# Patient Record
Sex: Female | Born: 1935 | Race: White | Hispanic: No | Marital: Married | Smoking: Never smoker
Health system: Southern US, Community
[De-identification: ages and names within clinical notes are randomized; demographics above are authoritative.]

## PROBLEM LIST (undated history)

## (undated) DIAGNOSIS — E785 Hyperlipidemia, unspecified: Secondary | ICD-10-CM

## (undated) DIAGNOSIS — I10 Essential (primary) hypertension: Secondary | ICD-10-CM

## (undated) DIAGNOSIS — S72143A Displaced intertrochanteric fracture of unspecified femur, initial encounter for closed fracture: Secondary | ICD-10-CM

## (undated) DIAGNOSIS — E079 Disorder of thyroid, unspecified: Secondary | ICD-10-CM

## (undated) HISTORY — DX: Hyperlipidemia, unspecified: E78.5

## (undated) HISTORY — PX: ORIF TIBIA & FIBULA FRACTURES: SHX2131

## (undated) HISTORY — PX: OTHER SURGICAL HISTORY: SHX169

## (undated) HISTORY — PX: PARTIAL HYSTERECTOMY: SHX80

## (undated) HISTORY — PX: FRACTURE SURGERY: SHX138

## (undated) HISTORY — PX: APPENDECTOMY: SHX54

## (undated) HISTORY — PX: TONSILLECTOMY AND ADENOIDECTOMY: SUR1326

## (undated) HISTORY — PX: SHOULDER ARTHROSCOPY: SHX128

## (undated) HISTORY — DX: Essential (primary) hypertension: I10

## (undated) HISTORY — PX: HEMORROIDECTOMY: SUR656

## (undated) HISTORY — DX: Disorder of thyroid, unspecified: E07.9

---

## 1999-07-12 ENCOUNTER — Encounter: Admission: RE | Admit: 1999-07-12 | Discharge: 1999-07-12 | Payer: Self-pay | Admitting: Cardiology

## 1999-11-08 ENCOUNTER — Emergency Department (HOSPITAL_COMMUNITY): Admission: EM | Admit: 1999-11-08 | Discharge: 1999-11-08 | Payer: Self-pay | Admitting: Emergency Medicine

## 1999-11-08 ENCOUNTER — Encounter: Payer: Self-pay | Admitting: Emergency Medicine

## 1999-11-14 ENCOUNTER — Encounter: Admission: RE | Admit: 1999-11-14 | Discharge: 1999-11-29 | Payer: Self-pay | Admitting: Orthopedic Surgery

## 2000-07-16 ENCOUNTER — Encounter: Payer: Self-pay | Admitting: Cardiology

## 2000-07-16 ENCOUNTER — Encounter: Admission: RE | Admit: 2000-07-16 | Discharge: 2000-07-16 | Payer: Self-pay | Admitting: Cardiology

## 2001-07-18 ENCOUNTER — Encounter: Payer: Self-pay | Admitting: Cardiology

## 2001-07-18 ENCOUNTER — Encounter: Admission: RE | Admit: 2001-07-18 | Discharge: 2001-07-18 | Payer: Self-pay | Admitting: Cardiology

## 2001-08-31 ENCOUNTER — Emergency Department (HOSPITAL_COMMUNITY): Admission: EM | Admit: 2001-08-31 | Discharge: 2001-08-31 | Payer: Self-pay | Admitting: Emergency Medicine

## 2001-09-07 ENCOUNTER — Encounter: Payer: Self-pay | Admitting: Neurology

## 2001-09-07 ENCOUNTER — Ambulatory Visit (HOSPITAL_COMMUNITY): Admission: RE | Admit: 2001-09-07 | Discharge: 2001-09-07 | Payer: Self-pay | Admitting: Neurology

## 2001-09-11 ENCOUNTER — Ambulatory Visit (HOSPITAL_COMMUNITY): Admission: RE | Admit: 2001-09-11 | Discharge: 2001-09-11 | Payer: Self-pay | Admitting: Neurology

## 2004-01-21 ENCOUNTER — Ambulatory Visit: Admission: RE | Admit: 2004-01-21 | Discharge: 2004-01-21 | Payer: Self-pay | Admitting: Plastic Surgery

## 2004-01-21 ENCOUNTER — Ambulatory Visit (HOSPITAL_BASED_OUTPATIENT_CLINIC_OR_DEPARTMENT_OTHER): Admission: RE | Admit: 2004-01-21 | Discharge: 2004-01-21 | Payer: Self-pay | Admitting: Plastic Surgery

## 2004-01-21 ENCOUNTER — Encounter (INDEPENDENT_AMBULATORY_CARE_PROVIDER_SITE_OTHER): Payer: Self-pay | Admitting: *Deleted

## 2004-05-16 ENCOUNTER — Encounter (INDEPENDENT_AMBULATORY_CARE_PROVIDER_SITE_OTHER): Payer: Self-pay | Admitting: Specialist

## 2004-05-16 ENCOUNTER — Ambulatory Visit (HOSPITAL_COMMUNITY): Admission: RE | Admit: 2004-05-16 | Discharge: 2004-05-16 | Payer: Self-pay | Admitting: Gastroenterology

## 2004-05-24 ENCOUNTER — Ambulatory Visit (HOSPITAL_COMMUNITY): Admission: RE | Admit: 2004-05-24 | Discharge: 2004-05-24 | Payer: Self-pay | Admitting: Plastic Surgery

## 2004-05-24 ENCOUNTER — Encounter (INDEPENDENT_AMBULATORY_CARE_PROVIDER_SITE_OTHER): Payer: Self-pay | Admitting: *Deleted

## 2004-05-24 ENCOUNTER — Ambulatory Visit (HOSPITAL_BASED_OUTPATIENT_CLINIC_OR_DEPARTMENT_OTHER): Admission: RE | Admit: 2004-05-24 | Discharge: 2004-05-24 | Payer: Self-pay | Admitting: Plastic Surgery

## 2004-08-02 ENCOUNTER — Encounter: Admission: RE | Admit: 2004-08-02 | Discharge: 2004-08-02 | Payer: Self-pay | Admitting: Cardiology

## 2004-08-10 ENCOUNTER — Encounter: Admission: RE | Admit: 2004-08-10 | Discharge: 2004-08-10 | Payer: Self-pay | Admitting: Cardiology

## 2004-08-10 ENCOUNTER — Encounter (INDEPENDENT_AMBULATORY_CARE_PROVIDER_SITE_OTHER): Payer: Self-pay | Admitting: *Deleted

## 2004-08-10 ENCOUNTER — Other Ambulatory Visit: Admission: RE | Admit: 2004-08-10 | Discharge: 2004-08-10 | Payer: Self-pay | Admitting: Diagnostic Radiology

## 2004-08-22 ENCOUNTER — Ambulatory Visit: Admission: RE | Admit: 2004-08-22 | Discharge: 2004-08-22 | Payer: Self-pay | Admitting: Orthopedic Surgery

## 2005-09-05 ENCOUNTER — Encounter: Admission: RE | Admit: 2005-09-05 | Discharge: 2005-09-05 | Payer: Self-pay | Admitting: Cardiology

## 2005-09-28 ENCOUNTER — Encounter: Admission: RE | Admit: 2005-09-28 | Discharge: 2005-09-28 | Payer: Self-pay | Admitting: Cardiology

## 2007-08-01 ENCOUNTER — Encounter: Admission: RE | Admit: 2007-08-01 | Discharge: 2007-08-01 | Payer: Self-pay | Admitting: Cardiology

## 2007-08-30 ENCOUNTER — Encounter: Admission: RE | Admit: 2007-08-30 | Discharge: 2007-08-30 | Payer: Self-pay | Admitting: Cardiology

## 2008-12-17 ENCOUNTER — Encounter: Payer: Self-pay | Admitting: Cardiology

## 2009-01-06 ENCOUNTER — Ambulatory Visit: Payer: Self-pay | Admitting: Infectious Diseases

## 2009-01-06 ENCOUNTER — Inpatient Hospital Stay (HOSPITAL_COMMUNITY): Admission: EM | Admit: 2009-01-06 | Discharge: 2009-01-08 | Payer: Self-pay | Admitting: Emergency Medicine

## 2009-01-07 ENCOUNTER — Encounter: Payer: Self-pay | Admitting: Infectious Diseases

## 2009-01-07 ENCOUNTER — Ambulatory Visit: Payer: Self-pay | Admitting: Surgery

## 2009-02-21 ENCOUNTER — Encounter: Payer: Self-pay | Admitting: Infectious Diseases

## 2009-11-08 ENCOUNTER — Encounter: Admission: RE | Admit: 2009-11-08 | Discharge: 2009-11-08 | Payer: Self-pay | Admitting: Cardiology

## 2009-11-12 ENCOUNTER — Encounter: Admission: RE | Admit: 2009-11-12 | Discharge: 2009-11-12 | Payer: Self-pay | Admitting: Cardiology

## 2010-06-08 ENCOUNTER — Ambulatory Visit: Payer: Self-pay | Admitting: Cardiology

## 2010-10-06 ENCOUNTER — Inpatient Hospital Stay (HOSPITAL_COMMUNITY)
Admission: EM | Admit: 2010-10-06 | Discharge: 2010-10-08 | Payer: Self-pay | Source: Home / Self Care | Attending: Internal Medicine | Admitting: Internal Medicine

## 2010-10-06 LAB — CBC
HCT: 41.5 % (ref 36.0–46.0)
Hemoglobin: 13.4 g/dL (ref 12.0–15.0)
MCH: 30.2 pg (ref 26.0–34.0)
MCHC: 32.3 g/dL (ref 30.0–36.0)
MCV: 93.5 fL (ref 78.0–100.0)
Platelets: 216 10*3/uL (ref 150–400)
RBC: 4.44 MIL/uL (ref 3.87–5.11)
RDW: 13.6 % (ref 11.5–15.5)
WBC: 7.3 10*3/uL (ref 4.0–10.5)

## 2010-10-06 LAB — POCT CARDIAC MARKERS
CKMB, poc: <1 ng/mL — ABNORMAL LOW (ref 1.0–8.0)
Myoglobin, poc: 61.2 ng/mL (ref 12–200)
Troponin i, poc: 0.18 ng/mL — ABNORMAL HIGH (ref 0.00–0.09)

## 2010-10-06 LAB — URINALYSIS, ROUTINE W REFLEX MICROSCOPIC
Bilirubin Urine: NEGATIVE
Hemoglobin, Urine: NEGATIVE
Ketones, ur: NEGATIVE mg/dL
Nitrite: NEGATIVE
Protein, ur: NEGATIVE mg/dL
Specific Gravity, Urine: 1.017 (ref 1.005–1.030)
Urine Glucose, Fasting: NEGATIVE mg/dL
Urobilinogen, UA: 0.2 mg/dL (ref 0.0–1.0)
pH: 6 (ref 5.0–8.0)

## 2010-10-06 LAB — POCT I-STAT, CHEM 8
BUN: 21 mg/dL (ref 6–23)
Calcium, Ion: 1.2 mmol/L (ref 1.12–1.32)
Chloride: 106 mEq/L (ref 96–112)
Creatinine, Ser: 0.8 mg/dL (ref 0.4–1.2)
Glucose, Bld: 101 mg/dL — ABNORMAL HIGH (ref 70–99)
HCT: 43 % (ref 36.0–46.0)
Hemoglobin: 14.6 g/dL (ref 12.0–15.0)
Potassium: 3.8 mEq/L (ref 3.5–5.1)
Sodium: 143 mEq/L (ref 135–145)
TCO2: 30 mmol/L (ref 0–100)

## 2010-10-06 LAB — DIFFERENTIAL
Basophils Absolute: 0 10*3/uL (ref 0.0–0.1)
Basophils Relative: 0 % (ref 0–1)
Eosinophils Absolute: 0.3 10*3/uL (ref 0.0–0.7)
Eosinophils Relative: 4 % (ref 0–5)
Lymphocytes Relative: 27 % (ref 12–46)
Lymphs Abs: 2 10*3/uL (ref 0.7–4.0)
Monocytes Absolute: 0.8 10*3/uL (ref 0.1–1.0)
Monocytes Relative: 11 % (ref 3–12)
Neutro Abs: 4.1 10*3/uL (ref 1.7–7.7)
Neutrophils Relative %: 57 % (ref 43–77)

## 2010-10-06 LAB — PROTIME-INR
INR: 0.98 (ref 0.00–1.49)
Prothrombin Time: 13.2 seconds (ref 11.6–15.2)

## 2010-10-07 ENCOUNTER — Encounter (INDEPENDENT_AMBULATORY_CARE_PROVIDER_SITE_OTHER): Payer: Self-pay | Admitting: Internal Medicine

## 2010-10-07 LAB — DIFFERENTIAL
Basophils Absolute: 0 10*3/uL (ref 0.0–0.1)
Basophils Relative: 0 % (ref 0–1)
Eosinophils Absolute: 0.2 10*3/uL (ref 0.0–0.7)
Eosinophils Relative: 3 % (ref 0–5)
Lymphocytes Relative: 23 % (ref 12–46)
Lymphs Abs: 1.8 10*3/uL (ref 0.7–4.0)
Monocytes Absolute: 0.6 10*3/uL (ref 0.1–1.0)
Monocytes Relative: 8 % (ref 3–12)
Neutro Abs: 5 10*3/uL (ref 1.7–7.7)
Neutrophils Relative %: 65 % (ref 43–77)

## 2010-10-07 LAB — CBC
HCT: 38.6 % (ref 36.0–46.0)
Hemoglobin: 12.5 g/dL (ref 12.0–15.0)
MCH: 30.4 pg (ref 26.0–34.0)
MCHC: 32.4 g/dL (ref 30.0–36.0)
MCV: 93.9 fL (ref 78.0–100.0)
Platelets: 204 10*3/uL (ref 150–400)
RBC: 4.11 MIL/uL (ref 3.87–5.11)
RDW: 13.7 % (ref 11.5–15.5)
WBC: 7.7 10*3/uL (ref 4.0–10.5)

## 2010-10-07 LAB — COMPREHENSIVE METABOLIC PANEL
ALT: 11 U/L (ref 0–35)
AST: 18 U/L (ref 0–37)
Albumin: 2.7 g/dL — ABNORMAL LOW (ref 3.5–5.2)
Alkaline Phosphatase: 78 U/L (ref 39–117)
BUN: 15 mg/dL (ref 6–23)
CO2: 22 mEq/L (ref 19–32)
Calcium: 8.6 mg/dL (ref 8.4–10.5)
Chloride: 110 mEq/L (ref 96–112)
Creatinine, Ser: 0.61 mg/dL (ref 0.4–1.2)
GFR calc Af Amer: >60 mL/min (ref >60)
GFR calc non Af Amer: >60 mL/min (ref >60)
Glucose, Bld: 101 mg/dL — ABNORMAL HIGH (ref 70–99)
Potassium: 4.1 mEq/L (ref 3.5–5.1)
Sodium: 142 mEq/L (ref 135–145)
Total Bilirubin: 0.5 mg/dL (ref 0.3–1.2)
Total Protein: 5.6 g/dL — ABNORMAL LOW (ref 6.0–8.3)

## 2010-10-07 LAB — CK TOTAL AND CKMB (NOT AT ARMC)
CK, MB: 1 ng/mL (ref 0.3–4.0)
Relative Index: INVALID (ref 0.0–2.5)
Total CK: 31 U/L (ref 7–177)

## 2010-10-07 LAB — CARDIAC PANEL(CRET KIN+CKTOT+MB+TROPI)
CK, MB: 1 ng/mL (ref 0.3–4.0)
CK, MB: 1.2 ng/mL (ref 0.3–4.0)
Relative Index: INVALID (ref 0.0–2.5)
Relative Index: INVALID (ref 0.0–2.5)
Total CK: 31 U/L (ref 7–177)
Total CK: 30 U/L (ref 7–177)
Troponin I: 0.01 ng/mL (ref 0.00–0.06)
Troponin I: 0.01 ng/mL (ref 0.00–0.06)

## 2010-10-07 LAB — MAGNESIUM: Magnesium: 2.1 mg/dL (ref 1.5–2.5)

## 2010-10-07 LAB — TSH: TSH: 6.174 u[IU]/mL — ABNORMAL HIGH (ref 0.350–4.500)

## 2010-10-07 LAB — LIPASE, BLOOD: Lipase: 135 U/L — ABNORMAL HIGH (ref 11–59)

## 2010-10-07 LAB — LIPID PANEL
Cholesterol: 143 mg/dL (ref 0–200)
HDL: 35 mg/dL — ABNORMAL LOW (ref >39)
LDL Cholesterol: 92 mg/dL (ref 0–99)
Total CHOL/HDL Ratio: 4.1 RATIO
Triglycerides: 78 mg/dL (ref <150)
VLDL: 16 mg/dL (ref 0–40)

## 2010-10-07 LAB — TROPONIN I: Troponin I: 0.02 ng/mL (ref 0.00–0.06)

## 2010-10-07 LAB — PHOSPHORUS: Phosphorus: 2.9 mg/dL (ref 2.3–4.6)

## 2010-10-07 LAB — T4, FREE: Free T4: 0.72 ng/dL — ABNORMAL LOW (ref 0.80–1.80)

## 2010-10-11 ENCOUNTER — Ambulatory Visit: Payer: Self-pay | Admitting: Cardiology

## 2010-10-17 LAB — AMYLASE: Amylase: 82 U/L (ref 0–105)

## 2010-10-17 LAB — CBC
HCT: 39.2 % (ref 36.0–46.0)
Hemoglobin: 12.5 g/dL (ref 12.0–15.0)
MCH: 29.8 pg (ref 26.0–34.0)
MCHC: 31.9 g/dL (ref 30.0–36.0)
MCV: 93.3 fL (ref 78.0–100.0)
Platelets: 198 10*3/uL (ref 150–400)
RBC: 4.2 MIL/uL (ref 3.87–5.11)
RDW: 13.8 % (ref 11.5–15.5)
WBC: 8.4 10*3/uL (ref 4.0–10.5)

## 2010-10-17 LAB — CARDIAC PANEL(CRET KIN+CKTOT+MB+TROPI)
CK, MB: 1.1 ng/mL (ref 0.3–4.0)
CK, MB: 1.2 ng/mL (ref 0.3–4.0)
Relative Index: INVALID (ref 0.0–2.5)
Relative Index: 1 (ref 0.0–2.5)
Total CK: 26 U/L (ref 7–177)
Total CK: 122 U/L (ref 7–177)
Troponin I: 0.01 ng/mL (ref 0.00–0.06)
Troponin I: 0.02 ng/mL (ref 0.00–0.06)

## 2010-10-17 LAB — COMPREHENSIVE METABOLIC PANEL
ALT: 13 U/L (ref 0–35)
AST: 15 U/L (ref 0–37)
Albumin: 3 g/dL — ABNORMAL LOW (ref 3.5–5.2)
Alkaline Phosphatase: 84 U/L (ref 39–117)
BUN: 7 mg/dL (ref 6–23)
CO2: 23 mEq/L (ref 19–32)
Calcium: 8.5 mg/dL (ref 8.4–10.5)
Chloride: 104 mEq/L (ref 96–112)
Creatinine, Ser: 0.53 mg/dL (ref 0.4–1.2)
GFR calc Af Amer: >60 mL/min (ref >60)
GFR calc non Af Amer: >60 mL/min (ref >60)
Glucose, Bld: 110 mg/dL — ABNORMAL HIGH (ref 70–99)
Potassium: 3.2 mEq/L — ABNORMAL LOW (ref 3.5–5.1)
Sodium: 135 mEq/L (ref 135–145)
Total Bilirubin: 0.5 mg/dL (ref 0.3–1.2)
Total Protein: 6.4 g/dL (ref 6.0–8.3)

## 2010-10-17 LAB — URINE CULTURE
Colony Count: NO GROWTH
Culture  Setup Time: 201201052014
Culture: NO GROWTH

## 2010-10-17 LAB — LIPASE, BLOOD: Lipase: 28 U/L (ref 11–59)

## 2010-10-23 ENCOUNTER — Encounter: Payer: Self-pay | Admitting: Cardiology

## 2010-10-27 NOTE — H&P (Addendum)
NAME:  Lauren Moreno, Lauren Moreno NO.:  [id number]  MEDICAL RECORD NO.:  [id number]          PATIENT TYPE:  EMS  LOCATION:  MAJO                         FACILITY:  [facility]  PHYSICIAN:  Rock Nephew, MD       DATE OF BIRTH:  07-20-1928  DATE OF ADMISSION:  10/06/2010 DATE OF DISCHARGE:                             HISTORY & PHYSICAL   PRIMARY CARE PHYSICIAN AND CARDIOLOGIST:  Dr. Cassell Clement.  CHIEF COMPLAINT:  Fall.  HISTORY OF PRESENT ILLNESS:  An 77 year old female comes in with a chief complaint of fall.  This is an 77 year old female history of syncope as well as angina per patient and a history of vertigo comes in with chief complaint of a fall.  The patient reports that she was walking from one room to the next and she lost her balance and she fell.  The patient reported that she felt lightheaded before she fell.  She denied any unusual chest pain or shortness of breath before falling.  The patientrecently had has had some abdominal pain and nausea.  She denies any fevers or chills.  The patient did report to me that she had some chest pain with no shortness breath, no nausea, and no left arm radiation. The patient reports that she normally has chest pain and she has a history of angina.  Also of note, the patient had a CT scan of the head and C-spine . CT of the head was no intracranial hemorrhage and no CT evidence of large acute infarct.  CT of the C-spine showed no cervical spine fracture, scattered mild degenerative changes, most notably C5-C6. The patient also had two-view abdominal x-ray, which showed nonspecific nonobstructive gas bowel pattern.  PAST MEDICAL HISTORY:  History of syncope, history of angina per patient.  SURGICAL HISTORY:  History of partial hysterectomy, history of appendectomy with a partial hysterectomy.  SOCIAL HISTORY:  Nonsmoker, non drinker.  No drug abuse.  The patient lives by herself.  The patient's healthcare decision maker  is his her son and daughter both.  The spokesperson is going to be Gene Bushnell who is the son, phone number [phone].  The patient reports that she has not fallen in the last 6 months.  ALLERGIES:  PENICILLIN.  The patient reported shortness of breath with PENICILLIN as allergy.  HOME MEDICATION:  She reports she takes: 1. Inderal. 2. As well as aspirin 325 mg.  REVIEW OF SYSTEMS:  No headaches.  The patient reports blurry vision. She reports chest pain.  She denies shortness of breath.  She denies any nausea right now but she had nausea the past.  She denies vomiting.  She denies abdominal pain right now but she has had abdominal pain in the past last 2-3 days.  She denied any diarrhea, constipation, burning on urination.  She reports occasional pain in her legs but not right now. Denies having any seizure-like activity or stroke-like activity.  PHYSICAL EXAMINATION:  VITAL SIGNS:  Temperature 97.0, blood pressure 141/54, pulse rate 70, respiratory rate 18, 100% saturation 2 liters nasal cannula. HEAD, EYES, EARS, NOSE AND THROAT:  Normocephalic, atraumatic.  Pupils equal,  round, reactive to light. CARDIOVASCULAR:  S1, S2.  Regular rate rhythm.  No murmurs or rubs. LUNGS:  Clear to auscultation bilaterally.  No wheezes or rhonchi. ABDOMEN:  Soft, nontender, nondistended.  Bowel sounds positive.  No guarding or rebound tenderness. EXTREMITIES:  No lower extremity edema is evident. NEUROLOGIC:  The patient's cranial nerves II-XII are grossly intact.  DIAGNOSTIC STUDIES:  The patient's 12-lead EKG shows normal sinus rhythm and no acute ST-T wave changes.  RADIOLOGICAL STUDIES:  Two-view abdominal x-rays nonspecific nonobstructive bowel gas pattern.  The patient's chest x-ray two-view shows stable mild bibasilar scarring and old lower thoracic vertebral body compression fractures.  The patient's x-ray of the pelvis shows a negative.  X-ray of the left femur was negative.   X-ray of the right femur was negative.  X-ray of the left ankle was negative.  X-ray of the right ankle was negative.  The patient's two-view x-ray which is nonspecific nonobstructive gas bowel pattern.  CT of the C-spine showed no cervical spine fracture, scattered mild degenerative changes most notable at C5-C6.  CT of the head which showed no acute intracranial hemorrhage or CT evidence of large acute infarct.  LABORATORY STUDIES:  Are as follows.  The patient WBC count is 7.3, hemoglobin is 14.6, hematocrit 43.0, MCV 93.5, platelets are 216,000, neutrophils 57, INR 0.98, sodium 143, potassium 3.9, chloride 106, bicarbonate is 30, BUN 21, creatinine 0.8, glucose 101, troponin-I point- of-care marker is 0.18.  IMPRESSION AND PLAN:  This is an 77 year old female admitted for a fall.  PROBLEM LIST: 1. Fall.  Fall etiology is not really clear.  The patient has a     history of vertiginous symptoms in the past.  She takes propranolol     for that.  Other etiology for the fall and lightheadedness could be     dehydration.  It is unlikely the patient had a any kind of stroke     or seizure.  The patient will be admitted to telemetry bed.  Will     monitor the patient on telemetry to make sure the patient has no     arrhythmias.  Also get 2-D echocardiogram. 2. Angina, chest pain.  The patient reports that she has a history of     angina.  However, she has never had a cardiac catheterization per     patient.  I did discuss the case with Dr. Marca Ancona, as he was     covering for Dr. [name].  He said that he looked at the EKG and     did not think there was any concern from the EKG standpoint.  He     recommended to cycle the patient's cardiac enzymes and if the     cardiac enzymes start trending up, to give them a call.  He also     recommended 2-D echocardiogram might be worthwhile.  Elevated     troponin and we will monitor. 3. History of vertiginous symptoms.  We will monitor the  patient, will     do orthostatics, and will await pharmacy to do the med rec. 4. History of abdominal pain.  Nonspecific bowel gas pattern will     check a CMET in the morning with a lipase and will also check a two-     view abdominal x-ray in the morning.  The patient's bicarbonate is     30, so elevation     in the lactate is highly unlikely. 5. The patient is  a Do Not Resuscitate, which we will honor.  Please note that this is not an official document until it is electronically signed.     Rock Nephew, MD     NH/MEDQ  D:  10/06/2010  T:  10/07/2010  Job:  [id number]  cc:   Cassell Clement, M.D.  Electronically Signed by Rock Nephew MD on 10/27/2010 10:17:50 AM

## 2010-12-12 ENCOUNTER — Other Ambulatory Visit: Payer: Self-pay | Admitting: Family Medicine

## 2010-12-20 ENCOUNTER — Ambulatory Visit (HOSPITAL_BASED_OUTPATIENT_CLINIC_OR_DEPARTMENT_OTHER)
Admission: RE | Admit: 2010-12-20 | Discharge: 2010-12-20 | Disposition: A | Discharge status: Home / Self Care | Payer: Medicare Other | Source: Ambulatory Visit | Attending: Urology | Admitting: Urology

## 2010-12-20 DIAGNOSIS — Z01812 Encounter for preprocedural laboratory examination: Secondary | ICD-10-CM | POA: Insufficient documentation

## 2010-12-20 DIAGNOSIS — N3642 Intrinsic sphincter deficiency (ISD): Secondary | ICD-10-CM | POA: Insufficient documentation

## 2010-12-20 DIAGNOSIS — N393 Stress incontinence (female) (male): Secondary | ICD-10-CM | POA: Insufficient documentation

## 2010-12-20 DIAGNOSIS — I10 Essential (primary) hypertension: Secondary | ICD-10-CM | POA: Insufficient documentation

## 2010-12-20 DIAGNOSIS — I2510 Atherosclerotic heart disease of native coronary artery without angina pectoris: Secondary | ICD-10-CM | POA: Insufficient documentation

## 2010-12-20 DIAGNOSIS — R42 Dizziness and giddiness: Secondary | ICD-10-CM | POA: Insufficient documentation

## 2010-12-20 DIAGNOSIS — Z79899 Other long term (current) drug therapy: Secondary | ICD-10-CM | POA: Insufficient documentation

## 2010-12-20 DIAGNOSIS — I519 Heart disease, unspecified: Secondary | ICD-10-CM | POA: Insufficient documentation

## 2010-12-20 LAB — POCT I-STAT 4, (NA,K, GLUC, HGB,HCT)
Glucose, Bld: 100 mg/dL — ABNORMAL HIGH (ref 70–99)
HCT: 45 % (ref 36.0–46.0)

## 2010-12-21 NOTE — Op Note (Signed)
  NAME:  Lauren Moreno, Lauren Moreno NO.:  [id number]  MEDICAL RECORD NO.:  [id number]           PATIENT TYPE:  LOCATION:                                 FACILITY:  PHYSICIAN:  Excell Seltzer. Annabell Howells, M.D.    DATE OF BIRTH:  Nov 02, 1927  DATE OF PROCEDURE:  12/20/2010 DATE OF DISCHARGE:                              OPERATIVE REPORT   PROCEDURE:  Macroplastique injection.  PREOPERATIVE DIAGNOSIS:  Intrinsic sphincter deficiency.  POSTOPERATIVE DIAGNOSIS:  Intrinsic sphincter deficiency.  SURGEON:  Excell Seltzer. Annabell Howells, MD  ANESTHESIA:  MAC.  DRAINS:  None.  COMPLICATIONS:  None.  INDICATIONS:  Ms. Gassner is an 77 year old white female with incontinence related to intrinsic sphincter deficiency.  She has elected Macroplastique injection for further treatment.  FINDINGS AND PROCEDURE:  She was given Cipro.  She was taken to the operating room where she was given sedation.  She was placed in lithotomy position.  Her genitalia was prepped with chlorhexidine solution and she was draped in usual sterile fashion.  The injection scope was prepared with the 12-degree lens and the Macroplastique needle.  An initial syringe was secured and the needle was primed.  The scope was inserted.  Inspection revealed an open bladder neck, but otherwise normal urethra.  The bladder had mild trabeculation.  No tumors or stones were noted.  Ureteral orifices were unremarkable.  The bladder was drained.  The needle was then advanced into the right midurethra at a 45-degree angle and then turned parallel after the first mark.  Approximately 1.25 cc of Macroplastique was injected with excellent bulging of the mucosa to the middle at the level of the proximal urethra.  Once the half syringe had been injected, the needle was held in position for 30 seconds and then removed.  No leakage of material was noted.  A second injection was then performed in the left mid urethra at approximately 3 o'clock, once  again with excellent bulging of the mucosa with coaptation in the midline.  The remaining material from the initial 2.5 cc syringe was injected.  The needle was held for 30 seconds and then removed.  At this point, inspection of the urethra demonstrated excellent coaptation of the mucosa in the midline and it was not felt that a second syringe was indicated at this point in time.  The patient's bladder was partially drained with a 14-French red rubber catheter, but then she was asked to cough and strain and pressure was placed on her bladder without evidence of leakage from the urethra.  She was taken down from lithotomy position and moved to recovery room in stable condition.  There were no complications.  She will be sent home with prescription for Cipro and has a followup with me on January 02, 2011.     Excell Seltzer. Annabell Howells, M.D.     JJW/MEDQ  D:  12/20/2010  T:  12/20/2010  Job:  [id number]  cc:   Cassell Clement, M.D. Fax: [phone]  Electronically Signed by Bjorn Pippin M.D. on 12/21/2010 04:51:38 PM

## 2010-12-28 ENCOUNTER — Ambulatory Visit
Admission: RE | Admit: 2010-12-28 | Discharge: 2010-12-28 | Disposition: A | Discharge status: Home / Self Care | Payer: Medicare Other | Source: Ambulatory Visit | Attending: Family Medicine | Admitting: Family Medicine

## 2011-01-11 LAB — CBC
HCT: 38.9 % (ref 36.0–46.0)
MCHC: 35.3 g/dL (ref 30.0–36.0)
MCV: 91.5 fL (ref 78.0–100.0)
Platelets: 187 10*3/uL (ref 150–400)
RBC: 4.72 MIL/uL (ref 3.87–5.11)
RDW: 13.4 % (ref 11.5–15.5)
WBC: 9.8 10*3/uL (ref 4.0–10.5)

## 2011-01-11 LAB — BASIC METABOLIC PANEL
BUN: 13 mg/dL (ref 6–23)
Calcium: 9.1 mg/dL (ref 8.4–10.5)
Creatinine, Ser: 0.74 mg/dL (ref 0.4–1.2)
GFR calc Af Amer: >60 mL/min (ref >60)
GFR calc non Af Amer: >60 mL/min (ref >60)
GFR calc non Af Amer: >60 mL/min (ref >60)
Potassium: 3.8 mEq/L (ref 3.5–5.1)

## 2011-01-11 LAB — DIFFERENTIAL
Basophils Relative: 0 % (ref 0–1)
Lymphs Abs: 1.1 10*3/uL (ref 0.7–4.0)
Monocytes Relative: 6 % (ref 3–12)
Neutro Abs: 7.9 10*3/uL — ABNORMAL HIGH (ref 1.7–7.7)
Neutrophils Relative %: 81 % — ABNORMAL HIGH (ref 43–77)

## 2011-01-11 LAB — HEPATIC FUNCTION PANEL
ALT: 13 U/L (ref 0–35)
AST: 19 U/L (ref 0–37)
Indirect Bilirubin: 0.7 mg/dL (ref 0.3–0.9)
Total Protein: 6.6 g/dL (ref 6.0–8.3)

## 2011-01-11 LAB — TSH: TSH: 1.893 u[IU]/mL (ref 0.350–4.500)

## 2011-01-11 LAB — TROPONIN I: Troponin I: <0.01 ng/mL (ref 0.00–0.06)

## 2011-01-11 LAB — CARDIAC PANEL(CRET KIN+CKTOT+MB+TROPI)
CK, MB: 1.4 ng/mL (ref 0.3–4.0)
CK, MB: 1.5 ng/mL (ref 0.3–4.0)
Relative Index: INVALID (ref 0.0–2.5)
Total CK: 40 U/L (ref 7–177)
Troponin I: <0.01 ng/mL (ref 0.00–0.06)
Troponin I: <0.01 ng/mL (ref 0.00–0.06)

## 2011-01-11 LAB — POCT CARDIAC MARKERS
CKMB, poc: <1 ng/mL — ABNORMAL LOW (ref 1.0–8.0)
Myoglobin, poc: 108 ng/mL (ref 12–200)
Myoglobin, poc: 95.2 ng/mL (ref 12–200)

## 2011-01-11 LAB — BRAIN NATRIURETIC PEPTIDE: Pro B Natriuretic peptide (BNP): <30 pg/mL (ref 0.0–100.0)

## 2011-01-11 LAB — CK TOTAL AND CKMB (NOT AT ARMC): Relative Index: INVALID (ref 0.0–2.5)

## 2011-01-11 LAB — MAGNESIUM: Magnesium: 2.1 mg/dL (ref 1.5–2.5)

## 2011-02-14 NOTE — Discharge Summary (Signed)
NAME:  Lauren Moreno, EWING NO.:  [id number]   MEDICAL RECORD NO.:  [id number]          PATIENT TYPE:  INP   LOCATION:  3735                         FACILITY:  [facility]   PHYSICIAN:  Fransisco Hertz, M.D.  DATE OF BIRTH:  1928/02/19   DATE OF ADMISSION:  01/06/2009  DATE OF DISCHARGE:  01/08/2009                               DISCHARGE SUMMARY   DISCHARGE DIAGNOSES:  1. Syncopal episode of unknown etiology, ruled out for acute coronary      syndrome, negative head CT, most likely etiology vasovagal reaction      with a possible component of orthostasis.  2. Chronic chest pain, followed by Dr. Patty Moreno.  3. History of falls and instability on feet.  4. History of squamous cell skin cancer status post excision in 2005.  5. Torn cartilage in bilateral knees status post cortisone injections.   DISCHARGE MEDICATIONS:  1. Inderal 40 mg p.o. b.i.d.  2. Multivitamin take once daily.  3. Aspirin 81 mg p.o. daily.  4. Advair use as directed.   Please note that the medications that the patient was discharged to on  were the same as her admission medications.  No changes were made.   CONDITION ON DISCHARGE:  The patient had been monitored in the hospital  for 48 hours and did not have any events on telemetry.  She had been  ruled out for acute coronary artery syndrome and had a negative head CT,  and had no focal neurologic symptoms making a CVA very unlikely.  The  most likely cause of the patient's syncopal episode was vasovagal  response.  The patient is to follow up with her PCP, Dr. Patty Moreno of  [facility] and we scheduled an appointment for her to be seen  on January 13, 2009, at 1:30 p.m.  She was also evaluated by Physical  Therapy and given her general instability on her feet, she was provided  with a walker upon discharge in the hopes of reducing her falls risk.   CONSULTATIONS:  None.   IMAGING:  1. Head CT done on January 06, 2009, shows no acute  intracranial      abnormality.  2. Chest x-ray done on January 07, 2009, shows no acute cardiopulmonary      findings.  3. Two-D echo done on January 07, 2009, shows overall left ventricular      systolic function was normal.  The left ventricular ejection      fraction was estimated to be between 55-60%.  There was no left      ventricular regional wall motion abnormalities.  Left ventricular      diastolic function parameters were within normal limits.  Aortic      valve thickness was mildly increased.  There was trivial aortic      valvular regurgitation.  There was mild mitral valvular      regurgitation.   HISTORY AND PHYSICAL:  The patient is a 77 year old woman with past  medical history significant for occasional chest pains status post  recent Myoview few weeks back that was normal and some orthopedic  conditions,  who was brought by EMS to the ED as the patient lost  consciousness in the morning.  The patient completed her breakfast and  was sitting in a chair and she felt weak and thought that she might  faint.  She slumped over in the chair.  She had no fall on the day of  admission.  No history of injury or any pain after she slumped over.  She denies any chest pains, shortness of breath, visual symptoms,  headache, or heart palpitations.  She has no abnormal tonic-clonic type  symptoms.  The patient lost consciousness for about 10 minutes and then  was confused for about 5 minutes after the episode.  She denies any  fevers or chills.  She has had some cough at baseline, but not getting  worse.  Per her husband, the patient was noticed to be pale during the  episode.  She had no nausea, vomiting, or diaphoresis.  No recent long-  distance travel or recent surgery.  The patient was dizzy 1 day prior to  admission and she took some Benadryl 1 day prior to admission for  allergy, otherwise no new meds.   PHYSICAL EXAMINATION:  VITAL SIGNS:  Temperature 97.6, blood pressure   128/36, pulse of 139, respiratory rate of 14, O2 sat of 100% on room  air.  GENERAL:  No apparent distress.  HEENT:  Eyes, PERRLA, EOMI.  ENT, pink and moist oropharynx.  NECK:  No JVD or carotid bruits.  RESPIRATORY:  Bilateral clear to auscultation.  No crackles or wheezes.  CARDIOVASCULAR:  Normal S1 and S2, regular rate and rhythm.  No murmurs,  rubs, or gallops.  GI:  Bowel sounds are normoactive, soft, nontender, nondistended.  EXTREMITIES:  No pedal edema, bilateral calf tenderness, but the  patient's family says this is her baseline.  NEUROLOGIC:  Alert and oriented x3, 5/5 bilateral upper and lower  extremity strength.  Cerebellar function intact.  Gait deferred  secondary to the patient feeling a little weak.   INITIAL LABORATORY DATA:  Sodium 140, potassium 3.8, chloride of 106,  bicarbonate 25, BUN of 20, creatinine of 0.74, glucose of 140.  White  blood cell count 9.8, hemoglobin 15, platelets of 213, MCV of 91.7.  BNP  less than 30.  The patient did have a mildly elevated D-dimer at 0.56.  However, her Wells score was 0, and she was sating 100% on room air.  For this reason, a chest CTA was not performed.   HOSPITAL COURSE:  1. Syncopal episode:  The differential for this patient's syncopal      episode was broad including cardiac, neurologic, vasovagal, and      orthostasis.  The patient was ruled out for acute coronary syndrome      with negative cardiac enzymes x3, and an initial EKG that only      showed poor R-wave progression, which was consistent with prior      EKGs.  The patient was also monitored on telemetry for 48 hours to      evaluate for any arrhythmias.  There were no arrhythmias or ectopy      during that 48-hour monitoring.  The patient was also evaluated for      a neurologic cause with a full physical exam as well as a head CT.      Both of these were unremarkable.  The patient had a set of      orthostatic vital signs, which did show mild  orthostasis with a      drop in blood pressure by about 15 and increase in her heart rate      by about 15.  She had not had a whole lot of fluid before the      syncopal episodes, so this could be a contributing factor.  The      most likely explanation for the patient's syncopal episode was      vasovagal response.  It appears that at her baseline, she is quite      anxious and along with having just had a big meal and perhaps being      a bit dehydrated, just was vasovagal and had a syncopal episode.  2. Chronic chest pain:  The patient had a recent Myoview by Dr.      Patty Moreno that showed no ischemia and we also performed an echo on      this hospitalization that did not show any significant      abnormalities other than mild mitral valve regurgitation.  Again,      the patient had a repeat EKGs, all of which did not show any signs      of ST changes.  She does have the poor R-wave progression, but this      is chronic for her.  3. Increased blood sugar on admission:  Hemoglobin A1c was checked,      which was 5.6, and the patient does not have any history of      diabetes.   DISCHARGE VITALS:  Temperature 97.5, blood pressure 118/66.  The patient  is sating at 95% on room air, pulse 75, respiratory rate of 20.   DISCHARGE LABORATORIES:  Sodium of 142, potassium 3.8, chloride of 114,  bicarb 24, glucose 94, BUN of 13, creatinine of 0.58, calcium of 8.6.  White blood cell count of 7.5, hemoglobin 13.7, platelet count 187.   PENDING LABORATORIES:  There are no pending labs at this time.      Linward Foster, MD  Electronically Signed      Fransisco Hertz, M.D.  Electronically Signed    LW/MEDQ  D:  01/08/2009  T:  01/09/2009  Job:  [id number]   cc:   Cassell Clement, M.D.

## 2011-02-17 NOTE — Procedures (Signed)
[place]  Patient:    HILDY, NICHOLL Visit Number: [id number] MRN: [id number]          Service Type: OUT Location: [facility] Attending Physician:  Erich Montane Dictated by:   Genene Churn. Love, M.D. Proc. Date: 09/11/01 Admit Date:  09/11/2001                             Procedure Report  PROCEDURE PERFORMED:  Lumbar puncture.  DATE OF BIRTH:  22-Apr-1928  INDICATIONS FOR PROCEDURE:  The patient has had double vision and headaches and is being evaluated for the possibility of subarachnoid hemorrhage.  DESCRIPTION OF PROCEDURE:  The patient was prepped and draped in left lateral decubitus position using Betadine and 1% Xylocaine.  The L3-4 interspace was entered without difficulty.  Opening pressure was 220 mH2O and the patient was slightly tense.  Clear colorless CSF was obtained and sent for studies including VDRL, protein, glucose, cell count, diff and to be held. Dictated by:   Genene Churn. Love, M.D. Attending Physician:  Erich Montane DD:  09/11/01 TD:  09/11/01 Job: [id number]

## 2011-02-17 NOTE — Op Note (Signed)
NAME:  Lauren Moreno, HUSTEAD NO.:  [id number]   MEDICAL RECORD NO.:  [id number]                   PATIENT TYPE:  AMB   LOCATION:  DSC                                  FACILITY:  [facility]   PHYSICIAN:  Consuello Bossier., M.D.         DATE OF BIRTH:  1928-03-14   DATE OF PROCEDURE:  05/24/2004  DATE OF DISCHARGE:                                 OPERATIVE REPORT   PREOPERATIVE DIAGNOSIS:  Squamous cell carcinoma in situ, right cheek, 1 cm,  with resulting 2.5 cm complex wound closure.   POSTOPERATIVE DIAGNOSIS:  Squamous cell carcinoma in situ, right cheek, 1  cm, with resulting 2.5 cm complex wound closure.   SURGEON:  Pleas Patricia, M.D.   ANESTHESIA:  Xylocaine 2% with epinephrine 1:100,000.   FINDINGS:  The patient had a pigmented lesion on the right cheek the central  aspect of which had been biopsied with the above findings.  An elliptical  excisional biopsy performed and the resulting closure obtained as noted.   DESCRIPTION OF PROCEDURE:  The patient was brought to the operating room and  marked off for the planned elliptical incision, essentially the entire  pigmented lesion where the central aspect had been recently biopsied.  She  was prepped with Betadine and draped sterilely, anesthetized with Xylocaine  2% with epinephrine 1:100,000.  The excisional biopsy was performed and full-  thickness skin biopsy removed and sent to pathology.  The wound was  approximated with interrupted subcutaneous 4-0 Monocryl followed by running  interrupted 6-0 Prolene.  Light compression dressing was applied.  The  patient tolerated the procedure well.   Return in one week for suture removal.                                               Consuello Bossier., M.D.    HH/MEDQ  D:  05/25/2004  T:  05/25/2004  Job:  [id number]

## 2011-02-17 NOTE — Op Note (Signed)
NAME:  Lauren Moreno, Lauren Moreno NO.:  [id number]   MEDICAL RECORD NO.:  [id number]                   PATIENT TYPE:  AMB   LOCATION:  ENDO                                 FACILITY:  [facility]   PHYSICIAN:  John C. Madilyn Fireman, M.D.                 DATE OF BIRTH:  1928/02/22   DATE OF PROCEDURE:  05/16/2004  DATE OF DISCHARGE:                                 OPERATIVE REPORT   PROCEDURE:  Colonoscopy.   INDICATION FOR PROCEDURE:  Average-risk colon cancer screening in a patient  who had a recent bout of symptoms compatible with diverticulitis, which  resolved on medical therapy.   PROCEDURE:  The patient was placed in the left lateral decubitus position  and placed on the pulse monitor with continuous low-flow oxygen delivered by  nasal cannula.  She was sedated with 50 mcg IV fentanyl and 6 mg IV Versed.  The Olympus video colonoscope was inserted into the rectum and advanced to  the cecum, confirmed by transillumination of McBurney's point and  visualization of the ileocecal valve and appendiceal orifice.  The prep was  good.  The cecum and ascending colon appeared normal.  No masses, polyps,  diverticula, or other mucosal abnormalities.  Within the transverse colon  there was a 6 mm sessile polyp that was fulgurated by hot biopsy.  Within  the descending and sigmoid colon there were seen multiple diverticula and no  other abnormalities.  The rectum appeared normal, and retroflexed view of  the anus revealed no obvious internal hemorrhoids.  The scope was then  withdrawn and the patient returned to the recovery room in stable condition.  She tolerated the procedure well, and there were no immediate complications.   IMPRESSION:  1. Small transverse colon polyp.  2. Sigmoid and descending diverticulosis.   PLAN:  Will await histology to determine method and interval for future  colon screening.                                               John C. Madilyn Fireman, M.D.    JCH/MEDQ  D:  05/16/2004  T:  05/16/2004  Job:  [id number]   cc:   Cassell Clement, M.D.  [address]  [place]  [place] [address]  Fax: [phone]

## 2011-03-02 ENCOUNTER — Emergency Department (HOSPITAL_COMMUNITY): Payer: Medicare Other

## 2011-03-02 ENCOUNTER — Emergency Department (HOSPITAL_COMMUNITY)
Admission: EM | Admit: 2011-03-02 | Discharge: 2011-03-02 | Disposition: A | Discharge status: Home / Self Care | Payer: Medicare Other | Attending: Emergency Medicine | Admitting: Emergency Medicine

## 2011-03-02 DIAGNOSIS — S8000XA Contusion of unspecified knee, initial encounter: Secondary | ICD-10-CM | POA: Insufficient documentation

## 2011-03-02 DIAGNOSIS — S8010XA Contusion of unspecified lower leg, initial encounter: Secondary | ICD-10-CM | POA: Insufficient documentation

## 2011-03-02 DIAGNOSIS — Z7982 Long term (current) use of aspirin: Secondary | ICD-10-CM | POA: Insufficient documentation

## 2011-03-02 DIAGNOSIS — W19XXXA Unspecified fall, initial encounter: Secondary | ICD-10-CM | POA: Insufficient documentation

## 2011-03-02 DIAGNOSIS — M25569 Pain in unspecified knee: Secondary | ICD-10-CM | POA: Insufficient documentation

## 2011-03-02 DIAGNOSIS — M79609 Pain in unspecified limb: Secondary | ICD-10-CM

## 2011-03-02 DIAGNOSIS — Z79899 Other long term (current) drug therapy: Secondary | ICD-10-CM | POA: Insufficient documentation

## 2011-03-02 DIAGNOSIS — M25469 Effusion, unspecified knee: Secondary | ICD-10-CM | POA: Insufficient documentation

## 2011-03-02 DIAGNOSIS — M7989 Other specified soft tissue disorders: Secondary | ICD-10-CM | POA: Insufficient documentation

## 2011-03-06 ENCOUNTER — Encounter: Payer: Self-pay | Admitting: Urology

## 2011-03-16 ENCOUNTER — Inpatient Hospital Stay (HOSPITAL_COMMUNITY)
Admission: EM | Admit: 2011-03-16 | Discharge: 2011-03-21 | DRG: 948 | Disposition: A | Discharge status: Skilled Nursing Facility | Payer: Medicare Other | Attending: Internal Medicine | Admitting: Internal Medicine

## 2011-03-16 ENCOUNTER — Emergency Department (HOSPITAL_COMMUNITY): Payer: Medicare Other

## 2011-03-16 DIAGNOSIS — F3289 Other specified depressive episodes: Secondary | ICD-10-CM | POA: Diagnosis present

## 2011-03-16 DIAGNOSIS — Z880 Allergy status to penicillin: Secondary | ICD-10-CM

## 2011-03-16 DIAGNOSIS — R269 Unspecified abnormalities of gait and mobility: Secondary | ICD-10-CM | POA: Diagnosis present

## 2011-03-16 DIAGNOSIS — Y92009 Unspecified place in unspecified non-institutional (private) residence as the place of occurrence of the external cause: Secondary | ICD-10-CM

## 2011-03-16 DIAGNOSIS — Z7982 Long term (current) use of aspirin: Secondary | ICD-10-CM

## 2011-03-16 DIAGNOSIS — T50995A Adverse effect of other drugs, medicaments and biological substances, initial encounter: Secondary | ICD-10-CM | POA: Diagnosis present

## 2011-03-16 DIAGNOSIS — I498 Other specified cardiac arrhythmias: Secondary | ICD-10-CM | POA: Diagnosis present

## 2011-03-16 DIAGNOSIS — R42 Dizziness and giddiness: Secondary | ICD-10-CM | POA: Diagnosis present

## 2011-03-16 DIAGNOSIS — E039 Hypothyroidism, unspecified: Secondary | ICD-10-CM | POA: Diagnosis present

## 2011-03-16 DIAGNOSIS — R0789 Other chest pain: Secondary | ICD-10-CM | POA: Diagnosis present

## 2011-03-16 DIAGNOSIS — R5383 Other fatigue: Principal | ICD-10-CM | POA: Diagnosis present

## 2011-03-16 DIAGNOSIS — F329 Major depressive disorder, single episode, unspecified: Secondary | ICD-10-CM | POA: Diagnosis present

## 2011-03-16 DIAGNOSIS — R5381 Other malaise: Principal | ICD-10-CM | POA: Diagnosis present

## 2011-03-16 DIAGNOSIS — Z79899 Other long term (current) drug therapy: Secondary | ICD-10-CM

## 2011-03-16 LAB — DIFFERENTIAL
Basophils Relative: 0 % (ref 0–1)
Eosinophils Absolute: 0.3 10*3/uL (ref 0.0–0.7)
Neutrophils Relative %: 60 % (ref 43–77)

## 2011-03-16 LAB — URINALYSIS, ROUTINE W REFLEX MICROSCOPIC
Ketones, ur: NEGATIVE mg/dL
Nitrite: NEGATIVE
Specific Gravity, Urine: 1.019 (ref 1.005–1.030)
Urobilinogen, UA: 0.2 mg/dL (ref 0.0–1.0)
pH: 6.5 (ref 5.0–8.0)

## 2011-03-16 LAB — CBC
Platelets: 203 10*3/uL (ref 150–400)
RBC: 4.8 MIL/uL (ref 3.87–5.11)
RDW: 13.7 % (ref 11.5–15.5)
WBC: 7.8 10*3/uL (ref 4.0–10.5)

## 2011-03-16 LAB — URINE MICROSCOPIC-ADD ON

## 2011-03-16 LAB — BASIC METABOLIC PANEL
CO2: 28 mEq/L (ref 19–32)
Calcium: 9.5 mg/dL (ref 8.4–10.5)
Chloride: 107 mEq/L (ref 96–112)
Glucose, Bld: 88 mg/dL (ref 70–99)
Potassium: 4.1 mEq/L (ref 3.5–5.1)
Sodium: 143 mEq/L (ref 135–145)

## 2011-03-17 LAB — CARDIAC PANEL(CRET KIN+CKTOT+MB+TROPI)
CK, MB: 1.7 ng/mL (ref 0.3–4.0)
CK, MB: 1.8 ng/mL (ref 0.3–4.0)
Relative Index: INVALID (ref 0.0–2.5)
Relative Index: INVALID (ref 0.0–2.5)
Total CK: 37 U/L (ref 7–177)
Total CK: 44 U/L (ref 7–177)
Troponin I: <0.3 ng/mL (ref <0.30)
Troponin I: <0.3 ng/mL (ref <0.30)

## 2011-03-17 LAB — TSH: TSH: 3.198 u[IU]/mL (ref 0.350–4.500)

## 2011-03-20 DIAGNOSIS — R269 Unspecified abnormalities of gait and mobility: Secondary | ICD-10-CM

## 2011-03-20 LAB — URINE CULTURE

## 2011-03-31 NOTE — H&P (Signed)
NAME:  Lauren Moreno, SARAVIA NO.:  [id number]  MEDICAL RECORD NO.:  [id number]  LOCATION:  [facility]                         FACILITY:  [facility]  PHYSICIAN:  Houston Siren, MD           DATE OF BIRTH:  January 24, 1928  DATE OF ADMISSION:  03/16/2011 DATE OF DISCHARGE:                             HISTORY & PHYSICAL   PRIMARY CARE PHYSICIAN:  None.  CARDIOLOGIST:  Cassell Clement, MD  REASON FOR ADMISSION:  Presyncope.  ADVANCE DIRECTIVE:  Full code.  HISTORY OF PRESENT ILLNESS:  This is an 77 year old on female with history of chest pain, palpitations but with prior negative Myoview, on chronic beta-blocker for reportedly angina, presents to the emergency room, because she felt lightheaded, weak and had a presyncopal episode upon standing today.  She has history of unsteadiness in the past and had several falls.  She actually was admitted for falling in the past without clear etiology.  She denies any chest pain, shortness of breath, headache, abdominal cramps or pain, black stool or bloody stool, visual problem or any neurological complaints otherwise.  Evaluation showed that she has bradycardia with EKGs showed heart rates in the 50.  Her blood pressure was 120/70.  She has negative head CT, urinalysis and negative lab works.  Her chest x-ray showed only bronchitic changes. Hospitalist was asked to admit the patient for presyncopal symptoms.  PAST MEDICAL HISTORY: 1. Mitral regurgitation. 2. Syncope. 3. Angina. 4. Hypertension.  SOCIAL HISTORY:  She denied tobacco, alcohol, or drug use.  CURRENT MEDICATIONS:  Inderal, aspirin.  ALLERGIES:  To PENICILLIN.  PHYSICAL EXAMINATION:  VITAL SIGNS:  Blood pressure 120/53, heart rate high 50-60, respiratory rate of 12, temperature 97.8. GENERAL:  She is alert and oriented and conversing.  She has no sinus tenderness. NECK:  Supple.  Throat is clear.  Speech is fluent.  Tongue is midline. Uvula elevated with phonation.  She  has no thyromegaly or any thyroid bruit. CARDIAC:  Revealed S1, S2 bradycardia with no murmur, rub, or gallop. LUNGS:  Clear. ABDOMEN:  Soft, nondistended, nontender. EXTREMITIES:  No edema. NEUROLOGIC/PSYCHIATRIC:  Unremarkable as well.  OBJECTIVE FINDINGS:  Head CT show atrophy and chronic ischemic changes without any acute intracranial abnormality.  Chest x-ray shows bronchitic changes, but otherwise negative.  Serum sodium 143, potassium 4.1, creatinine 0.57, glucose of 88.  Urinalysis is negative.  White count of 7.8,000, hemoglobin of 14.9.  EKG shows sinus bradycardia without any acute ST-T changes.  IMPRESSION:  This is an 77 year old female with history of chest pain, palpitation, although no definite diagnosis of coronary artery disease, presents with presyncopal symptoms and lightheadedness.  She was found to be relatively bradycardic and on beta-blocker.  Her beta-blocker has recently been increased, and I suspect that this might be contributing to her symptoms.  She does have history of hypothyroidism and has missed her Synthroid, but only for 1 week.  Because of the long half life of thyroid medication, I don't think that is the problem.  We will admit her to telemetry.  I will discontinue her beta-blocker and get an echo of her heart.  We will check her TSH and resume her medication.  She is a full code and will be admitted to [facility].  She is stable.     Houston Siren, MD     PL/MEDQ  D:  03/17/2011  T:  03/17/2011  Job:  [id number]  Electronically Signed by Houston Siren  on 03/31/2011 09:17:37 PM

## 2011-04-01 NOTE — Discharge Summary (Signed)
NAME:  Lauren Moreno, Lauren Moreno NO.:  [id number]  MEDICAL RECORD NO.:  [id number]  LOCATION:  4707                         FACILITY:  [facility]  PHYSICIAN:  Lonia Blood, M.D.       DATE OF BIRTH:  [birth year]  DATE OF ADMISSION:  03/16/2011 DATE OF DISCHARGE:  03/21/2011                              DISCHARGE SUMMARY   PRIMARY CARE PHYSICIAN:  Cassell Clement, MD  DISCHARGE DIAGNOSES: 1. Chronic weakness, dizziness, gait instability of unclear etiology -     felt to be related to propranolol - if the patient continues to     have these symptoms after discontinuation of propranolol, she needs     an outpatient neurological evaluation. 2. Chronic intermittent chest pain of non-cardiac etiology - last     normal stress test was on December 18, 2010 in the office of Dr.     Cassell Clement. 3. Depression - refusing medications. 4. Status post partial hysterectomy. 5. Status post appendectomy. 6. Hypothyroidism.  DISCHARGE MEDICATIONS: 1. Tylenol 650 mg by mouth every 4 hours as needed for pain. 2. Aspirin 325 mg daily. 3. Multivitamin tablet daily. 4. Nitroglycerin 0.4 mg every 5 minutes for chest pain. 5. Synthroid 25 mcg daily.  CONDITION ON DISCHARGE:  Lauren Moreno will be transferred to short-term skilled nursing rehabilitation after discharge from the nursing home. She will follow up with her new primary care physician Dr. Burnell Blanks.  HISTORY AND PHYSICAL:  Refer to dictated H&P done by Dr. Conley Rolls.  PROCEDURES:  The patient underwent a head CT without contrast which shows atrophy, chronic ischemic white matter changes, no acute intracranial abnormality.  PROCEDURE:  Telemetry monitoring for 72 hours without any arrhythmias.  CONSULTATION:  No consultations obtained.  HOSPITAL COURSE:  Lauren Moreno is a 78 year old woman with history of chronic dizziness, weakness was admitted on March 16, 2011 after family brought her to the emergency room with complaints of  ongoing feeling like she is going to pass out.  The patient was placed on telemetry and she did not have any arrhythmias.  She had 3 sets of cardiac enzymes which were all within normal limits.  We noticed that she was markedly bradycardic on admission and she seemed to be with a depressed, flat affect.  We discontinued the propranolol, watched the patient closely without any signs of withdrawal from abrupt discontinuation of propranolol.  On the contrary, we saw the patient becoming more alert, more interactive, and her heart rate stayed constant into the 60s and 70s rather than 40s and 50s.  Conservation was given for possible urinary tract infection but urine culture grew only 6000 of multiple colonies not suggestive of significant urinary tract infection.  The patient was also neurologically evaluated multiple times and there was no concern to say that she may have a stroke.  Our current plan is to pursue aggressive physical therapy, occupational therapy, rehabilitation at skilled nursing home for couple weeks.  If she continues to display significant weakness in the later part of the day, she cannot get well. Outpatient neurological consultation should be pursued.  I also think that Ms. Hammond is depressed but she adamantly refuses to accept the diagnosis.  I had discussion with the family about the [facility] supportive cognitive behavior therapy that they can provide themselves.     Lonia Blood, M.D.     SL/MEDQ  D:  03/21/2011  T:  03/21/2011  Job:  [id number]  cc:   Cassell Clement, M.D.  Electronically Signed by Lonia Blood M.D. on 04/01/2011 04:45:09 PM

## 2011-04-10 ENCOUNTER — Telehealth: Payer: Self-pay | Admitting: Cardiology

## 2011-04-10 NOTE — Telephone Encounter (Signed)
I would not recommend continuing Zyprexa

## 2011-04-10 NOTE — Telephone Encounter (Signed)
Recvd call from patient.  She was prescribed by Dr. Kevan Ny; Saddie Benders (generic of dyprexia).  Patient read label and questions using this medication.  Please call.

## 2011-04-10 NOTE — Telephone Encounter (Signed)
Advised patient to not take and see how she does at home sleeping ok

## 2011-04-10 NOTE — Telephone Encounter (Signed)
Fell in hospital for a week and went to rehab.  Dr gates zyprexia.  Should she take this, couldn't tolerate remeron.  Was having trouble sleeping.  Home now, d/c Sjaturday. Took twice while she was there.  The two nights she was home, no problems sleeping.  Please advise.

## 2011-05-04 ENCOUNTER — Ambulatory Visit: Payer: Medicare Other | Admitting: Cardiology

## 2011-09-29 ENCOUNTER — Other Ambulatory Visit: Payer: Self-pay | Admitting: Orthopedic Surgery

## 2011-09-29 ENCOUNTER — Ambulatory Visit
Admission: RE | Admit: 2011-09-29 | Discharge: 2011-09-29 | Disposition: A | Discharge status: Home / Self Care | Payer: Medicare Other | Source: Ambulatory Visit | Attending: Orthopedic Surgery | Admitting: Orthopedic Surgery

## 2011-09-29 DIAGNOSIS — R0602 Shortness of breath: Secondary | ICD-10-CM

## 2011-09-29 DIAGNOSIS — M25469 Effusion, unspecified knee: Secondary | ICD-10-CM

## 2011-09-29 MED ORDER — IOHEXOL 300 MG/ML  SOLN
125.0000 mL | Freq: Once | INTRAMUSCULAR | Status: AC | PRN
  Administered 2011-09-29: 125 mL via INTRAVENOUS

## 2011-10-05 DIAGNOSIS — R0609 Other forms of dyspnea: Secondary | ICD-10-CM | POA: Diagnosis not present

## 2011-10-05 DIAGNOSIS — R609 Edema, unspecified: Secondary | ICD-10-CM | POA: Diagnosis not present

## 2011-10-05 DIAGNOSIS — M79609 Pain in unspecified limb: Secondary | ICD-10-CM | POA: Diagnosis not present

## 2011-10-05 DIAGNOSIS — R0989 Other specified symptoms and signs involving the circulatory and respiratory systems: Secondary | ICD-10-CM | POA: Diagnosis not present

## 2011-10-19 ENCOUNTER — Institutional Professional Consult (permissible substitution): Payer: Medicare Other | Admitting: Pulmonary Disease

## 2011-10-24 ENCOUNTER — Institutional Professional Consult (permissible substitution): Payer: Medicare Other | Admitting: Pulmonary Disease

## 2012-01-08 ENCOUNTER — Encounter: Payer: Self-pay | Admitting: *Deleted

## 2012-05-07 DIAGNOSIS — H2510 Age-related nuclear cataract, unspecified eye: Secondary | ICD-10-CM | POA: Diagnosis not present

## 2012-05-08 DIAGNOSIS — L0100 Impetigo, unspecified: Secondary | ICD-10-CM | POA: Diagnosis not present

## 2012-05-15 DIAGNOSIS — Z6831 Body mass index (BMI) 31.0-31.9, adult: Secondary | ICD-10-CM | POA: Diagnosis not present

## 2012-05-15 DIAGNOSIS — E038 Other specified hypothyroidism: Secondary | ICD-10-CM | POA: Diagnosis not present

## 2012-05-15 DIAGNOSIS — Z8673 Personal history of transient ischemic attack (TIA), and cerebral infarction without residual deficits: Secondary | ICD-10-CM | POA: Diagnosis not present

## 2012-05-15 DIAGNOSIS — H269 Unspecified cataract: Secondary | ICD-10-CM | POA: Diagnosis not present

## 2012-05-23 DIAGNOSIS — H2510 Age-related nuclear cataract, unspecified eye: Secondary | ICD-10-CM | POA: Diagnosis not present

## 2012-05-23 DIAGNOSIS — H2589 Other age-related cataract: Secondary | ICD-10-CM | POA: Diagnosis not present

## 2012-06-05 ENCOUNTER — Encounter (HOSPITAL_COMMUNITY): Payer: Self-pay | Admitting: Emergency Medicine

## 2012-06-05 ENCOUNTER — Emergency Department (HOSPITAL_COMMUNITY)
Admission: EM | Admit: 2012-06-05 | Discharge: 2012-06-05 | Disposition: A | Discharge status: Home / Self Care | Payer: Medicare Other | Attending: Emergency Medicine | Admitting: Emergency Medicine

## 2012-06-05 ENCOUNTER — Emergency Department (HOSPITAL_COMMUNITY): Payer: Medicare Other

## 2012-06-05 DIAGNOSIS — Z79899 Other long term (current) drug therapy: Secondary | ICD-10-CM | POA: Diagnosis not present

## 2012-06-05 DIAGNOSIS — S0003XA Contusion of scalp, initial encounter: Secondary | ICD-10-CM | POA: Insufficient documentation

## 2012-06-05 DIAGNOSIS — S60229A Contusion of unspecified hand, initial encounter: Secondary | ICD-10-CM | POA: Insufficient documentation

## 2012-06-05 DIAGNOSIS — R42 Dizziness and giddiness: Secondary | ICD-10-CM | POA: Diagnosis not present

## 2012-06-05 DIAGNOSIS — R51 Headache: Secondary | ICD-10-CM | POA: Diagnosis not present

## 2012-06-05 DIAGNOSIS — Z9181 History of falling: Secondary | ICD-10-CM | POA: Diagnosis not present

## 2012-06-05 DIAGNOSIS — W19XXXA Unspecified fall, initial encounter: Secondary | ICD-10-CM

## 2012-06-05 DIAGNOSIS — E079 Disorder of thyroid, unspecified: Secondary | ICD-10-CM | POA: Diagnosis not present

## 2012-06-05 DIAGNOSIS — E785 Hyperlipidemia, unspecified: Secondary | ICD-10-CM | POA: Diagnosis not present

## 2012-06-05 DIAGNOSIS — R296 Repeated falls: Secondary | ICD-10-CM | POA: Insufficient documentation

## 2012-06-05 DIAGNOSIS — I10 Essential (primary) hypertension: Secondary | ICD-10-CM | POA: Insufficient documentation

## 2012-06-05 DIAGNOSIS — M542 Cervicalgia: Secondary | ICD-10-CM | POA: Diagnosis not present

## 2012-06-05 DIAGNOSIS — M79609 Pain in unspecified limb: Secondary | ICD-10-CM | POA: Diagnosis not present

## 2012-06-05 DIAGNOSIS — R404 Transient alteration of awareness: Secondary | ICD-10-CM | POA: Diagnosis not present

## 2012-06-05 DIAGNOSIS — T1490XA Injury, unspecified, initial encounter: Secondary | ICD-10-CM | POA: Diagnosis not present

## 2012-06-05 LAB — POCT I-STAT, CHEM 8
Calcium, Ion: 1.24 mmol/L (ref 1.13–1.30)
Glucose, Bld: 166 mg/dL — ABNORMAL HIGH (ref 70–99)
HCT: 45 % (ref 36.0–46.0)
Hemoglobin: 15.3 g/dL — ABNORMAL HIGH (ref 12.0–15.0)
Potassium: 3.9 mEq/L (ref 3.5–5.1)
TCO2: 25 mmol/L (ref 0–100)

## 2012-06-05 LAB — URINALYSIS, ROUTINE W REFLEX MICROSCOPIC
Bilirubin Urine: NEGATIVE
Ketones, ur: NEGATIVE mg/dL
Nitrite: NEGATIVE
Protein, ur: NEGATIVE mg/dL
Urobilinogen, UA: 0.2 mg/dL (ref 0.0–1.0)

## 2012-06-05 MED ORDER — ACETAMINOPHEN 325 MG PO TABS
650.0000 mg | ORAL_TABLET | Freq: Once | ORAL | Status: AC
  Administered 2012-06-05: 650 mg via ORAL
  Filled 2012-06-05: qty 2

## 2012-06-05 MED ORDER — MECLIZINE HCL 25 MG PO TABS
25.0000 mg | ORAL_TABLET | Freq: Once | ORAL | Status: AC
  Administered 2012-06-05: 25 mg via ORAL
  Filled 2012-06-05: qty 1

## 2012-06-05 MED ORDER — MECLIZINE HCL 25 MG PO TABS: 25.0000 mg | ORAL_TABLET | Freq: Three times a day (TID) | ORAL | Status: AC | PRN

## 2012-06-05 NOTE — ED Provider Notes (Signed)
Patient with a hx sig for HTN, vertigo and cataracts with 1 repaired and the unrepaired, was placed in CDU not on protocol by Dr. Denton Lank. Patient care resumed from [facility], [place] .  Patient is here for a fall and dizziness and has received a head CT which is negative.  Patient re-evaluated and is resting comfortable, VSS, with no new complaints or concerns at this time. Plan per previous provider is to re-evaluate after CT. On exam: hemodynamically stable, NAD, heart w/ RRR, lungs CTAB, Chest & abd non-tender, no peripheral edema or calf tenderness.  Head CT negative for no intracranial abnormality, but pt continues to c/o of dizziness.  She describes it as if the room is shaking, but not spinning.  She also continues to c/o neck pain.  Nursing staff are noting that she is unbalanced and unable to walk without assistance at this time.   I will check a chem 8, let her eat and try a dose of antivert to see if the dizziness resolves.    4:08 PM  Chem 8 and UA without abnormality.  CT without evidence of acute infarction, hemorrhage, mass lesion, mass effect, midline shift or abnormal extra-axial fluid collection.  CT cervical spine without fracture or subluxation.  Pt states she is still some dizzy, but is able to ambulate in the hall without difficulty.  She would like to go home.   I have discussed the importance of close follow-up with her PCP. I have also discussed reasons to return immediately to the ER.  Patient expresses understanding and agrees with plan.  1. Medications: antivert 2. Treatment: take antivert as needed, take Zyrtec each day 3. Follow Up: with PCP    Dierdre Forth, PA-C 06/05/12 2213

## 2012-06-05 NOTE — ED Provider Notes (Signed)
Patient stable. However she is unable to ambulate on her own. Further workup needed. Patient care signed out to [facility], PA-C  [place], [place] 06/05/12 1556

## 2012-06-05 NOTE — ED Notes (Signed)
Patient states she doesn't feel right. States she is starting to hurt all over. Complaining of pain in left arm, neck and back. Rates pain as 5/10 in neck.

## 2012-06-05 NOTE — ED Notes (Signed)
EDP at bedside updating patient. 

## 2012-06-05 NOTE — ED Notes (Signed)
Patient is resting comfortably. 

## 2012-06-05 NOTE — ED Provider Notes (Signed)
History     CSN: [id number]  Arrival date & time 06/05/12  1112   First MD Initiated Contact with Patient 06/05/12 1129      Chief Complaint  Patient presents with  . Fall  . Dizziness    (Consider location/radiation/quality/duration/timing/severity/associated sxs/prior treatment) Patient is a 78 y.o. female presenting with fall. The history is provided by the patient.  Fall Pertinent negatives include no fever, no numbness, no abdominal pain and no vomiting.  pt states fell at eye doctors office this morning. States was there to have cataract surgery. States awoke this am, felt well/at baseline. At doctors went up to check in window, turned quickly to return to seat and transiently lost balance causing her to fall. Notes hx same. Denies feeling lightheaded or faint. No syncope. No loc. Hit head and neck. C/o right head/neck pain post fall. No preceding headache. No numbness/weakness. No radicular pain. Also c/o contusion/pain to right hand. Denies other pain or injury. States otherwise feels at baseline.     Past Medical History  Diagnosis Date  . Hyperlipidemia   . Hypertension   . Thyroid disease     Past Surgical History  Procedure Date  . Appendectomy   . Partial hysterectomy   . Hemorroidectomy   . Tonsillectomy and adenoidectomy     Family History  Problem Relation Age of Onset  . Cancer      History  Substance Use Topics  . Smoking status: Never Smoker   . Smokeless tobacco: Not on file  . Alcohol Use: No    OB History    Grav Para Term Preterm Abortions TAB SAB Ect Mult Living                  Review of Systems  Constitutional: Negative for fever and chills.  HENT: Negative for nosebleeds.   Eyes: Negative for visual disturbance.  Respiratory: Negative for shortness of breath.   Cardiovascular: Negative for chest pain.  Gastrointestinal: Negative for vomiting and abdominal pain.  Genitourinary: Negative for flank pain.  Musculoskeletal: Negative  for back pain.  Skin: Negative for rash.  Neurological: Negative for syncope, weakness and numbness.  Hematological: Does not bruise/bleed easily.  Psychiatric/Behavioral: Negative for confusion.    Allergies  Penicillins  Home Medications   Current Outpatient Rx  Name Route Sig Dispense Refill  . ASPIRIN 325 MG PO TABS Oral Take 325 mg by mouth daily.    Marland Kitchen LEVOTHYROXINE SODIUM 25 MCG PO TABS Oral Take 25 mcg by mouth daily.    Marland Kitchen MECLIZINE HCL 25 MG PO TABS Oral Take 25 mg by mouth as needed.    Marland Kitchen ONE-DAILY MULTI VITAMINS PO TABS Oral Take 1 tablet by mouth daily.    Marland Kitchen NITROGLYCERIN 0.4 MG SL SUBL Sublingual Place 0.4 mg under the tongue every 5 (five) minutes as needed.    Marland Kitchen PROPRANOLOL HCL 40 MG PO TABS Oral Take 40 mg by mouth daily.      BP 140/58  Pulse 70  Temp 98 F (36.7 C) (Oral)  Resp 20  SpO2 97%  Physical Exam  Nursing note and vitals reviewed. Constitutional: She is oriented to person, place, and time. She appears well-developed and well-nourished. No distress.  HENT:       Contusion/tenderness right scalp.   Eyes: Conjunctivae are normal. Pupils are equal, round, and reactive to light. No scleral icterus.  Neck: Neck supple. No tracheal deviation present.       Tenderness mid/right neck. Spine  aligned, no step off. No bruit  Cardiovascular: Normal rate, regular rhythm, normal heart sounds and intact distal pulses.   Pulmonary/Chest: Effort normal and breath sounds normal. No respiratory distress.  Abdominal: Soft. Normal appearance and bowel sounds are normal. She exhibits no distension. There is no tenderness.       No puls mass  Genitourinary:       No cva tenderness  Musculoskeletal: She exhibits no edema and no tenderness.       Cervical tenderness otherwise CTLS spine, non tender, aligned, no step off.   Neurological: She is alert and oriented to person, place, and time.       Motor intact bil. Steady gait.   Skin: Skin is warm and dry. No rash  noted.  Psychiatric: She has a normal mood and affect.    ED Course  Procedures (including critical care time)     MDM  Xr, ct.   Pt moved to cdu awaiting xrays and ct.  Discussed plan with cdu pa, oliveri.          Suzi Roots, MD 06/05/12 [phone]

## 2012-06-05 NOTE — ED Notes (Signed)
Per EMS: pt from [facility] where sts she turned around quickly and hit her head and hand on glass partition while falling and now c/o dizziness; no LOC; bruising to right thumb noted; no anticoagulants

## 2012-06-07 DIAGNOSIS — J309 Allergic rhinitis, unspecified: Secondary | ICD-10-CM | POA: Diagnosis not present

## 2012-06-07 DIAGNOSIS — R42 Dizziness and giddiness: Secondary | ICD-10-CM | POA: Diagnosis not present

## 2012-06-09 NOTE — ED Provider Notes (Signed)
Medical screening examination/treatment/procedure(s) were performed by non-physician practitioner and as supervising physician I was immediately available for consultation/collaboration.  Raeford Razor, MD 06/09/12 [phone]

## 2012-06-10 NOTE — ED Provider Notes (Signed)
Medical screening examination/treatment/procedure(s) were conducted as a shared visit with non-physician practitioner(s) and myself.  I personally evaluated the patient during the encounter   Suzi Roots, MD 06/10/12 [phone]

## 2012-06-11 DIAGNOSIS — H2510 Age-related nuclear cataract, unspecified eye: Secondary | ICD-10-CM | POA: Diagnosis not present

## 2012-06-13 DIAGNOSIS — H2589 Other age-related cataract: Secondary | ICD-10-CM | POA: Diagnosis not present

## 2012-06-13 DIAGNOSIS — H2510 Age-related nuclear cataract, unspecified eye: Secondary | ICD-10-CM | POA: Diagnosis not present

## 2012-11-19 ENCOUNTER — Other Ambulatory Visit: Payer: Self-pay | Admitting: Family Medicine

## 2012-11-19 DIAGNOSIS — Z6831 Body mass index (BMI) 31.0-31.9, adult: Secondary | ICD-10-CM | POA: Diagnosis not present

## 2012-11-19 DIAGNOSIS — R002 Palpitations: Secondary | ICD-10-CM | POA: Diagnosis not present

## 2012-11-19 DIAGNOSIS — E038 Other specified hypothyroidism: Secondary | ICD-10-CM | POA: Diagnosis not present

## 2012-11-19 DIAGNOSIS — Z1231 Encounter for screening mammogram for malignant neoplasm of breast: Secondary | ICD-10-CM

## 2012-11-21 DIAGNOSIS — R002 Palpitations: Secondary | ICD-10-CM | POA: Diagnosis not present

## 2012-12-11 DIAGNOSIS — R002 Palpitations: Secondary | ICD-10-CM | POA: Diagnosis not present

## 2012-12-18 ENCOUNTER — Emergency Department (HOSPITAL_COMMUNITY)
Admission: EM | Admit: 2012-12-18 | Discharge: 2012-12-18 | Disposition: A | Discharge status: Home / Self Care | Payer: Medicare Other | Attending: Emergency Medicine | Admitting: Emergency Medicine

## 2012-12-18 ENCOUNTER — Emergency Department (HOSPITAL_COMMUNITY): Payer: Medicare Other

## 2012-12-18 ENCOUNTER — Encounter (HOSPITAL_COMMUNITY): Payer: Self-pay | Admitting: *Deleted

## 2012-12-18 DIAGNOSIS — Z79899 Other long term (current) drug therapy: Secondary | ICD-10-CM | POA: Insufficient documentation

## 2012-12-18 DIAGNOSIS — Z8639 Personal history of other endocrine, nutritional and metabolic disease: Secondary | ICD-10-CM | POA: Insufficient documentation

## 2012-12-18 DIAGNOSIS — E079 Disorder of thyroid, unspecified: Secondary | ICD-10-CM | POA: Insufficient documentation

## 2012-12-18 DIAGNOSIS — Z7982 Long term (current) use of aspirin: Secondary | ICD-10-CM | POA: Insufficient documentation

## 2012-12-18 DIAGNOSIS — Z862 Personal history of diseases of the blood and blood-forming organs and certain disorders involving the immune mechanism: Secondary | ICD-10-CM | POA: Insufficient documentation

## 2012-12-18 DIAGNOSIS — R079 Chest pain, unspecified: Secondary | ICD-10-CM | POA: Insufficient documentation

## 2012-12-18 DIAGNOSIS — R002 Palpitations: Secondary | ICD-10-CM | POA: Insufficient documentation

## 2012-12-18 DIAGNOSIS — I10 Essential (primary) hypertension: Secondary | ICD-10-CM | POA: Insufficient documentation

## 2012-12-18 DIAGNOSIS — R5381 Other malaise: Secondary | ICD-10-CM | POA: Insufficient documentation

## 2012-12-18 DIAGNOSIS — R0602 Shortness of breath: Secondary | ICD-10-CM | POA: Diagnosis not present

## 2012-12-18 LAB — COMPREHENSIVE METABOLIC PANEL
Alkaline Phosphatase: 87 U/L (ref 39–117)
BUN: 17 mg/dL (ref 6–23)
CO2: 24 mEq/L (ref 19–32)
Calcium: 10.2 mg/dL (ref 8.4–10.5)
GFR calc Af Amer: >90 mL/min (ref >90)
GFR calc non Af Amer: 86 mL/min — ABNORMAL LOW (ref >90)
Glucose, Bld: 91 mg/dL (ref 70–99)
Total Protein: 7.6 g/dL (ref 6.0–8.3)

## 2012-12-18 LAB — CBC
HCT: 44.5 % (ref 36.0–46.0)
Hemoglobin: 15.3 g/dL — ABNORMAL HIGH (ref 12.0–15.0)
MCH: 31.3 pg (ref 26.0–34.0)
MCHC: 34.4 g/dL (ref 30.0–36.0)
RBC: 4.89 MIL/uL (ref 3.87–5.11)

## 2012-12-18 LAB — GLUCOSE, CAPILLARY: Glucose-Capillary: 80 mg/dL (ref 70–99)

## 2012-12-18 NOTE — ED Provider Notes (Signed)
History     CSN: [id number]  Arrival date & time 12/18/12  1148   First MD Initiated Contact with Patient 12/18/12 1402      Chief Complaint  Patient presents with  . Shortness of Breath    sent by MD for abnormal halter monitor reading    (Consider location/radiation/quality/duration/timing/severity/associated sxs/prior treatment) The history is provided by the patient.   79 year old female brought in by her son. Patient's family physician and requested that she be brought in for cardiac eval. Patient is currently undergoing Holter monitoring period but not clear whether there was any abnormalities on that. Patient didn't develop left arm pain during the night also had palpitations not rapid heartbeat which is palpitations. And today was feeling very weak and tired. Patient's had similar symptoms in the past primary care doctor probably not aware of that. No distinct chest pain. Chest the left arm pain. Associated with some mild shortness of breath.  Past Medical History  Diagnosis Date  . Hyperlipidemia   . Hypertension   . Thyroid disease     Past Surgical History  Procedure Laterality Date  . Appendectomy    . Partial hysterectomy    . Hemorroidectomy    . Tonsillectomy and adenoidectomy      Family History  Problem Relation Age of Onset  . Cancer      History  Substance Use Topics  . Smoking status: Never Smoker   . Smokeless tobacco: Not on file  . Alcohol Use: No    OB History   Grav Para Term Preterm Abortions TAB SAB Ect Mult Living                  Review of Systems  Constitutional: Positive for fatigue. Negative for fever.  HENT: Negative for congestion.   Respiratory: Negative for shortness of breath.   Cardiovascular: Positive for chest pain and palpitations.  Gastrointestinal: Negative for nausea, vomiting and abdominal pain.  Musculoskeletal: Negative for back pain.  Skin: Negative for rash.  Neurological: Positive for weakness. Negative for  headaches.  Hematological: Does not bruise/bleed easily.  Psychiatric/Behavioral: Negative for confusion.    Allergies  Penicillins and Shrimp  Home Medications   Current Outpatient Rx  Name  Route  Sig  Dispense  Refill  . acetaminophen (TYLENOL) 325 MG tablet   Oral   Take 325-650 mg by mouth every 6 (six) hours as needed. For pain         . aspirin EC 325 MG tablet   Oral   Take 325 mg by mouth daily.         Marland Kitchen Besifloxacin HCl 0.6 % SUSP   Left Eye   Place 1 drop into the left eye 2 (two) times daily.         Marland Kitchen levothyroxine (SYNTHROID, LEVOTHROID) 25 MCG tablet   Oral   Take 25 mcg by mouth daily.         Marland Kitchen loteprednol (LOTEMAX) 0.5 % ophthalmic suspension   Left Eye   Place 1 drop into the left eye 4 (four) times daily.         . meclizine (ANTIVERT) 25 MG tablet   Oral   Take 25 mg by mouth as needed. For dizziness         . Menthol (RICOLA HONEY HERB MT)   Mouth/Throat   Use as directed 1 lozenge in the mouth or throat as needed. Sore or dry throat         .  Multiple Vitamin (MULTIVITAMIN WITH MINERALS) TABS   Oral   Take 1 tablet by mouth daily.         . nepafenac (NEVANAC) 0.1 % ophthalmic suspension   Left Eye   Place 3 drops into the left eye 3 (three) times daily.         . nitroGLYCERIN (NITROSTAT) 0.4 MG SL tablet   Sublingual   Place 0.4 mg under the tongue every 5 (five) minutes as needed. Chest pain         . sodium chloride (MURO 128) 5 % ophthalmic solution   Both Eyes   Place 1 drop into both eyes as needed. For dry eye         . VITAMIN D, CHOLECALCIFEROL, PO   Oral   Take 1 tablet by mouth daily.           BP 122/96  Pulse 79  Temp(Src) 97.7 F (36.5 C) (Oral)  SpO2 98%  Physical Exam  Nursing note and vitals reviewed. Constitutional: She is oriented to person, place, and time. She appears well-developed and well-nourished. No distress.  HENT:  Head: Normocephalic and atraumatic.  Mouth/Throat:  Oropharynx is clear and moist.  Eyes: Conjunctivae and EOM are normal. Pupils are equal, round, and reactive to light.  Neck: Normal range of motion. Neck supple.  Cardiovascular: Normal rate, regular rhythm, normal heart sounds and intact distal pulses.   No murmur heard. Pulmonary/Chest: Effort normal and breath sounds normal. No respiratory distress.  Abdominal: Soft. Bowel sounds are normal. There is no tenderness.  Musculoskeletal: Normal range of motion. She exhibits no edema.  Neurological: She is alert and oriented to person, place, and time. No cranial nerve deficit. She exhibits normal muscle tone. Coordination normal.  Skin: Skin is warm. No rash noted.    ED Course  Procedures (including critical care time)  Labs Reviewed  CBC - Abnormal; Notable for the following:    Hemoglobin 15.3 (*)    All other components within normal limits  GLUCOSE, CAPILLARY  COMPREHENSIVE METABOLIC PANEL  POCT I-STAT TROPONIN I   Dg Chest 2 View  12/18/2012  *RADIOLOGY REPORT*  Clinical Data: Shortness of breath  CHEST - 2 VIEW  Comparison: 03/16/2011  Findings: Cardiomediastinal silhouette is stable.  No acute infiltrate or pleural effusion.  No pulmonary edema.  Extensive degenerative changes left shoulder again noted.  IMPRESSION: No acute fracture or subluxation.  Degenerative changes left shoulder.   Original Report Authenticated By: Natasha Mead, M.D.    Results for orders placed during the hospital encounter of 12/18/12  CBC      Result Value Range   WBC 7.2  4.0 - 10.5 K/uL   RBC 4.89  3.87 - 5.11 MIL/uL   Hemoglobin 15.3 (*) 12.0 - 15.0 g/dL   HCT 16.1  09.6 - 04.5 %   MCV 91.0  78.0 - 100.0 fL   MCH 31.3  26.0 - 34.0 pg   MCHC 34.4  30.0 - 36.0 g/dL   RDW 40.9  81.1 - 91.4 %   Platelets 221  150 - 400 K/uL  GLUCOSE, CAPILLARY      Result Value Range   Glucose-Capillary 80  70 - 99 mg/dL  POCT I-STAT TROPONIN I      Result Value Range   Troponin i, poc 0.00  0.00 - 0.08 ng/mL    Comment 3             Date: 12/18/2012  Rate: 75  Rhythm: normal sinus rhythm  QRS Axis: left  Intervals: normal  ST/T Wave abnormalities: normal  Conduction Disutrbances:none  Narrative Interpretation:   Old EKG Reviewed: unchanged  Results for orders placed during the hospital encounter of 12/18/12  CBC      Result Value Range   WBC 7.2  4.0 - 10.5 K/uL   RBC 4.89  3.87 - 5.11 MIL/uL   Hemoglobin 15.3 (*) 12.0 - 15.0 g/dL   HCT 14.7  82.9 - 56.2 %   MCV 91.0  78.0 - 100.0 fL   MCH 31.3  26.0 - 34.0 pg   MCHC 34.4  30.0 - 36.0 g/dL   RDW 13.0  86.5 - 78.4 %   Platelets 221  150 - 400 K/uL  COMPREHENSIVE METABOLIC PANEL      Result Value Range   Sodium 134 (*) 135 - 145 mEq/L   Potassium 4.6  3.5 - 5.1 mEq/L   Chloride 99  96 - 112 mEq/L   CO2 24  19 - 32 mEq/L   Glucose, Bld 91  70 - 99 mg/dL   BUN 17  6 - 23 mg/dL   Creatinine, Ser 6.96  0.50 - 1.10 mg/dL   Calcium 29.5  8.4 - 28.4 mg/dL   Total Protein 7.6  6.0 - 8.3 g/dL   Albumin 3.8  3.5 - 5.2 g/dL   AST 25  0 - 37 U/L   ALT 9  0 - 35 U/L   Alkaline Phosphatase 87  39 - 117 U/L   Total Bilirubin 0.3  0.3 - 1.2 mg/dL   GFR calc non Af Amer 86 (*) >90 mL/min   GFR calc Af Amer >90  >90 mL/min  GLUCOSE, CAPILLARY      Result Value Range   Glucose-Capillary 80  70 - 99 mg/dL  POCT I-STAT TROPONIN I      Result Value Range   Troponin i, poc 0.00  0.00 - 0.08 ng/mL   Comment 3              No diagnosis found.    MDM   Workup in the emergency department without any significant findings. No evidence of acute cardiac event. EKG is normal troponin was negative. His troponin within greater than 6 hours from the sensation of your arm pain. Also discussed with her primary care doctor or your cardiac monitoring to date has not shown any significant abnormalities. Okay to be discharged home followup with her primary care Dr. Janae Bridgeman want to consider reconsultation with her cardiologist Dr. [name]. Chest x-rays negative  for pneumonia pneumothorax or pulmonary edema. No significant lab abnormalities.        Shelda Jakes, MD 12/18/12 [phone]

## 2012-12-18 NOTE — ED Notes (Signed)
PT states she has not been feeling well for some time, but states she has been feeling "terrible" since she awoke.  Pt has been wearing a halter monitor for 6 days d/t sob and L arm pain.  Today, she sent her results in and was told by Dr Westly Pam at [facility] [phone]) to come to ED for an EKG.  Pt denies chest pain, but c/o pain to L side of her head that started while in triage.

## 2012-12-19 DIAGNOSIS — G4700 Insomnia, unspecified: Secondary | ICD-10-CM | POA: Diagnosis not present

## 2012-12-19 DIAGNOSIS — Z6831 Body mass index (BMI) 31.0-31.9, adult: Secondary | ICD-10-CM | POA: Diagnosis not present

## 2012-12-19 DIAGNOSIS — J309 Allergic rhinitis, unspecified: Secondary | ICD-10-CM | POA: Diagnosis not present

## 2012-12-19 DIAGNOSIS — R079 Chest pain, unspecified: Secondary | ICD-10-CM | POA: Diagnosis not present

## 2012-12-27 ENCOUNTER — Ambulatory Visit: Payer: Medicare Other

## 2013-01-14 DIAGNOSIS — R002 Palpitations: Secondary | ICD-10-CM | POA: Diagnosis not present

## 2013-01-14 DIAGNOSIS — K219 Gastro-esophageal reflux disease without esophagitis: Secondary | ICD-10-CM | POA: Diagnosis not present

## 2013-01-14 DIAGNOSIS — Z1331 Encounter for screening for depression: Secondary | ICD-10-CM | POA: Diagnosis not present

## 2013-01-14 DIAGNOSIS — Z9181 History of falling: Secondary | ICD-10-CM | POA: Diagnosis not present

## 2013-02-17 ENCOUNTER — Other Ambulatory Visit: Payer: Self-pay | Admitting: Family Medicine

## 2013-02-17 DIAGNOSIS — Z780 Asymptomatic menopausal state: Secondary | ICD-10-CM

## 2013-02-17 DIAGNOSIS — R002 Palpitations: Secondary | ICD-10-CM | POA: Diagnosis not present

## 2013-03-06 ENCOUNTER — Ambulatory Visit: Payer: Medicare Other

## 2013-03-06 ENCOUNTER — Other Ambulatory Visit: Payer: Medicare Other

## 2013-03-18 ENCOUNTER — Ambulatory Visit
Admission: RE | Admit: 2013-03-18 | Discharge: 2013-03-18 | Disposition: A | Discharge status: Home / Self Care | Payer: Medicare Other | Source: Ambulatory Visit | Attending: Family Medicine | Admitting: Family Medicine

## 2013-03-18 DIAGNOSIS — M810 Age-related osteoporosis without current pathological fracture: Secondary | ICD-10-CM | POA: Diagnosis not present

## 2013-03-18 DIAGNOSIS — Z780 Asymptomatic menopausal state: Secondary | ICD-10-CM

## 2013-03-18 DIAGNOSIS — M899 Disorder of bone, unspecified: Secondary | ICD-10-CM | POA: Diagnosis not present

## 2013-03-18 DIAGNOSIS — Z1231 Encounter for screening mammogram for malignant neoplasm of breast: Secondary | ICD-10-CM

## 2013-05-20 DIAGNOSIS — Z79899 Other long term (current) drug therapy: Secondary | ICD-10-CM | POA: Diagnosis not present

## 2013-05-20 DIAGNOSIS — E038 Other specified hypothyroidism: Secondary | ICD-10-CM | POA: Diagnosis not present

## 2013-05-20 DIAGNOSIS — M810 Age-related osteoporosis without current pathological fracture: Secondary | ICD-10-CM | POA: Diagnosis not present

## 2013-05-20 DIAGNOSIS — K219 Gastro-esophageal reflux disease without esophagitis: Secondary | ICD-10-CM | POA: Diagnosis not present

## 2013-05-27 DIAGNOSIS — D0439 Carcinoma in situ of skin of other parts of face: Secondary | ICD-10-CM | POA: Diagnosis not present

## 2013-05-27 DIAGNOSIS — L909 Atrophic disorder of skin, unspecified: Secondary | ICD-10-CM | POA: Diagnosis not present

## 2013-05-27 DIAGNOSIS — L821 Other seborrheic keratosis: Secondary | ICD-10-CM | POA: Diagnosis not present

## 2013-05-27 DIAGNOSIS — C44319 Basal cell carcinoma of skin of other parts of face: Secondary | ICD-10-CM | POA: Diagnosis not present

## 2013-05-27 DIAGNOSIS — C4441 Basal cell carcinoma of skin of scalp and neck: Secondary | ICD-10-CM | POA: Diagnosis not present

## 2013-05-27 DIAGNOSIS — Z85828 Personal history of other malignant neoplasm of skin: Secondary | ICD-10-CM | POA: Diagnosis not present

## 2013-05-27 DIAGNOSIS — D0430 Carcinoma in situ of skin of unspecified part of face: Secondary | ICD-10-CM | POA: Diagnosis not present

## 2013-05-27 DIAGNOSIS — D485 Neoplasm of uncertain behavior of skin: Secondary | ICD-10-CM | POA: Diagnosis not present

## 2013-06-24 DIAGNOSIS — E038 Other specified hypothyroidism: Secondary | ICD-10-CM | POA: Diagnosis not present

## 2013-07-02 DIAGNOSIS — C44319 Basal cell carcinoma of skin of other parts of face: Secondary | ICD-10-CM | POA: Diagnosis not present

## 2013-07-02 DIAGNOSIS — Z85828 Personal history of other malignant neoplasm of skin: Secondary | ICD-10-CM | POA: Diagnosis not present

## 2013-10-09 ENCOUNTER — Encounter: Payer: Self-pay | Admitting: Cardiology

## 2013-10-24 DIAGNOSIS — J019 Acute sinusitis, unspecified: Secondary | ICD-10-CM | POA: Diagnosis not present

## 2014-01-15 DIAGNOSIS — K219 Gastro-esophageal reflux disease without esophagitis: Secondary | ICD-10-CM | POA: Diagnosis not present

## 2014-01-15 DIAGNOSIS — E038 Other specified hypothyroidism: Secondary | ICD-10-CM | POA: Diagnosis not present

## 2014-01-15 DIAGNOSIS — J309 Allergic rhinitis, unspecified: Secondary | ICD-10-CM | POA: Diagnosis not present

## 2014-01-15 DIAGNOSIS — Z9181 History of falling: Secondary | ICD-10-CM | POA: Diagnosis not present

## 2014-01-15 DIAGNOSIS — Z6832 Body mass index (BMI) 32.0-32.9, adult: Secondary | ICD-10-CM | POA: Diagnosis not present

## 2014-03-11 DIAGNOSIS — L909 Atrophic disorder of skin, unspecified: Secondary | ICD-10-CM | POA: Diagnosis not present

## 2014-03-11 DIAGNOSIS — L821 Other seborrheic keratosis: Secondary | ICD-10-CM | POA: Diagnosis not present

## 2014-03-11 DIAGNOSIS — L919 Hypertrophic disorder of the skin, unspecified: Secondary | ICD-10-CM | POA: Diagnosis not present

## 2014-03-11 DIAGNOSIS — Z85828 Personal history of other malignant neoplasm of skin: Secondary | ICD-10-CM | POA: Diagnosis not present

## 2014-03-31 DIAGNOSIS — H903 Sensorineural hearing loss, bilateral: Secondary | ICD-10-CM | POA: Diagnosis not present

## 2014-03-31 DIAGNOSIS — H905 Unspecified sensorineural hearing loss: Secondary | ICD-10-CM | POA: Diagnosis not present

## 2014-09-01 DIAGNOSIS — H43813 Vitreous degeneration, bilateral: Secondary | ICD-10-CM | POA: Diagnosis not present

## 2014-09-01 DIAGNOSIS — Z961 Presence of intraocular lens: Secondary | ICD-10-CM | POA: Diagnosis not present

## 2014-11-19 DIAGNOSIS — N390 Urinary tract infection, site not specified: Secondary | ICD-10-CM | POA: Diagnosis not present

## 2015-02-15 DIAGNOSIS — E039 Hypothyroidism, unspecified: Secondary | ICD-10-CM | POA: Diagnosis not present

## 2015-02-15 DIAGNOSIS — W57XXXA Bitten or stung by nonvenomous insect and other nonvenomous arthropods, initial encounter: Secondary | ICD-10-CM | POA: Diagnosis not present

## 2015-02-15 DIAGNOSIS — M791 Myalgia: Secondary | ICD-10-CM | POA: Diagnosis not present

## 2015-02-15 DIAGNOSIS — Z23 Encounter for immunization: Secondary | ICD-10-CM | POA: Diagnosis not present

## 2015-02-15 DIAGNOSIS — Z6832 Body mass index (BMI) 32.0-32.9, adult: Secondary | ICD-10-CM | POA: Diagnosis not present

## 2015-02-15 DIAGNOSIS — Z9181 History of falling: Secondary | ICD-10-CM | POA: Diagnosis not present

## 2015-02-15 DIAGNOSIS — R5383 Other fatigue: Secondary | ICD-10-CM | POA: Diagnosis not present

## 2015-03-10 DIAGNOSIS — R0600 Dyspnea, unspecified: Secondary | ICD-10-CM | POA: Diagnosis not present

## 2015-03-10 DIAGNOSIS — R600 Localized edema: Secondary | ICD-10-CM | POA: Diagnosis not present

## 2015-03-10 DIAGNOSIS — Z6832 Body mass index (BMI) 32.0-32.9, adult: Secondary | ICD-10-CM | POA: Diagnosis not present

## 2015-03-12 DIAGNOSIS — R600 Localized edema: Secondary | ICD-10-CM | POA: Diagnosis not present

## 2015-03-12 DIAGNOSIS — R918 Other nonspecific abnormal finding of lung field: Secondary | ICD-10-CM | POA: Diagnosis not present

## 2015-03-12 DIAGNOSIS — J9811 Atelectasis: Secondary | ICD-10-CM | POA: Diagnosis not present

## 2015-03-12 DIAGNOSIS — R0602 Shortness of breath: Secondary | ICD-10-CM | POA: Diagnosis not present

## 2015-03-12 DIAGNOSIS — Z85828 Personal history of other malignant neoplasm of skin: Secondary | ICD-10-CM | POA: Diagnosis not present

## 2015-03-12 DIAGNOSIS — R0600 Dyspnea, unspecified: Secondary | ICD-10-CM | POA: Diagnosis not present

## 2015-03-12 DIAGNOSIS — M7989 Other specified soft tissue disorders: Secondary | ICD-10-CM | POA: Diagnosis not present

## 2015-03-12 DIAGNOSIS — R609 Edema, unspecified: Secondary | ICD-10-CM | POA: Diagnosis not present

## 2015-03-18 DIAGNOSIS — L989 Disorder of the skin and subcutaneous tissue, unspecified: Secondary | ICD-10-CM | POA: Diagnosis not present

## 2015-03-18 DIAGNOSIS — Z6832 Body mass index (BMI) 32.0-32.9, adult: Secondary | ICD-10-CM | POA: Diagnosis not present

## 2015-03-18 DIAGNOSIS — S30860A Insect bite (nonvenomous) of lower back and pelvis, initial encounter: Secondary | ICD-10-CM | POA: Diagnosis not present

## 2015-03-18 DIAGNOSIS — R600 Localized edema: Secondary | ICD-10-CM | POA: Diagnosis not present

## 2015-03-18 DIAGNOSIS — S00461D Insect bite (nonvenomous) of right ear, subsequent encounter: Secondary | ICD-10-CM | POA: Diagnosis not present

## 2015-03-21 ENCOUNTER — Encounter (HOSPITAL_COMMUNITY): Payer: Self-pay | Admitting: Emergency Medicine

## 2015-03-21 ENCOUNTER — Emergency Department (HOSPITAL_COMMUNITY): Payer: Medicare Other

## 2015-03-21 ENCOUNTER — Observation Stay (HOSPITAL_COMMUNITY)
Admission: EM | Admit: 2015-03-21 | Discharge: 2015-03-23 | Disposition: A | Discharge status: Home / Self Care | Payer: Medicare Other | Attending: Family Medicine | Admitting: Family Medicine

## 2015-03-21 DIAGNOSIS — E860 Dehydration: Secondary | ICD-10-CM | POA: Diagnosis not present

## 2015-03-21 DIAGNOSIS — R55 Syncope and collapse: Secondary | ICD-10-CM | POA: Diagnosis not present

## 2015-03-21 DIAGNOSIS — Z91013 Allergy to seafood: Secondary | ICD-10-CM | POA: Diagnosis not present

## 2015-03-21 DIAGNOSIS — E785 Hyperlipidemia, unspecified: Secondary | ICD-10-CM | POA: Insufficient documentation

## 2015-03-21 DIAGNOSIS — Y9389 Activity, other specified: Secondary | ICD-10-CM | POA: Insufficient documentation

## 2015-03-21 DIAGNOSIS — W19XXXA Unspecified fall, initial encounter: Secondary | ICD-10-CM | POA: Insufficient documentation

## 2015-03-21 DIAGNOSIS — I440 Atrioventricular block, first degree: Secondary | ICD-10-CM | POA: Diagnosis not present

## 2015-03-21 DIAGNOSIS — M7989 Other specified soft tissue disorders: Secondary | ICD-10-CM | POA: Diagnosis not present

## 2015-03-21 DIAGNOSIS — S0990XA Unspecified injury of head, initial encounter: Secondary | ICD-10-CM | POA: Diagnosis not present

## 2015-03-21 DIAGNOSIS — I872 Venous insufficiency (chronic) (peripheral): Secondary | ICD-10-CM | POA: Insufficient documentation

## 2015-03-21 DIAGNOSIS — Y998 Other external cause status: Secondary | ICD-10-CM | POA: Insufficient documentation

## 2015-03-21 DIAGNOSIS — Z880 Allergy status to penicillin: Secondary | ICD-10-CM | POA: Insufficient documentation

## 2015-03-21 DIAGNOSIS — W101XXA Fall (on)(from) sidewalk curb, initial encounter: Secondary | ICD-10-CM | POA: Insufficient documentation

## 2015-03-21 DIAGNOSIS — I209 Angina pectoris, unspecified: Secondary | ICD-10-CM | POA: Diagnosis not present

## 2015-03-21 DIAGNOSIS — R42 Dizziness and giddiness: Secondary | ICD-10-CM | POA: Insufficient documentation

## 2015-03-21 DIAGNOSIS — S299XXA Unspecified injury of thorax, initial encounter: Secondary | ICD-10-CM | POA: Diagnosis not present

## 2015-03-21 DIAGNOSIS — S3992XA Unspecified injury of lower back, initial encounter: Secondary | ICD-10-CM | POA: Diagnosis not present

## 2015-03-21 DIAGNOSIS — Y9289 Other specified places as the place of occurrence of the external cause: Secondary | ICD-10-CM | POA: Diagnosis not present

## 2015-03-21 DIAGNOSIS — R531 Weakness: Secondary | ICD-10-CM | POA: Insufficient documentation

## 2015-03-21 DIAGNOSIS — E039 Hypothyroidism, unspecified: Secondary | ICD-10-CM | POA: Diagnosis not present

## 2015-03-21 DIAGNOSIS — R079 Chest pain, unspecified: Secondary | ICD-10-CM | POA: Diagnosis not present

## 2015-03-21 DIAGNOSIS — I10 Essential (primary) hypertension: Secondary | ICD-10-CM | POA: Diagnosis not present

## 2015-03-21 DIAGNOSIS — R404 Transient alteration of awareness: Secondary | ICD-10-CM | POA: Diagnosis not present

## 2015-03-21 DIAGNOSIS — M545 Low back pain: Secondary | ICD-10-CM | POA: Diagnosis not present

## 2015-03-21 LAB — CBC
HEMATOCRIT: 43.6 % (ref 36.0–46.0)
Hemoglobin: 14.6 g/dL (ref 12.0–15.0)
MCH: 30.9 pg (ref 26.0–34.0)
MCHC: 33.5 g/dL (ref 30.0–36.0)
MCV: 92.4 fL (ref 78.0–100.0)
Platelets: 230 10*3/uL (ref 150–400)
RBC: 4.72 MIL/uL (ref 3.87–5.11)
RDW: 13.4 % (ref 11.5–15.5)
WBC: 8.1 10*3/uL (ref 4.0–10.5)

## 2015-03-21 LAB — BRAIN NATRIURETIC PEPTIDE: B Natriuretic Peptide: 34.4 pg/mL (ref 0.0–100.0)

## 2015-03-21 LAB — BASIC METABOLIC PANEL
ANION GAP: 9 (ref 5–15)
BUN: 13 mg/dL (ref 6–20)
CALCIUM: 9.7 mg/dL (ref 8.9–10.3)
CO2: 27 mmol/L (ref 22–32)
Chloride: 105 mmol/L (ref 101–111)
Creatinine, Ser: 0.61 mg/dL (ref 0.44–1.00)
GFR calc Af Amer: >60 mL/min (ref >60)
Glucose, Bld: 107 mg/dL — ABNORMAL HIGH (ref 65–99)
Potassium: 4 mmol/L (ref 3.5–5.1)
Sodium: 141 mmol/L (ref 135–145)

## 2015-03-21 LAB — I-STAT TROPONIN, ED: Troponin i, poc: 0.02 ng/mL (ref 0.00–0.08)

## 2015-03-21 LAB — TSH: TSH: 0.201 u[IU]/mL — AB (ref 0.350–4.500)

## 2015-03-21 LAB — GLUCOSE, CAPILLARY: Glucose-Capillary: 122 mg/dL — ABNORMAL HIGH (ref 65–99)

## 2015-03-21 MED ORDER — ACETAMINOPHEN 500 MG PO TABS
1000.0000 mg | ORAL_TABLET | Freq: Four times a day (QID) | ORAL | Status: DC | PRN
  Administered 2015-03-22: 1000 mg via ORAL
  Filled 2015-03-21: qty 2

## 2015-03-21 MED ORDER — ASPIRIN EC 81 MG PO TBEC
81.0000 mg | DELAYED_RELEASE_TABLET | Freq: Every day | ORAL | Status: DC
  Administered 2015-03-22 – 2015-03-23 (×2): 81 mg via ORAL
  Filled 2015-03-21 (×2): qty 1

## 2015-03-21 MED ORDER — NITROGLYCERIN 0.4 MG SL SUBL: 0.4000 mg | SUBLINGUAL_TABLET | SUBLINGUAL | Status: DC | PRN

## 2015-03-21 MED ORDER — LEVOTHYROXINE SODIUM 50 MCG PO TABS
50.0000 ug | ORAL_TABLET | Freq: Every day | ORAL | Status: DC
  Administered 2015-03-22 – 2015-03-23 (×2): 50 ug via ORAL
  Filled 2015-03-21 (×3): qty 1

## 2015-03-21 MED ORDER — ENOXAPARIN SODIUM 40 MG/0.4ML ~~LOC~~ SOLN
40.0000 mg | SUBCUTANEOUS | Status: DC
  Administered 2015-03-21 – 2015-03-22 (×2): 40 mg via SUBCUTANEOUS
  Filled 2015-03-21 (×3): qty 0.4

## 2015-03-21 MED ORDER — SODIUM CHLORIDE 0.9 % IV SOLN: 250.0000 mL | INTRAVENOUS | Status: DC | PRN

## 2015-03-21 MED ORDER — LORATADINE 10 MG PO TABS
10.0000 mg | ORAL_TABLET | Freq: Every day | ORAL | Status: DC
  Filled 2015-03-21 (×2): qty 1

## 2015-03-21 MED ORDER — SODIUM CHLORIDE 0.9 % IJ SOLN: 3.0000 mL | INTRAMUSCULAR | Status: DC | PRN

## 2015-03-21 MED ORDER — MORPHINE SULFATE 2 MG/ML IJ SOLN: 1.0000 mg | INTRAMUSCULAR | Status: DC | PRN

## 2015-03-21 MED ORDER — SODIUM CHLORIDE 0.9 % IJ SOLN: 3.0000 mL | Freq: Two times a day (BID) | INTRAMUSCULAR | Status: DC

## 2015-03-21 MED ORDER — SODIUM CHLORIDE 0.9 % IV SOLN
INTRAVENOUS | Status: AC
  Administered 2015-03-21 – 2015-03-22 (×2): via INTRAVENOUS

## 2015-03-21 MED ORDER — SODIUM CHLORIDE 0.9 % IJ SOLN
3.0000 mL | Freq: Two times a day (BID) | INTRAMUSCULAR | Status: DC
  Administered 2015-03-22 – 2015-03-23 (×2): 3 mL via INTRAVENOUS

## 2015-03-21 NOTE — ED Provider Notes (Signed)
CSN: [id number]     Arrival date & time 03/21/15  1449 History   First MD Initiated Contact with Patient 03/21/15 1520     Chief Complaint  Patient presents with  . Dizziness  . Fall  . Chest Pain    HPI Pt has been having some trouble with dizziness and weakness for several weeks.  She saw her PCP and has had various tests.  She was checked for CHF, and blood clots.  She was started on some diuretics.  Her doctor sent her to the ED at [facility] center for additional testing and she describes having tests to evalaute for blood clots in her legs which were negative.  Last night her legs were burning and she did not sleep well.  She was moving something off the back of her commode when she fell backwards and landed on her bottom.  She was unable to get up.  She felt lightheaded so she came to the ED. Past Medical History  Diagnosis Date  . Hyperlipidemia   . Hypertension   . Thyroid disease    Past Surgical History  Procedure Laterality Date  . Appendectomy    . Partial hysterectomy    . Hemorroidectomy    . Tonsillectomy and adenoidectomy    . Fracture surgery    . Orif tibia & fibula fractures      right leg  . Left arm fracture    . Shoulder arthroscopy      left   Family History  Problem Relation Age of Onset  . Cancer     History  Substance Use Topics  . Smoking status: Never Smoker   . Smokeless tobacco: Not on file  . Alcohol Use: No   OB History    No data available     Review of Systems  Constitutional: Negative for fever.  Respiratory: Positive for chest tightness and shortness of breath (when she talks).   Cardiovascular: Positive for chest pain.  Gastrointestinal: Negative for vomiting and diarrhea.  Genitourinary: Negative for dysuria.  Neurological: Positive for weakness. Negative for seizures, speech difficulty, light-headedness and numbness.  All other systems reviewed and are negative.     Allergies  Penicillins and Shrimp  Home  Medications   Prior to Admission medications   Medication Sig Start Date End Date Taking? Authorizing Provider  acetaminophen (TYLENOL) 500 MG tablet Take 1,000 mg by mouth every 6 (six) hours as needed for moderate pain.   Yes Historical Provider, MD  aspirin 325 MG EC tablet Take 325 mg by mouth every 6 (six) hours as needed for pain.   Yes Historical Provider, MD  aspirin EC 81 MG tablet Take 81 mg by mouth daily.   Yes Historical Provider, MD  cetirizine (ZYRTEC) 10 MG tablet Take 10 mg by mouth as needed for allergies.   Yes Historical Provider, MD  furosemide (LASIX) 20 MG tablet Take 20 mg by mouth daily.   Yes Historical Provider, MD  ketorolac (TORADOL) 10 MG tablet Take 10 mg by mouth every 6 (six) hours as needed for moderate pain.   Yes Historical Provider, MD  levothyroxine (SYNTHROID, LEVOTHROID) 50 MCG tablet Take 50 mcg by mouth daily before breakfast.   Yes Historical Provider, MD  Multiple Vitamin (MULTIVITAMIN WITH MINERALS) TABS tablet Take 1 tablet by mouth daily.   Yes Historical Provider, MD  nitroGLYCERIN (NITROSTAT) 0.4 MG SL tablet Place 0.4 mg under the tongue every 5 (five) minutes as needed for chest pain.  Historical Provider, MD   BP 129/53 mmHg  Pulse 79  Temp(Src) 97.8 F (36.6 C) (Oral)  Resp 18  Ht 4\' 11"  (1.499 m)  Wt 175 lb (79.379 kg)  BMI 35.33 kg/m2  SpO2 99% Physical Exam  Constitutional: She is oriented to person, place, and time. She appears well-developed and well-nourished. No distress.  HENT:  Head: Normocephalic and atraumatic.  Right Ear: External ear normal.  Left Ear: External ear normal.  Mouth/Throat: Oropharynx is clear and moist.  Eyes: Conjunctivae are normal. Right eye exhibits no discharge. Left eye exhibits no discharge. No scleral icterus.  Neck: Neck supple. No tracheal deviation present.  Cardiovascular: Normal rate, regular rhythm and intact distal pulses.   Pulmonary/Chest: Effort normal and breath sounds normal. No  stridor. No respiratory distress. She has no wheezes. She has no rales.  Abdominal: Soft. Bowel sounds are normal. She exhibits no distension. There is no tenderness. There is no rebound and no guarding.  Musculoskeletal: She exhibits no edema or tenderness.  Neurological: She is alert and oriented to person, place, and time. She has normal strength. No cranial nerve deficit (no facial droop, extraocular movements intact, no slurred speech) or sensory deficit. She exhibits normal muscle tone. She displays no seizure activity. Coordination normal.  5/5 stregnth bilateral upper and lower extremities, sensation intact in all extremities, no visual field cuts, no left or right sided neglect, no nystagmus noted   Skin: Skin is warm and dry. No rash noted. She is not diaphoretic.  Psychiatric: She has a normal mood and affect.  Nursing note and vitals reviewed.   ED Course  Procedures (including critical care time) Labs Review Labs Reviewed  BASIC METABOLIC PANEL - Abnormal; Notable for the following:    Glucose, Bld 107 (*)    All other components within normal limits  CBC  BRAIN NATRIURETIC PEPTIDE  I-STAT TROPOININ, ED    Imaging Review Dg Chest 2 View  03/21/2015   CLINICAL DATA:  Pain following fall.  Dizziness.  EXAM: CHEST  2 VIEW  COMPARISON:  December 18, 2012  FINDINGS: There is minimal scarring in the left base. There is no edema or consolidation. The heart size and pulmonary vascularity are normal. No adenopathy. No pneumothorax. There is chronic arthropathy in the left shoulder region. Anterior wedging of a lower thoracic vertebral body is stable.  IMPRESSION: No edema or consolidation.  Stable slight scarring left base.   Electronically Signed   By: Lowella Grip III M.D.   On: 03/21/2015 16:54   Dg Lumbar Spine Complete  03/21/2015   CLINICAL DATA:  Pain following fall  EXAM: LUMBAR SPINE - COMPLETE 4+ VIEW  COMPARISON:  September 28, 2005  FINDINGS: Frontal, lateral, spot  lumbosacral lateral, and bilateral oblique views were obtained. There are 5 non-rib-bearing lumbar type vertebral bodies. There is chronic anterior wedging of the T12 vertebral body. No new fracture. No spondylolisthesis. There is moderate disc space narrowing at L3-4 and L4-5 and slight disc space narrowing at L5-S1, progressed from 2006 study. There is facet osteoarthritic change at L4-5 and L5-S1 bilaterally.  IMPRESSION: Stable anterior wedging of the T12 vertebral body. No new fracture. No spondylolisthesis. Osteoarthritic changes several levels, progressed from 2006 study.   Electronically Signed   By: Lowella Grip III M.D.   On: 03/21/2015 16:56   Ct Head Wo Contrast  03/21/2015   CLINICAL DATA:  Fall with dizziness  EXAM: CT HEAD WITHOUT CONTRAST  TECHNIQUE: Contiguous axial images were obtained  from the base of the skull through the vertex without intravenous contrast.  COMPARISON:  06/05/2012  FINDINGS: Skull and Sinuses:Negative for fracture or destructive process. The mastoids, middle ears, and imaged paranasal sinuses are clear.  Orbits: Bilateral cataract resection.  No traumatic findings.  Brain: No evidence of acute infarction, hemorrhage, obstructive hydrocephalus, or mass lesion/mass effect. Low-density around the lateral ventricles is compatible with mild chronic small vessel disease, better seen on brain MRI from 2012. Possible and presumably remote infarct in the left pons, although streak artifact could also give this appearance. Ventriculomegaly is prominent for degree of atrophic sulcal widening, but has been stable since 2013.  IMPRESSION: 1. No evidence of intracranial injury. 2. Brain atrophy and ventriculomegaly, stable from 2013.   Electronically Signed   By: Monte Fantasia M.D.   On: 03/21/2015 17:54     EKG Interpretation   Date/Time:  Sunday March 21 2015 15:01:39 EDT Ventricular Rate:  76 PR Interval:  197 QRS Duration: 99 QT Interval:  415 QTC Calculation: 467 R  Axis:   -30 Text Interpretation:  Sinus rhythm Left axis deviation Probable  anteroseptal infarct, old No significant change since last tracing  Confirmed by Sharmon Cheramie  MD-J, Nikolaus Pienta ([id number]) on 03/21/2015 3:08:44 PM      MDM   Final diagnoses:  Near syncope    Pt has history of having some trouble like this in the past.  She had a workup several years ago but none since.  Question whether she is having cardiac arrythmias or hypotension.  Will consult regarding admission for monitoring.    Dorie Rank, MD 03/21/15 [phone]

## 2015-03-21 NOTE — Discharge Summary (Signed)
[facility] Discharge Summary  Patient name: Lauren Moreno Medical record number: [id number] Date of birth: [birth year] Age: 81 y.o. Gender: female Date of Admission: 03/21/2015  Date of Discharge: 03/23/2015 Admitting Physician: Zenia Resides, MD  Primary Care Provider: Leonides Sake, MD Consultants: none  Indication for Hospitalization: pre-syncope  Discharge Diagnoses/Problem List:  Chronic venous insufficiency Dehydration Fall Near syncope Generalized Weakness  Disposition: Discharge home with son  Discharge Condition: Stable  Discharge Exam:  Blood pressure 112/64, pulse 76, temperature 98.3 F (36.8 C), temperature source Oral, resp. rate 18, height _0  (1.499 m), weight 171 lb 3.2 oz (77.656 kg), SpO2 98 %. General: asleep in bed, NAD Cardiovascular: RRR, no murmurs, +2 DP Respiratory: CTAB, no wheeze, no increased WOB Abdomen: soft, NT/ND, +BS Extremities: WWP, trace pedal edema, +generalized TTP to LE (apparently chronic per patient) Neuro: follows commands, no focal deficits Psych: pleasant, mood stable Skin: varicose veins in LE, no rashes  Brief Hospital Course:  Lauren Moreno is a 81 y.o. female that presented with a fall due to near syncope. PMH is significant for HTN.  In ED, CT head was negative for infarct or bleed, glucose 107.  Patient was hemodynamically stable.  She appeared mildly dehydrated and was admitted to [facility] for evaluation.  She was bolused with IVF and continued on maintenance.  Home lasix was discontinued.  Orthostatic vital signs were obtained and were positive.  Echocardiogram was obtained and showed Grade 1 diastolic dysfnx with EF 70-35%.  PT/OT evaluated patient as well and recommended continued use of cane outpatient but no outpatient PT/OT needed.  ESR also obtained with concern for possible giant cell arteritis vs polymyalgia rheumatica.  ESR was 26.  Patient's symptoms were improved with fluid.   She was asymptomatic at discharge.  Because patient had a h/o angina.  Troponins were obtained and were negative.  EKG was nonischemic.  TSH low 0.201.  Synthroid dose was NOT adjusted.   Patient was discharged in stable condition.  Discharge instructions and return precautions were reviewed with patient and her son, who voiced good understanding.  They were encouraged to follow up with patient's PCP within the next week.  Issues for Follow Up:  1. LE swelling/pain.  Patient to purchase compression stockings.  Lasix discontinued 2. Repeat TSH in 3 months.  Synthroid continued at home dose.  TSH 0.201 in hospital.  Significant Procedures: none  Significant Labs and Imaging:   Recent Labs Lab 03/21/15 1601 03/22/15 0442  WBC 8.1 7.4  HGB 14.6 13.9  HCT 43.6 42.1  PLT 230 218    Recent Labs Lab 03/21/15 1601 03/22/15 0442  NA 141 141  K 4.0 3.8  CL 105 108  CO2 27 27  GLUCOSE 107* 100*  BUN 13 11  CREATININE 0.61 0.47  CALCIUM 9.7 8.9  ALKPHOS  --  65  AST  --  18  ALT  --  11*  ALBUMIN  --  2.8*   Erythrocyte Sedimentation Rate     Component Value Date/Time   ESRSEDRATE 25* 03/22/2015 1132   Study Conclusions  - Left ventricle: The cavity size was normal. Wall thickness was normal. Systolic function was normal. The estimated ejection fraction was in the range of 60% to 65%. Wall motion was normal; there were no regional wall motion abnormalities. Doppler parameters are consistent with abnormal left ventricular relaxation (grade 1 diastolic dysfunction).  Left ventricle: The cavity size was normal. Wall thickness was normal. Systolic  function was normal. The estimated ejection fraction was in the range of 60% to 65%. Wall motion was normal; there were no regional wall motion abnormalities. Doppler parameters are consistent with abnormal left ventricular relaxation (grade 1 diastolic  dysfunction).  ------------------------------------------------------------------- Aortic valve:  Structurally normal valve.  Cusp separation was normal. Doppler: Transvalvular velocity was within the normal range. There was no stenosis. There was no regurgitation.  ------------------------------------------------------------------- Aorta: Aortic root: The aortic root was normal in size. Ascending aorta: The ascending aorta was normal in size.  ------------------------------------------------------------------- Mitral valve:  Structurally normal valve.  Leaflet separation was normal. Doppler: Transvalvular velocity was within the normal range. There was no evidence for stenosis. There was no regurgitation.  Peak gradient (D): 3 mm Hg.  ------------------------------------------------------------------- Left atrium: The atrium was normal in size.  ------------------------------------------------------------------- Right ventricle: The cavity size was normal. Systolic function was normal.  ------------------------------------------------------------------- Pulmonic valve:  The valve appears to be grossly normal. Doppler: There was no significant regurgitation.  ------------------------------------------------------------------- Tricuspid valve:  Structurally normal valve.  Leaflet separation was normal. Doppler: Transvalvular velocity was within the normal range. There was no regurgitation.  ------------------------------------------------------------------- Right atrium: The atrium was normal in size.  ------------------------------------------------------------------- Pericardium: There was no pericardial effusion.  EKG: Sinus with first degree AV block  Results/Tests Pending at Time of Discharge: none  Discharge Medications:    Medication List    STOP taking these medications        furosemide 20 MG tablet  Commonly known as:  LASIX      ketorolac 10 MG tablet  Commonly known as:  TORADOL      TAKE these medications        acetaminophen 500 MG tablet  Commonly known as:  TYLENOL  Take 1,000 mg by mouth every 6 (six) hours as needed for moderate pain.     aspirin EC 81 MG tablet  Take 81 mg by mouth daily.     cetirizine 10 MG tablet  Commonly known as:  ZYRTEC  Take 10 mg by mouth as needed for allergies.     levothyroxine 50 MCG tablet  Commonly known as:  SYNTHROID, LEVOTHROID  Take 50 mcg by mouth daily before breakfast.     multivitamin with minerals Tabs tablet  Take 1 tablet by mouth daily.     nitroGLYCERIN 0.4 MG SL tablet  Commonly known as:  NITROSTAT  Place 0.4 mg under the tongue every 5 (five) minutes as needed for chest pain.        Discharge Instructions: Please refer to Patient Instructions section of EMR for full details.  Patient was counseled important signs and symptoms that should prompt return to medical care, changes in medications, dietary instructions, activity restrictions, and follow up appointments.   Follow-Up Appointments: Follow-up Information    Follow up with [facility], MD. Schedule an appointment as soon as possible for a visit in 1 week.   Specialty:  Family Medicine   Why:  hospital follow up   Contact information:   Dr. Daiva Eves [place] [id number] [place], DO 03/23/2015, 9:26 AM PGY-1, [place]

## 2015-03-21 NOTE — ED Notes (Signed)
Pt from home via [facility] with multiple complaints.  Pt reports she has had increasing weakness and dizziness for "a while."  Was seen at [facility] on the 10th and dx with pedal edema and generalized weakness, sent home on lasix.  Pt reports today she was leaning over cleaning her toilet seat and when she stood up she got dizzy and fell back on her buttocks.  During transport pt began having chest tightness, hx of the same with angina.  Given 324 mg aspirin.  Denies SOB, vomiting.  Pt in NAD, A&O.

## 2015-03-21 NOTE — H&P (Signed)
[facility] Admission History and Physical Service Pager: [phone]  Patient name: Lauren Moreno Medical record number: [id number] Date of birth: 22-May-1928 Age: 81 y.o. Gender: female  Primary Care Provider: Leonides Sake, MD Consultants: None Code Status: DNI, otherwise full resuscitation  Chief Complaint: Near syncope  Assessment and Plan: Lauren Moreno is a 81 y.o. female presenting with a fall due to near syncope. PMH is significant for HTN  Near syncope: Likely orthostatic etiology in setting of recently starting lasix for LE edema. Also dehydrated on exam and has been prone to falling. Not vertiginous. CT head without infarct or bleed. Glucose 107, no oral hypoglycemics or other psychotropics. No lumbar fractures/spondylolisthesis. - Check orthostatic VS's - Reportedly 5 lbs below normal weight. Will give 500cc bolus and maintenance IV fluids x 10 hrs and heart healthy diet - Discontinue lasix. Should not continue this on discharge unless systolic dysfunction found on echo. - Echocardiogram (Previous 10/07/2010 showed: EF 55-60% G1DD, no wall motion abnormalities, PE pressure 63mmHg) - PT/OT evaluation for generalized weakness. Uses cane and walker at home. - Tylenol prn pain.  - I/O, daily weights  History of angina: Not currently experiencing chest pain. Troponins neg x2. ECG nonischemic. Will reorder home medications and prns.  - Aspirin 81mg , oxygen prn (not hypoxemic), morphine and nitro prn chest pain. Oxygen prn.  Venous insufficiency: Most likely cause of LE edema as BNP is, Cr is normal, and no signs or symptoms of hepatic problems.  - TED hose   Hypothyroidism: History of TSH 6.174, treated with synthroid. Was stopped at some point and restarted about 3 weeks ago. In setting of weakness and near syncope, will check TSH, though HR and other symptoms don't suggest this as a likely culprit.  - Check TSH - Continue low dose synthroid  65mcg   FEN/GI: NS at 100 cc/hr x 10 hrs, heart healthy diet Prophylaxis: Lovenox  Disposition: Admit to telemetry on FMTS, Dr. Andria Frames  History of Present Illness: Lauren Moreno is a 81 y.o. female presenting after feeling lightheaded after standing up, then falling backward onto her bottom concerning for near syncope.  She had an episode of severe lightheadedness earlier today when rising from a stooped position . She felt very dizzy and fell backwards on her buttocks. No loss of consciousness. She pressed her life alert button and called her son. She has a history of angina but has not had this recently, though she describes very transient chest tightness en route that resolved PTA. Denies shortness of breath during all of this. Never any palpitations. Reports episodes like this have been happening more often and she is always unable to get up once fallen, hence the life alert.   She has had lower leg swelling that improves with elevation. He PCP recommended continued elevation and prescribed compression stockings which Ms. Brick has not been able to pick up yet. No history of liver or renal disease. She was placed on lasix about 2 weeks ago for this. This has been concomitant with increasing weakness and lightheadedness. A blood test showed she doesn't have heart failure and bilateral leg U/S showed no evidence of DVT last week.   She lives alone and has 1 son (Gene who is present in the ED room) and 2 daughters living locally. Rides on lawnmower, cleans up around her house. Gets around with a cane and a walker at home. 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   Review Of  Systems: Per HPI with the following additions:  Otherwise 12 point review of systems was performed and was unremarkable.  There are no active problems to display for this patient.  Past Medical History: Past Medical History  Diagnosis Date  . Hyperlipidemia   . Hypertension   . Thyroid disease     Past Surgical History: Past Surgical History  Procedure Laterality Date  . Appendectomy    . Partial hysterectomy    . Hemorroidectomy    . Tonsillectomy and adenoidectomy    . Fracture surgery    . Orif tibia & fibula fractures      right leg  . Left arm fracture    . Shoulder arthroscopy      left   Social History: History  Substance Use Topics  . Smoking status: Never Smoker   . Smokeless tobacco: Not on file  . Alcohol Use: No    Please also refer to relevant sections of EMR.  Family History: Family History  Problem Relation Age of Onset  . Cancer     Allergies and Medications: Allergies  Allergen Reactions  . Penicillins Anaphylaxis  . Shrimp [Shellfish Allergy] Anaphylaxis   No current facility-administered medications on file prior to encounter.   Current Outpatient Prescriptions on File Prior to Encounter  Medication Sig Dispense Refill  . nitroGLYCERIN (NITROSTAT) 0.4 MG SL tablet Place 0.4 mg under the tongue every 5 (five) minutes as needed for chest pain.       Objective: BP 127/54 mmHg  Pulse 76  Temp(Src) 97.8 F (36.6 C) (Oral)  Resp 14  Ht 4\' 11"  (1.499 m)  Wt 170 lb 1.6 oz (77.157 kg)  BMI 34.34 kg/m2  SpO2 98% Exam: General: Meek but well-appearing elderly female laying in bed in no distress Eyes: Arcus senilis, subtle cataracts, PERRL ENTM: oropharynx dry, clear. Ear canals patent with normal TMs. Neck: Supple, no thyromegaly. Cardiovascular: Regular rate, no murmur, no JVD, 1+ pitting edema bilateral LEs Respiratory: Nonlabored, 100% on RA, Clear throughout Abdomen: +BS, soft, NT, ND MSK: No deformities Skin: No rashes or ecchymoses. Punctate laceration of right palm healing well. LE varicosities without stasis changes.  Neuro: Alert and oriented, slow hesitant speech with occasional word blocking. CN II-XII intact. Normal speech. Stuttering gait with very short steps. Romberg neg.   Labs and Imaging: CBC BMET   Recent  Labs Lab 03/21/15 1601  WBC 8.1  HGB 14.6  HCT 43.6  PLT 230    Recent Labs Lab 03/21/15 1601  NA 141  K 4.0  CL 105  CO2 27  BUN 13  CREATININE 0.61  GLUCOSE 107*  CALCIUM 9.7     ECG: NSR, LAD, diffuse artifact but no ischemia or signs of LVH or strain. Dg Chest 2 View  03/21/2015   CLINICAL DATA:  Pain following fall.  Dizziness.  EXAM: CHEST  2 VIEW  COMPARISON:  December 18, 2012  FINDINGS: There is minimal scarring in the left base. There is no edema or consolidation. The heart size and pulmonary vascularity are normal. No adenopathy. No pneumothorax. There is chronic arthropathy in the left shoulder region. Anterior wedging of a lower thoracic vertebral body is stable.  IMPRESSION: No edema or consolidation.  Stable slight scarring left base.   Electronically Signed   By: Lowella Grip III M.D.   On: 03/21/2015 16:54   Dg Lumbar Spine Complete  03/21/2015   CLINICAL DATA:  Pain following fall  EXAM: LUMBAR SPINE - COMPLETE 4+ VIEW  COMPARISON:  September 28, 2005  FINDINGS: Frontal, lateral, spot lumbosacral lateral, and bilateral oblique views were obtained. There are 5 non-rib-bearing lumbar type vertebral bodies. There is chronic anterior wedging of the T12 vertebral body. No new fracture. No spondylolisthesis. There is moderate disc space narrowing at L3-4 and L4-5 and slight disc space narrowing at L5-S1, progressed from 2006 study. There is facet osteoarthritic change at L4-5 and L5-S1 bilaterally.  IMPRESSION: Stable anterior wedging of the T12 vertebral body. No new fracture. No spondylolisthesis. Osteoarthritic changes several levels, progressed from 2006 study.   Electronically Signed   By: Lowella Grip III M.D.   On: 03/21/2015 16:56   Ct Head Wo Contrast  03/21/2015   CLINICAL DATA:  Fall with dizziness  EXAM: CT HEAD WITHOUT CONTRAST  TECHNIQUE: Contiguous axial images were obtained from the base of the skull through the vertex without intravenous contrast.   COMPARISON:  06/05/2012  FINDINGS: Skull and Sinuses:Negative for fracture or destructive process. The mastoids, middle ears, and imaged paranasal sinuses are clear.  Orbits: Bilateral cataract resection.  No traumatic findings.  Brain: No evidence of acute infarction, hemorrhage, obstructive hydrocephalus, or mass lesion/mass effect. Low-density around the lateral ventricles is compatible with mild chronic small vessel disease, better seen on brain MRI from 2012. Possible and presumably remote infarct in the left pons, although streak artifact could also give this appearance. Ventriculomegaly is prominent for degree of atrophic sulcal widening, but has been stable since 2013.  IMPRESSION: 1. No evidence of intracranial injury. 2. Brain atrophy and ventriculomegaly, stable from 2013.   Electronically Signed   By: Monte Fantasia M.D.   On: 03/21/2015 17:54    Patrecia Pour, MD 03/21/2015, 6:49 PM PGY-2, [place] Intern pager: [phone], text pages welcome

## 2015-03-22 ENCOUNTER — Observation Stay (HOSPITAL_BASED_OUTPATIENT_CLINIC_OR_DEPARTMENT_OTHER): Payer: Medicare Other

## 2015-03-22 DIAGNOSIS — W19XXXA Unspecified fall, initial encounter: Secondary | ICD-10-CM | POA: Insufficient documentation

## 2015-03-22 DIAGNOSIS — R531 Weakness: Secondary | ICD-10-CM | POA: Insufficient documentation

## 2015-03-22 DIAGNOSIS — I872 Venous insufficiency (chronic) (peripheral): Secondary | ICD-10-CM

## 2015-03-22 DIAGNOSIS — E860 Dehydration: Secondary | ICD-10-CM | POA: Insufficient documentation

## 2015-03-22 DIAGNOSIS — R55 Syncope and collapse: Secondary | ICD-10-CM

## 2015-03-22 LAB — COMPREHENSIVE METABOLIC PANEL
ALT: 11 U/L — ABNORMAL LOW (ref 14–54)
AST: 18 U/L (ref 15–41)
Albumin: 2.8 g/dL — ABNORMAL LOW (ref 3.5–5.0)
Alkaline Phosphatase: 65 U/L (ref 38–126)
Anion gap: 6 (ref 5–15)
BILIRUBIN TOTAL: 0.5 mg/dL (ref 0.3–1.2)
BUN: 11 mg/dL (ref 6–20)
CHLORIDE: 108 mmol/L (ref 101–111)
CO2: 27 mmol/L (ref 22–32)
Calcium: 8.9 mg/dL (ref 8.9–10.3)
Creatinine, Ser: 0.47 mg/dL (ref 0.44–1.00)
GFR calc non Af Amer: >60 mL/min (ref >60)
Glucose, Bld: 100 mg/dL — ABNORMAL HIGH (ref 65–99)
Potassium: 3.8 mmol/L (ref 3.5–5.1)
Sodium: 141 mmol/L (ref 135–145)
Total Protein: 5.8 g/dL — ABNORMAL LOW (ref 6.5–8.1)

## 2015-03-22 LAB — CBC
HCT: 42.1 % (ref 36.0–46.0)
HEMOGLOBIN: 13.9 g/dL (ref 12.0–15.0)
MCH: 30.5 pg (ref 26.0–34.0)
MCHC: 33 g/dL (ref 30.0–36.0)
MCV: 92.3 fL (ref 78.0–100.0)
Platelets: 218 10*3/uL (ref 150–400)
RBC: 4.56 MIL/uL (ref 3.87–5.11)
RDW: 13.5 % (ref 11.5–15.5)
WBC: 7.4 10*3/uL (ref 4.0–10.5)

## 2015-03-22 LAB — GLUCOSE, CAPILLARY: Glucose-Capillary: 97 mg/dL (ref 65–99)

## 2015-03-22 LAB — SEDIMENTATION RATE: Sed Rate: 25 mm/hr — ABNORMAL HIGH (ref 0–22)

## 2015-03-22 NOTE — Progress Notes (Signed)
SATURATION QUALIFICATIONS: (This note is used to comply with regulatory documentation for home oxygen)  Patient Saturations on Room Air at Rest = 100%  Patient Saturations on Room Air while Ambulating = 98%   Please briefly explain why patient needs home oxygen: Pt has no need for oxygen at rest or while ambulating.

## 2015-03-22 NOTE — Progress Notes (Signed)
Family Medicine Teaching Service Daily Progress Note Intern Pager: [phone]  Patient name: Lauren Moreno Medical record number: [id number] Date of birth: Aug 06, 1928 Age: 81 y.o. Gender: female  Primary Care Provider: Leonides Sake, MD Consultants: none Code Status: DNI  Pt Overview and Major Events to Date:  6/19: Admitted  Assessment and Plan: Lauren Moreno is a 81 y.o. female presenting with a fall due to near syncope. PMH is significant for HTN  Near syncope: Likely orthostatic etiology in setting of recently starting lasix for LE edema. Also dehydrated on exam and has been prone to falling. Not vertiginous. CT head without infarct or bleed. Glucose 107, no oral hypoglycemics or other psychotropics. No lumbar fractures/spondylolisthesis.  ECHO: 10/07/2010 showed: EF 55-60% G1DD, no wall motion abnormalities, PE pressure 75mHg - orthostatic VS's positive with 3 minutes of standing - Discontinue lasix for now. Should not continue this on discharge unless systolic dysfunction found on echo. - Echocardiogram pending - PT/OT evaluation for generalized weakness. Uses cane and walker at home. - Tylenol prn pain.  - I/O, daily weights: +2.3L, +1lb since admission - ESR ordered.  Considering PMR or Giant cell arteritis as a possible DDx for dizziness.  History of angina: Not currently experiencing chest pain. Troponins neg x2. ECG nonischemic. Will reorder home medications and prns.  - Aspirin 842m oxygen prn (not hypoxemic), morphine and nitro prn chest pain. Oxygen prn.  Venous insufficiency: Most likely cause of LE edema as BNP is, Cr is normal, and no signs or symptoms of hepatic problems.  - TED hose   Hypothyroidism: History of TSH 6.174, treated with synthroid. Was stopped at some point and restarted about 3 weeks ago. In setting of weakness and near syncope, will check TSH, though HR and other symptoms don't suggest this as a likely culprit.  - TSH 0.201, Recommend  repeating this in 3 months.  May need to decrease Synthroid dose. - Continue low dose synthroid 5013m  FEN/GI: NS at 100 cc/hr (to stop _0 ), heart healthy diet Prophylaxis: Lovenox  Disposition: Discharge pending evaluation.  Subjective:  Patient reports that she is feeling better today.  She continues to endorse intermittent dizziness, esp with positional change.  Denies CP, SOB, palpitation.  Son at bedside, who reports that he notices that she is more unsteady on feet since Lasix, getting dizzier with sudden turns.    Objective: Temp:  [97.8 F (36.6 C)-98.2 F (36.8 C)] 98.2 F (36.8 C) (06/20 0154) Pulse Rate:  [74-79] 75 (06/20 0154) Resp:  [14-21] 18 (06/20 0154) BP: (122-137)/(49-66) 137/49 mmHg (06/20 0154) SpO2:  [97 %-99 %] 98 % (06/20 0154) Weight:  [170 lb 1.6 oz (77.157 kg)-175 lb (79.379 kg)] 172 lb 9.9 oz (78.3 kg) (06/19 2021) Physical Exam: General: awake, alert, sitting up in bed having breakfast, NAD, son at bedside HEENT: [place]/AT, EOMI, MMM Cardiovascular: RRR, no murmurs, +2 radial pulses Respiratory: CTAB, no increased WOB, no wheeze Abdomen: soft, NT/ND, +BS Extremities: trace pedal edema, moves extremities independently, cool Neuro: follows commands, no focal deficits  Orthostatic VS for the past 24 hrs:  BP- Lying Pulse- Lying BP- Sitting Pulse- Sitting BP- Standing at 0 minutes Pulse- Standing at 0 minutes  03/22/15 0739 - - - - 156/75 mmHg 74  03/22/15 0737 - - 162/66 mmHg 74 - -  03/22/15 0736 145/56 mmHg 71 - - - -   Standing at 3 minutes 165/62, HR 75  Laboratory:  Recent Labs Lab 03/21/15 1601 03/22/15 0442  WBC  8.1 7.4  HGB 14.6 13.9  HCT 43.6 42.1  PLT 230 218    Recent Labs Lab 03/21/15 1601 03/22/15 0442  NA 141 141  K 4.0 3.8  CL 105 108  CO2 27 27  BUN 13 11  CREATININE 0.61 0.47  CALCIUM 9.7 8.9  PROT  --  5.8*  BILITOT  --  0.5  ALKPHOS  --  65  ALT  --  11*  AST  --  18  GLUCOSE 107* 100*     Imaging/Diagnostic Tests: Dg Chest 2 View  03/21/2015   CLINICAL DATA:  Pain following fall.  Dizziness.  EXAM: CHEST  2 VIEW  COMPARISON:  December 18, 2012  FINDINGS: There is minimal scarring in the left base. There is no edema or consolidation. The heart size and pulmonary vascularity are normal. No adenopathy. No pneumothorax. There is chronic arthropathy in the left shoulder region. Anterior wedging of a lower thoracic vertebral body is stable.  IMPRESSION: No edema or consolidation.  Stable slight scarring left base.   Electronically Signed   By: Lowella Grip III M.D.   On: 03/21/2015 16:54   Dg Lumbar Spine Complete  03/21/2015   CLINICAL DATA:  Pain following fall  EXAM: LUMBAR SPINE - COMPLETE 4+ VIEW  COMPARISON:  September 28, 2005  FINDINGS: Frontal, lateral, spot lumbosacral lateral, and bilateral oblique views were obtained. There are 5 non-rib-bearing lumbar type vertebral bodies. There is chronic anterior wedging of the T12 vertebral body. No new fracture. No spondylolisthesis. There is moderate disc space narrowing at L3-4 and L4-5 and slight disc space narrowing at L5-S1, progressed from 2006 study. There is facet osteoarthritic change at L4-5 and L5-S1 bilaterally.  IMPRESSION: Stable anterior wedging of the T12 vertebral body. No new fracture. No spondylolisthesis. Osteoarthritic changes several levels, progressed from 2006 study.   Electronically Signed   By: Lowella Grip III M.D.   On: 03/21/2015 16:56   Ct Head Wo Contrast  03/21/2015   CLINICAL DATA:  Fall with dizziness  EXAM: CT HEAD WITHOUT CONTRAST  TECHNIQUE: Contiguous axial images were obtained from the base of the skull through the vertex without intravenous contrast.  COMPARISON:  06/05/2012  FINDINGS: Skull and Sinuses:Negative for fracture or destructive process. The mastoids, middle ears, and imaged paranasal sinuses are clear.  Orbits: Bilateral cataract resection.  No traumatic findings.  Brain: No evidence  of acute infarction, hemorrhage, obstructive hydrocephalus, or mass lesion/mass effect. Low-density around the lateral ventricles is compatible with mild chronic small vessel disease, better seen on brain MRI from 2012. Possible and presumably remote infarct in the left pons, although streak artifact could also give this appearance. Ventriculomegaly is prominent for degree of atrophic sulcal widening, but has been stable since 2013.  IMPRESSION: 1. No evidence of intracranial injury. 2. Brain atrophy and ventriculomegaly, stable from 2013.   Electronically Signed   By: Monte Fantasia M.D.   On: 03/21/2015 17:54   Janora Norlander, DO 03/22/2015, 7:32 AM PGY-1, [place] Intern pager: [phone], text pages welcome

## 2015-03-22 NOTE — Evaluation (Signed)
Physical Therapy Evaluation Patient Details Name: Lauren Moreno MRN: [id number] DOB: Jun 29, 1928 Today's Date: 03/22/2015   History of Present Illness  Lauren Moreno is a 81 y.o. female presenting after feeling lightheaded after standing up, then falling backward onto her bottom concerning for near syncope.  Clinical Impression  Pt is at or close to baseline functioning and should be safe at home with PRN assist of family. There are no further acute PT needs.  Will sign off at this time.     Follow Up Recommendations No PT follow up    Equipment Recommendations  None recommended by PT    Recommendations for Other Services       Precautions / Restrictions Precautions Precautions: Fall      Mobility  Bed Mobility Overal bed mobility: Modified Independent                Transfers Overall transfer level: Modified independent                  Ambulation/Gait Ambulation/Gait assistance: Modified independent (Device/Increase time) Ambulation Distance (Feet): 180 Feet Assistive device: Rolling walker (2 wheeled);None (30 feet no device and mildly unsteady, but safe) Gait Pattern/deviations: Step-through pattern Gait velocity: slower   General Gait Details: generally steady in RW, more guarded without Assistive device  Stairs Stairs: Yes Stairs assistance: Supervision Stair Management: One rail Right;Alternating pattern;Forwards Number of Stairs: 3 General stair comments: needs rails, but otherwise safe.  Wheelchair Mobility    Modified Rankin (Stroke Patients Only)       Balance Overall balance assessment: Needs assistance Sitting-balance support: No upper extremity supported Sitting balance-Leahy Scale: Good     Standing balance support: No upper extremity supported Standing balance-Leahy Scale: Fair                               Pertinent Vitals/Pain Pain Assessment: No/denies pain    Home Living Family/patient expects  to be discharged to:: Private residence Living Arrangements: Alone Available Help at Discharge: Family;Available PRN/intermittently Type of Home: House Home Access: Stairs to enter Entrance Stairs-Rails: Left Entrance Stairs-Number of Steps: 3 Home Layout: One level Home Equipment: Walker - 4 wheels;Walker - 2 wheels      Prior Function Level of Independence: Independent with assistive device(s)               Hand Dominance        Extremity/Trunk Assessment   Upper Extremity Assessment: Overall WFL for tasks assessed (L shoulder limited flexion/abd)           Lower Extremity Assessment: Overall WFL for tasks assessed         Communication   Communication: HOH  Cognition Arousal/Alertness: Awake/alert Behavior During Therapy: WFL for tasks assessed/performed Overall Cognitive Status: Within Functional Limits for tasks assessed                      General Comments      Exercises        Assessment/Plan    PT Assessment Patent does not need any further PT services  PT Diagnosis     PT Problem List    PT Treatment Interventions     PT Goals (Current goals can be found in the Care Plan section) Acute Rehab PT Goals PT Goal Formulation: All assessment and education complete, DC therapy    Frequency     Barriers to discharge  Co-evaluation               End of Session   Activity Tolerance: Patient tolerated treatment well Patient left: in bed;with call bell/phone within reach;with family/visitor present Nurse Communication: Mobility status    Functional Assessment Tool Used: clinical judgement Functional Limitation: Mobility: Walking and moving around Mobility: Walking and Moving Around Current Status [phone]): 0 percent impaired, limited or restricted (supervision on steeper hospital stairs and no device.) Mobility: Walking and Moving Around Goal Status [phone]): 0 percent impaired, limited or restricted Mobility: Walking  and Moving Around Discharge Status [phone]): 0 percent impaired, limited or restricted    Time: 9528-4132 PT Time Calculation (min) (ACUTE ONLY): 21 min   Charges:   PT Evaluation $Initial PT Evaluation Tier I: 1 Procedure     PT G Codes:   PT G-Codes **NOT FOR INPATIENT CLASS** Functional Assessment Tool Used: clinical judgement Functional Limitation: Mobility: Walking and moving around Mobility: Walking and Moving Around Current Status (G4010): 0 percent impaired, limited or restricted (supervision on steeper hospital stairs and no device.) Mobility: Walking and Moving Around Goal Status (U7253): 0 percent impaired, limited or restricted Mobility: Walking and Moving Around Discharge Status (G6440): 0 percent impaired, limited or restricted    Lauren Moreno, Lauren Moreno 03/22/2015, 5:03 PM 03/22/2015  Lauren Moreno, Lauren Moreno [phone]  (pager)

## 2015-03-22 NOTE — Progress Notes (Signed)
  Echocardiogram 2D Echocardiogram has been performed.  Donata Clay 03/22/2015, 2:46 PM

## 2015-03-23 DIAGNOSIS — R55 Syncope and collapse: Secondary | ICD-10-CM | POA: Diagnosis not present

## 2015-03-23 DIAGNOSIS — W19XXXA Unspecified fall, initial encounter: Secondary | ICD-10-CM | POA: Diagnosis not present

## 2015-03-23 DIAGNOSIS — I10 Essential (primary) hypertension: Secondary | ICD-10-CM | POA: Diagnosis not present

## 2015-03-23 DIAGNOSIS — E860 Dehydration: Secondary | ICD-10-CM | POA: Diagnosis not present

## 2015-03-23 DIAGNOSIS — I872 Venous insufficiency (chronic) (peripheral): Secondary | ICD-10-CM | POA: Diagnosis not present

## 2015-03-23 LAB — GLUCOSE, CAPILLARY: GLUCOSE-CAPILLARY: 107 mg/dL — AB (ref 65–99)

## 2015-03-23 NOTE — Progress Notes (Signed)
Orders received for pt discharge.  Discharge summary printed and reviewed with pt.  Explained medication regimen, and pt had no further questions at this time.  IV removed and site remains clean, dry, intact.  Telemetry removed.  Pt in stable condition and awaiting transport. 

## 2015-03-23 NOTE — Discharge Instructions (Signed)
You were admitted for a near syncopal episode.  You had an echocardiogram that showed NO systolic dysfunction with an estimated ejection fraction 60-65%.  There were no Mitral valve abnormalities on this study.  You should STOP taking Lasix.  Purchase compression hose as planned.  Continue to use your cane/walker at home.  Also, should you have the need to use your Nitroglycerin tablet at home, PLEASE MAKE SURE TO STAY SEATED after taking medication.  This medication can cause decrease in your blood pressure and make you more prone to falls.  You should schedule an appointment with your primary doctor for a visit in 1 week.  Near-Syncope Near-syncope (commonly known as near fainting) is sudden weakness, dizziness, or feeling like you might pass out. This can happen when getting up or while standing for a long time. It is caused by a sudden decrease in blood flow to the brain, which can occur for various reasons. Most of the reasons are not serious.  HOME CARE Watch your condition for any changes.  Have someone stay with you until you feel stable.  If you feel like you are going to pass out:  Lie down right away.  Prop your feet up if you can.  Breathe deeply and steadily.  Move only when the feeling has gone away. Most of the time, this feeling lasts only a few minutes. You may feel tired for several hours.  Drink enough fluids to keep your pee (urine) clear or pale yellow.  If you are taking blood pressure or heart medicine, stand up slowly.  Follow up with your doctor as told. GET HELP RIGHT AWAY IF:   You have a severe headache.  You have unusual pain in the chest, belly (abdomen), or back.  You have bleeding from the mouth or butt (rectum), or you have black or tarry poop (stool).  You feel your heart beat differently than normal, or you have a very fast pulse.  You pass out, or you twitch and shake when you pass out.  You pass out when sitting or lying down.  You feel  confused.  You have trouble walking.  You are weak.  You have vision problems. MAKE SURE YOU:   Understand these instructions.  Will watch your condition.  Will get help right away if you are not doing well or get worse. Document Released: 03/06/2008 Document Revised: 09/23/2013 Document Reviewed: 02/21/2013 [facility] Patient Information 2015 [place], [place]. This information is not intended to replace advice given to you by your health care provider. Make sure you discuss any questions you have with your health care provider. Fall Prevention and Home Safety Falls cause injuries and can affect all age groups. It is possible to prevent falls.  HOW TO PREVENT FALLS  Wear shoes with rubber soles that do not have an opening for your toes.  Keep the inside and outside of your house well lit.  Use night lights throughout your home.  Remove clutter from floors.  Clean up floor spills.  Remove throw rugs or fasten them to the floor with carpet tape.  Do not place electrical cords across pathways.  Put grab bars by your tub, shower, and toilet. Do not use towel bars as grab bars.  Put handrails on both sides of the stairway. Fix loose handrails.  Do not climb on stools or stepladders, if possible.  Do not wax your floors.  Repair uneven or unsafe sidewalks, walkways, or stairs.  Keep items you use a lot within  reach.  Be aware of pets.  Keep emergency numbers next to the telephone.  Put smoke detectors in your home and near bedrooms. Ask your doctor what other things you can do to prevent falls. Document Released: 07/15/2009 Document Revised: 03/19/2012 Document Reviewed: 12/19/2011 [facility] Patient Information 2015 [place], [place]. This information is not intended to replace advice given to you by your health care provider. Make sure you discuss any questions you have with your health care provider.

## 2015-03-23 NOTE — Progress Notes (Signed)
Family Medicine Teaching Service Daily Progress Note Intern Pager: [phone]  Patient name: Lauren Moreno Medical record number: [id number] Date of birth: 02-08-1928 Age: 81 y.o. Gender: female  Primary Care Provider: Leonides Sake, MD Consultants: none Code Status: DNI  Pt Overview and Major Events to Date:  06/19: Admitted  Assessment and Plan: Lauren Moreno is a 81 y.o. female presenting with a fall due to near syncope. PMH is significant for HTN  Near syncope: Likely orthostatic etiology in setting of recently starting lasix for LE edema. Also dehydrated on exam and has been prone to falling. Not vertiginous. CT head without infarct or bleed. Glucose 107, no oral hypoglycemics or other psychotropics. No lumbar fractures/spondylolisthesis. ECHO: 10/07/2010 showed: EF 55-60% G1DD, no wall motion abnormalities, PE pressure 51mmHg.  Repeat Echo: Grade 1 diastolic dysfnx with EF 29-56% - orthostatic VS's positive with 3 minutes of standing - Discontinue lasix.  Should not continue this on discharge since no systolic dysfunction found on echo. - PT/OT evaluation for generalized weakness. Uses cane and walker at home. NO PT f/u recommended. - Tylenol prn pain.  - I/O, daily weights: -1.9L since admission; weight pending this am - ESR slightly elevated 26.  PMR or Giant cell arteritis as a possible DDx for dizziness; however unlikely at such a small SED rate elevation.  History of angina: Not currently experiencing chest pain. Troponins neg.  ECG nonischemic. Will reorder home medications and prns.  - Aspirin $RemoveB'81mg'BNXveidi$ , oxygen prn (not hypoxemic), morphine and nitro prn chest pain. Oxygen prn.  Venous insufficiency: Most likely cause of LE edema as BNP is, Cr is normal, and no signs or symptoms of hepatic problems.  - TED hose   Hypothyroidism: History of TSH 6.174, treated with synthroid. Was stopped at some point and restarted about 3 weeks ago. In setting of weakness and near  syncope, will check TSH, though HR and other symptoms don't suggest this as a likely culprit.  - TSH 0.201, Recommend repeating this in 3 months. May need to decrease Synthroid dose. - Continue low dose synthroid 60mcg   FEN/GI: SLIV, heart healthy diet Prophylaxis: Lovenox  Disposition: Anticipate discharge today.  Subjective:  Reviewed echo with patient.  She voiced appreciation.  She reports that she was able to get out of bed this morning to urinate without difficulty.  She denies dizziness, CP, SOB, vision changes, weakness.  She reports that she plans on getting some compression stockings from a specialty store once discharged from hospital.  Patient voices no other concerns this morning and feels that she is ready for discharge if possible this afternoon.  Her son should be by around 830 this morning.  Objective: Temp:  [97.8 F (36.6 C)-98.1 F (36.7 C)] 97.8 F (36.6 C) (06/20 2040) Pulse Rate:  [71-80] 80 (06/20 2040) Resp:  [17-18] 18 (06/20 2040) BP: (127-145)/(45-59) 127/45 mmHg (06/20 2040) SpO2:  [99 %] 99 % (06/20 2040) Weight:  [171 lb 9.6 oz (77.837 kg)] 171 lb 9.6 oz (77.837 kg) (06/20 0735) Physical Exam: General: asleep in bed, NAD Cardiovascular: RRR, no murmurs, +2 DP Respiratory: CTAB, no wheeze, no increased WOB Abdomen: soft, NT/ND, +BS Extremities: WWP, trace pedal edema, +generalized TTP to LE (apparently chronic per patient) Neuro: follows commands, no focal deficits Psych: pleasant, mood stable  Laboratory:  Recent Labs Lab 03/21/15 1601 03/22/15 0442  WBC 8.1 7.4  HGB 14.6 13.9  HCT 43.6 42.1  PLT 230 218    Recent Labs Lab 03/21/15 1601 03/22/15  0442  NA 141 141  K 4.0 3.8  CL 105 108  CO2 27 27  BUN 13 11  CREATININE 0.61 0.47  CALCIUM 9.7 8.9  PROT  --  5.8*  BILITOT  --  0.5  ALKPHOS  --  65  ALT  --  11*  AST  --  18  GLUCOSE 107* 100*   Imaging/Diagnostic Tests: 2D Echo: Study Conclusions - Left ventricle: The  cavity size was normal. Wall thickness was normal. Systolic function was normal. The estimated ejection fraction was in the range of 60% to 65%. Wall motion was normal; there were no regional wall motion abnormalities. Doppler parameters are consistent with abnormal left ventricular relaxation (grade 1 diastolic dysfunction).  Janora Norlander, DO 03/23/2015, 6:25 AM PGY-1, [place] Intern pager: [phone], text pages welcome

## 2015-03-25 LAB — VITAMIN D 1,25 DIHYDROXY
VITAMIN D3 1, 25 (OH): 59 pg/mL
Vitamin D 1, 25 (OH)2 Total: 59 pg/mL

## 2015-03-26 DIAGNOSIS — E039 Hypothyroidism, unspecified: Secondary | ICD-10-CM | POA: Diagnosis not present

## 2015-03-26 DIAGNOSIS — R600 Localized edema: Secondary | ICD-10-CM | POA: Diagnosis not present

## 2015-03-26 DIAGNOSIS — Z6831 Body mass index (BMI) 31.0-31.9, adult: Secondary | ICD-10-CM | POA: Diagnosis not present

## 2015-05-21 DIAGNOSIS — E039 Hypothyroidism, unspecified: Secondary | ICD-10-CM | POA: Diagnosis not present

## 2015-09-17 DIAGNOSIS — Z961 Presence of intraocular lens: Secondary | ICD-10-CM | POA: Diagnosis not present

## 2015-09-21 ENCOUNTER — Telehealth: Payer: Self-pay | Admitting: Cardiovascular Disease

## 2015-09-21 DIAGNOSIS — R42 Dizziness and giddiness: Secondary | ICD-10-CM | POA: Diagnosis not present

## 2015-09-21 DIAGNOSIS — R002 Palpitations: Secondary | ICD-10-CM | POA: Diagnosis not present

## 2015-09-21 DIAGNOSIS — E669 Obesity, unspecified: Secondary | ICD-10-CM | POA: Diagnosis not present

## 2015-09-21 DIAGNOSIS — E039 Hypothyroidism, unspecified: Secondary | ICD-10-CM | POA: Diagnosis not present

## 2015-09-21 DIAGNOSIS — Z9181 History of falling: Secondary | ICD-10-CM | POA: Diagnosis not present

## 2015-09-21 DIAGNOSIS — Z6832 Body mass index (BMI) 32.0-32.9, adult: Secondary | ICD-10-CM | POA: Diagnosis not present

## 2015-09-21 NOTE — Telephone Encounter (Signed)
[place] faxed records over for 10/19/15 appointment with Dr.Nishan placed in chart prep bin.

## 2015-09-23 ENCOUNTER — Encounter: Payer: Self-pay | Admitting: Cardiovascular Disease

## 2015-10-18 NOTE — Progress Notes (Signed)
Patient ID: Lauren Moreno, female   DOB: Apr 21, 1928, 82 y.o.   MRN: [id number]     Cardiology Office Note   Date:  10/19/2015   ID:  Lauren Moreno, DOB 12-12-1927, MRN [id number]  PCP:  Leonides Sake, MD  Cardiologist:  Mare Ferrari  Chief Complaint  Patient presents with  . Dizziness    some dizziness & swelling today  . Palpitations      History of Present Illness: Lauren Moreno is a 82 y.o. female who presents for  Evaluation weakness presyncope Hospitalized June 2016 with negative cardiac w/u  Echo reviewed and normal EF 60-65% R/O.  Hydrated and lasix stopped. Has LE venous insufficiency and edema History of HTN, elevated lipids and hypothyroidism on replacement  Telemetry no arrhythmias.  Thought to be orthostatic and vascular depletion as lasix started in June for edema.  Patient stopped her synthroid on her own 2 weeks ago.  Has atypical muscular chest and back pain Chronic dizzyness has seen vestibular PT and neurology before.  No history of CAD No history of arrhythmia  Has palpitations only at night None during day  Lab Results  Component Value Date   TSH 0.201* 03/21/2015       Past Medical History  Diagnosis Date  . Hyperlipidemia   . Hypertension   . Thyroid disease     Past Surgical History  Procedure Laterality Date  . Appendectomy    . Partial hysterectomy    . Hemorroidectomy    . Tonsillectomy and adenoidectomy    . Fracture surgery    . Orif tibia & fibula fractures      right leg  . Left arm fracture    . Shoulder arthroscopy      left     Current Outpatient Prescriptions  Medication Sig Dispense Refill  . acetaminophen (TYLENOL) 500 MG tablet Take 1,000 mg by mouth every 6 (six) hours as needed for moderate pain.    Marland Kitchen aspirin EC 81 MG tablet Take 81 mg by mouth daily as needed for mild pain or moderate pain.     . cetirizine (ZYRTEC) 10 MG tablet Take 10 mg by mouth as needed for allergies.    . Multiple Vitamin (MULTIVITAMIN  WITH MINERALS) TABS tablet Take 1 tablet by mouth daily.    . nitroGLYCERIN (NITROSTAT) 0.4 MG SL tablet Place 0.4 mg under the tongue every 5 (five) minutes as needed for chest pain (3 doses max).      No current facility-administered medications for this visit.    Allergies:   Penicillins and Shrimp    Social History:  The patient  reports that she has never smoked. She does not have any smokeless tobacco history on file. She reports that she does not drink alcohol or use illicit drugs.   Family History:  No premature CAD in mother or father  No history of cardiomyopathy    ROS:  Please see the history of present illness.   Otherwise, review of systems are positive for none.   All other systems are reviewed and negative.    PHYSICAL EXAM: VS:  There were no vitals taken for this visit. , BMI There is no weight on file to calculate BMI. Affect appropriate Healthy:  appears stated age 16: normal Neck supple with no adenopathy JVP normal no bruits no thyromegaly Lungs clear with no wheezing and good diaphragmatic motion Heart:  S1/S2 no murmur, no rub, gallop or click PMI normal Abdomen: benighn, BS positve, no  tenderness, no AAA no bruit.  No HSM or HJR Distal pulses intact with no bruits No edema Neuro non-focal Skin warm and dry No muscular weakness    EKG:   03/23/15  SR rate 70 LVH LAD    Recent Labs: 03/21/2015: B Natriuretic Peptide 34.4; TSH 0.201* 03/22/2015: ALT 11*; BUN 11; Creatinine, Ser 0.47; Hemoglobin 13.9; Platelets 218; Potassium 3.8; Sodium 141    Lipid Panel    Component Value Date/Time   CHOL  10/07/2010 0550    143        ATP III CLASSIFICATION:  <200     mg/dL   Desirable  200-239  mg/dL   Borderline High  >=240    mg/dL   High          TRIG 78 10/07/2010 0550   HDL 35* 10/07/2010 0550   CHOLHDL 4.1 10/07/2010 0550   VLDL 16 10/07/2010 0550   LDLCALC  10/07/2010 0550    92        Total Cholesterol/HDL:CHD Risk Coronary Heart Disease  Risk Table                     Men   Women  1/2 Average Risk   3.4   3.3  Average Risk       5.0   4.4  2 X Average Risk   9.6   7.1  3 X Average Risk  23.4   11.0        Use the calculated Patient Ratio above and the CHD Risk Table to determine the patient's CHD Risk.        ATP III CLASSIFICATION (LDL):  <100     mg/dL   Optimal  100-129  mg/dL   Near or Above                    Optimal  130-159  mg/dL   Borderline  160-189  mg/dL   High  >190     mg/dL   Very High      Wt Readings from Last 3 Encounters:  03/23/15 77.656 kg (171 lb 3.2 oz)      Other studies Reviewed: Additional studies/ records that were reviewed today include: Epic notes Hospitalization June ECG;s monitor labs Primary note Dr Lisbeth Ply .    ASSESSMENT AND PLAN:  1. Syncope non cardiac related to over diuresis no need for further testing 2. Edema Chronic LE venous PRN diuretics only Cannot wear compression stocking too tight low sodium diet 3. Thyroid needs f/u TSH with primary since she stopped her synthroid 4. Chest pain atypical no need for stress testing given age and r/o in June 5 Palpitaitons benign ? Related to anxiety can consider monitor if they occur with exertion or during day   Current medicines are reviewed at length with the patient today.  The patient does not have concerns regarding medicines.  The following changes have been made:  no change  Labs/ tests ordered today include:    No orders of the defined types were placed in this encounter.     Disposition:   FU with [place] PRN     Signed, Jenkins Rouge, MD  10/19/2015 8:52 AM    [facility] [address], [place], [place]  [address] Phone: [phone]; Fax: [phone]

## 2015-10-19 ENCOUNTER — Encounter: Payer: Self-pay | Admitting: Cardiovascular Disease

## 2015-10-19 ENCOUNTER — Ambulatory Visit (INDEPENDENT_AMBULATORY_CARE_PROVIDER_SITE_OTHER): Payer: Medicare Other | Admitting: Cardiovascular Disease

## 2015-10-19 VITALS — BP 130/100 | HR 83 | Ht 60.0 in | Wt 179.4 lb

## 2015-10-19 DIAGNOSIS — R002 Palpitations: Secondary | ICD-10-CM | POA: Diagnosis not present

## 2015-10-19 DIAGNOSIS — R42 Dizziness and giddiness: Secondary | ICD-10-CM

## 2015-10-19 NOTE — Patient Instructions (Addendum)
Medication Instructions:  Your physician recommends that you continue on your current medications as directed. Please refer to the Current Medication list given to you today.  Labwork: NONE  Testing/Procedures: NONE  Follow-Up: Your physician wants you to follow-up as needed.  If you need a refill on your cardiac medications before your next appointment, please call your pharmacy.   

## 2015-11-18 DIAGNOSIS — Z1389 Encounter for screening for other disorder: Secondary | ICD-10-CM | POA: Diagnosis not present

## 2015-11-18 DIAGNOSIS — Z6832 Body mass index (BMI) 32.0-32.9, adult: Secondary | ICD-10-CM | POA: Diagnosis not present

## 2015-11-18 DIAGNOSIS — E039 Hypothyroidism, unspecified: Secondary | ICD-10-CM | POA: Diagnosis not present

## 2015-11-18 DIAGNOSIS — R600 Localized edema: Secondary | ICD-10-CM | POA: Diagnosis not present

## 2015-11-18 DIAGNOSIS — R202 Paresthesia of skin: Secondary | ICD-10-CM | POA: Diagnosis not present

## 2016-03-27 DIAGNOSIS — S80861A Insect bite (nonvenomous), right lower leg, initial encounter: Secondary | ICD-10-CM | POA: Diagnosis not present

## 2016-03-27 DIAGNOSIS — Z6832 Body mass index (BMI) 32.0-32.9, adult: Secondary | ICD-10-CM | POA: Diagnosis not present

## 2016-03-27 DIAGNOSIS — R600 Localized edema: Secondary | ICD-10-CM | POA: Diagnosis not present

## 2016-03-27 DIAGNOSIS — E039 Hypothyroidism, unspecified: Secondary | ICD-10-CM | POA: Diagnosis not present

## 2016-03-27 DIAGNOSIS — R002 Palpitations: Secondary | ICD-10-CM | POA: Diagnosis not present

## 2016-05-17 DIAGNOSIS — Z6832 Body mass index (BMI) 32.0-32.9, adult: Secondary | ICD-10-CM | POA: Diagnosis not present

## 2016-05-17 DIAGNOSIS — R300 Dysuria: Secondary | ICD-10-CM | POA: Diagnosis not present

## 2016-05-17 DIAGNOSIS — L739 Follicular disorder, unspecified: Secondary | ICD-10-CM | POA: Diagnosis not present

## 2016-05-17 DIAGNOSIS — I499 Cardiac arrhythmia, unspecified: Secondary | ICD-10-CM | POA: Diagnosis not present

## 2016-05-17 DIAGNOSIS — R21 Rash and other nonspecific skin eruption: Secondary | ICD-10-CM | POA: Diagnosis not present

## 2016-05-21 IMAGING — DX DG CHEST 2V
2 series · 2 of 2 positions shown · non-contrast
Comparison: December 18, 2012

CLINICAL DATA: Pain following fall.  Dizziness.

EXAM:
CHEST  2 VIEW

[chest lat]
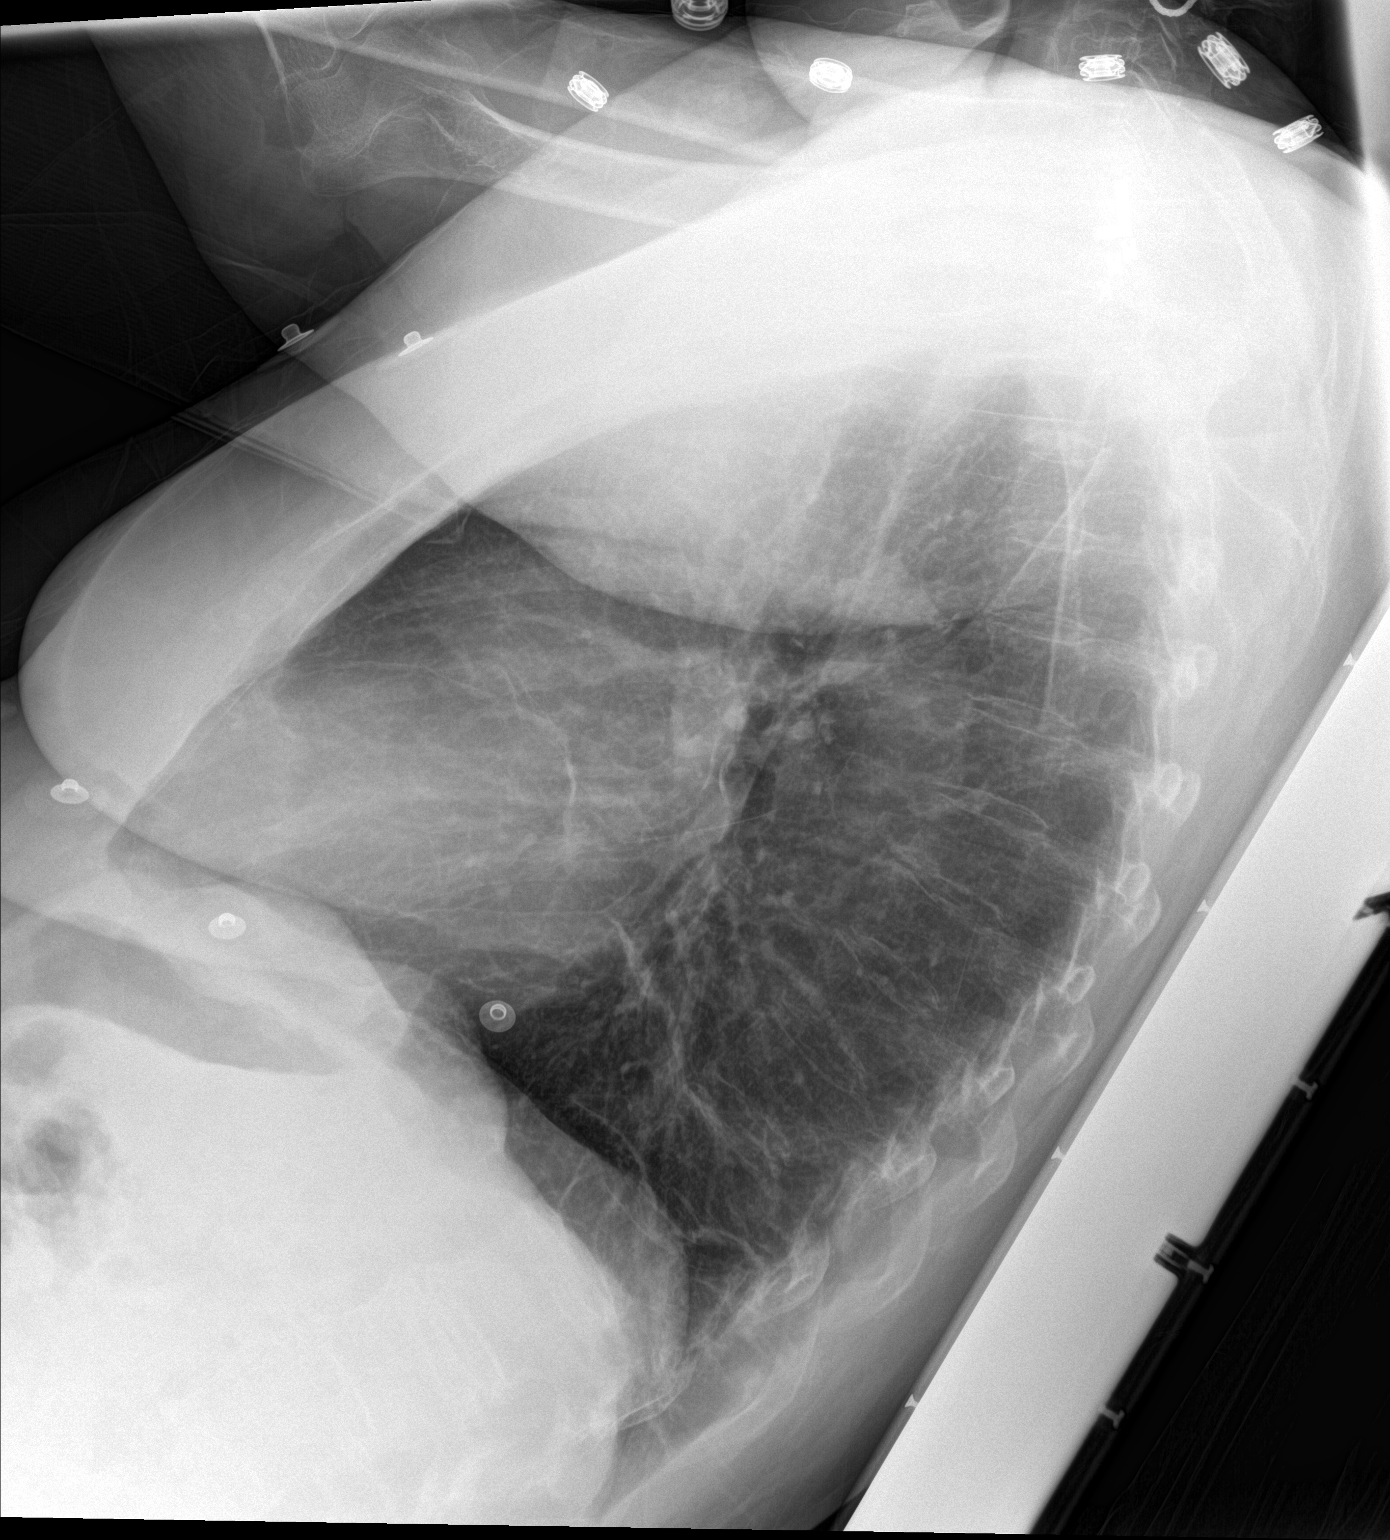

[chest ap]
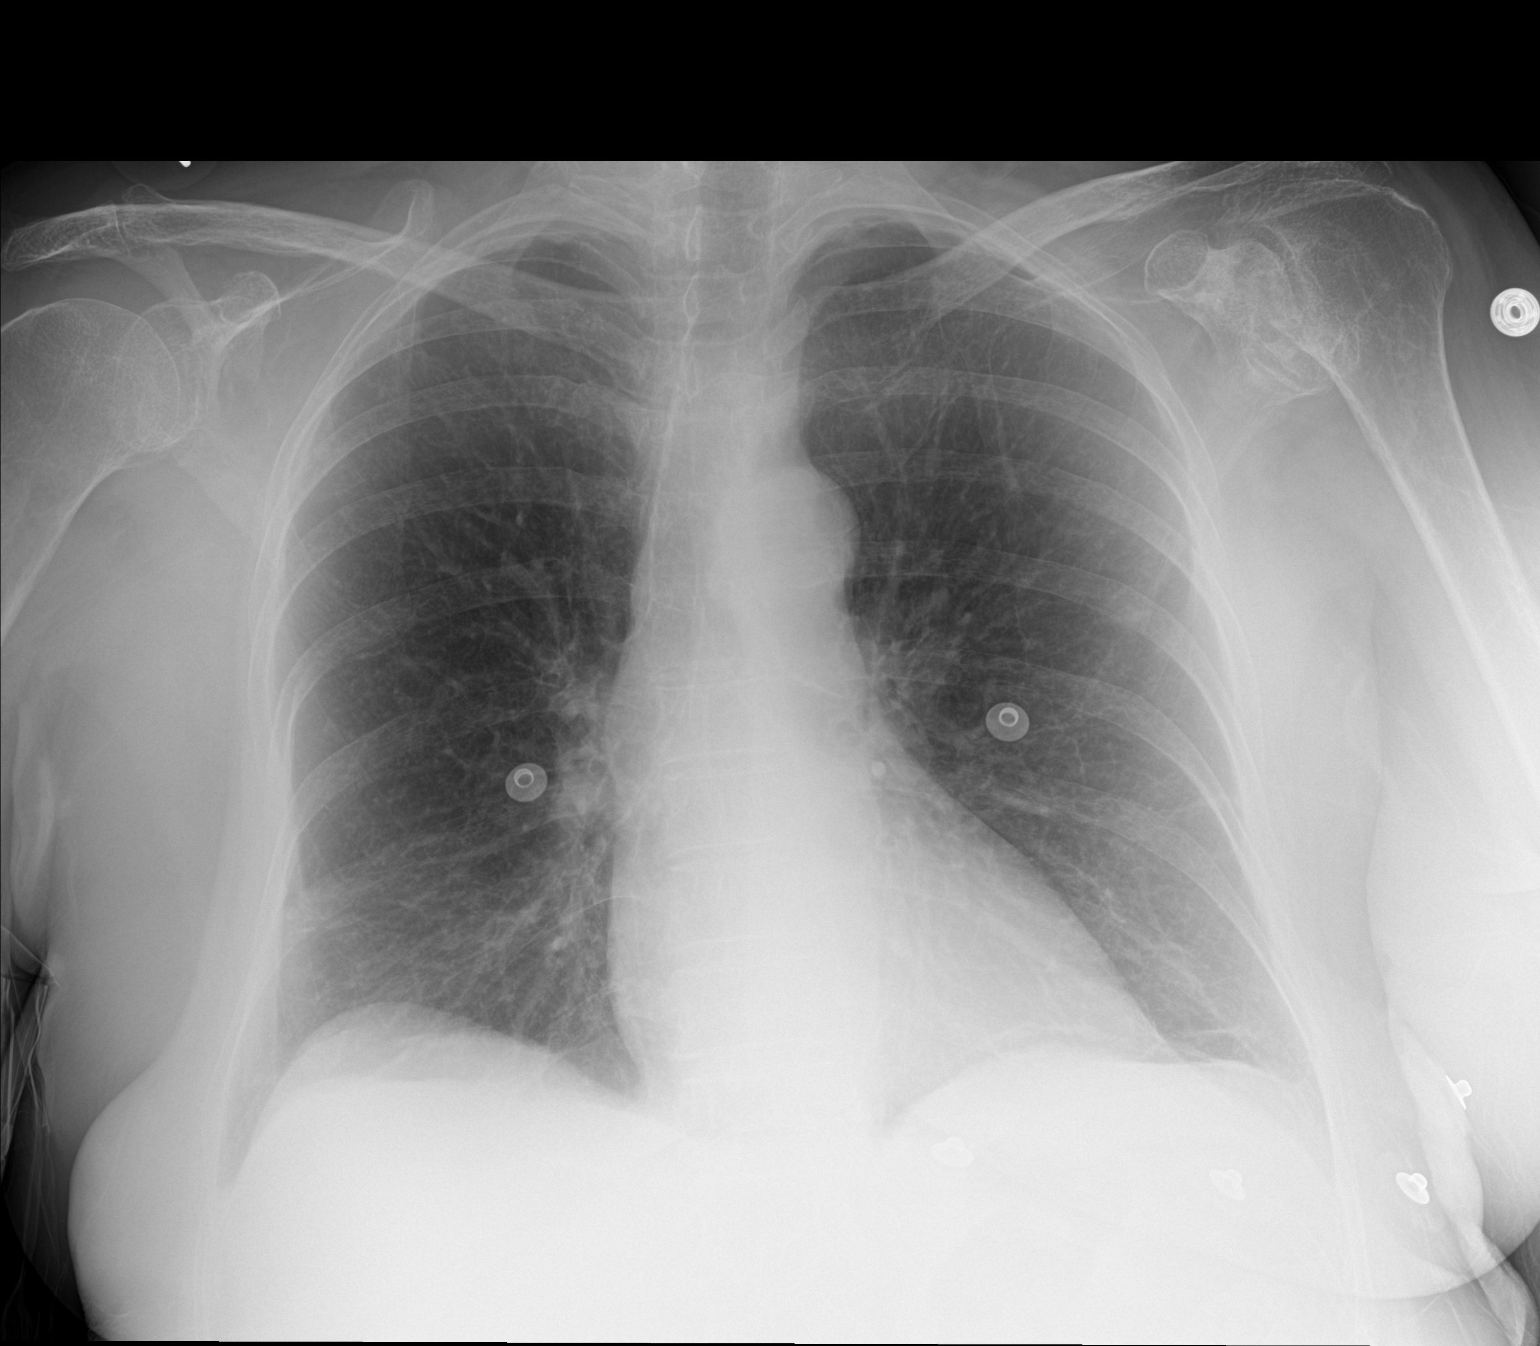

[2 of 2 positions shown; findings below may reference images not displayed]

FINDINGS: There is minimal scarring in the left base. There is no edema or
consolidation. The heart size and pulmonary vascularity are normal.
No adenopathy. No pneumothorax. There is chronic arthropathy in the
left shoulder region. Anterior wedging of a lower thoracic vertebral
body is stable.
IMPRESSION: No edema or consolidation.  Stable slight scarring left base.

## 2017-02-07 DIAGNOSIS — S0093XA Contusion of unspecified part of head, initial encounter: Secondary | ICD-10-CM | POA: Diagnosis not present

## 2017-02-07 DIAGNOSIS — S0990XA Unspecified injury of head, initial encounter: Secondary | ICD-10-CM | POA: Diagnosis not present

## 2017-02-09 DIAGNOSIS — R600 Localized edema: Secondary | ICD-10-CM | POA: Diagnosis not present

## 2017-02-09 DIAGNOSIS — R002 Palpitations: Secondary | ICD-10-CM | POA: Diagnosis not present

## 2017-02-09 DIAGNOSIS — M810 Age-related osteoporosis without current pathological fracture: Secondary | ICD-10-CM | POA: Diagnosis not present

## 2017-02-09 DIAGNOSIS — Z9181 History of falling: Secondary | ICD-10-CM | POA: Diagnosis not present

## 2017-02-09 DIAGNOSIS — R42 Dizziness and giddiness: Secondary | ICD-10-CM | POA: Diagnosis not present

## 2017-02-09 DIAGNOSIS — Z6832 Body mass index (BMI) 32.0-32.9, adult: Secondary | ICD-10-CM | POA: Diagnosis not present

## 2017-02-09 DIAGNOSIS — E039 Hypothyroidism, unspecified: Secondary | ICD-10-CM | POA: Diagnosis not present

## 2017-02-16 DIAGNOSIS — Z961 Presence of intraocular lens: Secondary | ICD-10-CM | POA: Diagnosis not present

## 2017-02-16 DIAGNOSIS — H5211 Myopia, right eye: Secondary | ICD-10-CM | POA: Diagnosis not present

## 2017-02-16 DIAGNOSIS — H353131 Nonexudative age-related macular degeneration, bilateral, early dry stage: Secondary | ICD-10-CM | POA: Diagnosis not present

## 2017-02-16 DIAGNOSIS — H5202 Hypermetropia, left eye: Secondary | ICD-10-CM | POA: Diagnosis not present

## 2017-02-21 DIAGNOSIS — W57XXXA Bitten or stung by nonvenomous insect and other nonvenomous arthropods, initial encounter: Secondary | ICD-10-CM | POA: Diagnosis not present

## 2017-02-21 DIAGNOSIS — R600 Localized edema: Secondary | ICD-10-CM | POA: Diagnosis not present

## 2017-02-21 DIAGNOSIS — E669 Obesity, unspecified: Secondary | ICD-10-CM | POA: Diagnosis not present

## 2017-02-21 DIAGNOSIS — M791 Myalgia: Secondary | ICD-10-CM | POA: Diagnosis not present

## 2017-02-21 DIAGNOSIS — Z6832 Body mass index (BMI) 32.0-32.9, adult: Secondary | ICD-10-CM | POA: Diagnosis not present

## 2017-02-21 DIAGNOSIS — R51 Headache: Secondary | ICD-10-CM | POA: Diagnosis not present

## 2017-02-21 DIAGNOSIS — L989 Disorder of the skin and subcutaneous tissue, unspecified: Secondary | ICD-10-CM | POA: Diagnosis not present

## 2018-03-21 DIAGNOSIS — Z1331 Encounter for screening for depression: Secondary | ICD-10-CM | POA: Diagnosis not present

## 2018-03-21 DIAGNOSIS — Z6832 Body mass index (BMI) 32.0-32.9, adult: Secondary | ICD-10-CM | POA: Diagnosis not present

## 2018-03-21 DIAGNOSIS — N959 Unspecified menopausal and perimenopausal disorder: Secondary | ICD-10-CM | POA: Diagnosis not present

## 2018-03-21 DIAGNOSIS — Z0000 Encounter for general adult medical examination without abnormal findings: Secondary | ICD-10-CM | POA: Diagnosis not present

## 2018-03-21 DIAGNOSIS — Z9181 History of falling: Secondary | ICD-10-CM | POA: Diagnosis not present

## 2018-03-21 DIAGNOSIS — Z136 Encounter for screening for cardiovascular disorders: Secondary | ICD-10-CM | POA: Diagnosis not present

## 2018-03-21 DIAGNOSIS — E785 Hyperlipidemia, unspecified: Secondary | ICD-10-CM | POA: Diagnosis not present

## 2018-03-21 DIAGNOSIS — R600 Localized edema: Secondary | ICD-10-CM | POA: Diagnosis not present

## 2018-03-21 DIAGNOSIS — E039 Hypothyroidism, unspecified: Secondary | ICD-10-CM | POA: Diagnosis not present

## 2018-03-21 DIAGNOSIS — E669 Obesity, unspecified: Secondary | ICD-10-CM | POA: Diagnosis not present

## 2018-03-21 DIAGNOSIS — Z1339 Encounter for screening examination for other mental health and behavioral disorders: Secondary | ICD-10-CM | POA: Diagnosis not present

## 2018-03-21 DIAGNOSIS — R2681 Unsteadiness on feet: Secondary | ICD-10-CM | POA: Diagnosis not present

## 2018-07-08 DIAGNOSIS — Z961 Presence of intraocular lens: Secondary | ICD-10-CM | POA: Diagnosis not present

## 2018-07-08 DIAGNOSIS — H353130 Nonexudative age-related macular degeneration, bilateral, stage unspecified: Secondary | ICD-10-CM | POA: Diagnosis not present

## 2018-07-08 DIAGNOSIS — H524 Presbyopia: Secondary | ICD-10-CM | POA: Diagnosis not present

## 2019-04-08 ENCOUNTER — Emergency Department (HOSPITAL_COMMUNITY)
Admission: EM | Admit: 2019-04-08 | Discharge: 2019-04-08 | Disposition: A | Discharge status: Home / Self Care | Payer: Medicare Other | Attending: Emergency Medicine | Admitting: Emergency Medicine

## 2019-04-08 ENCOUNTER — Emergency Department (HOSPITAL_COMMUNITY): Payer: Medicare Other

## 2019-04-08 ENCOUNTER — Other Ambulatory Visit: Payer: Self-pay

## 2019-04-08 DIAGNOSIS — H538 Other visual disturbances: Secondary | ICD-10-CM

## 2019-04-08 DIAGNOSIS — Z5320 Procedure and treatment not carried out because of patient's decision for unspecified reasons: Secondary | ICD-10-CM | POA: Insufficient documentation

## 2019-04-08 DIAGNOSIS — Z79899 Other long term (current) drug therapy: Secondary | ICD-10-CM | POA: Insufficient documentation

## 2019-04-08 DIAGNOSIS — Z7982 Long term (current) use of aspirin: Secondary | ICD-10-CM | POA: Diagnosis not present

## 2019-04-08 DIAGNOSIS — R001 Bradycardia, unspecified: Secondary | ICD-10-CM | POA: Diagnosis not present

## 2019-04-08 DIAGNOSIS — I6622 Occlusion and stenosis of left posterior cerebral artery: Secondary | ICD-10-CM | POA: Insufficient documentation

## 2019-04-08 DIAGNOSIS — R0602 Shortness of breath: Secondary | ICD-10-CM | POA: Diagnosis not present

## 2019-04-08 DIAGNOSIS — R9431 Abnormal electrocardiogram [ECG] [EKG]: Secondary | ICD-10-CM | POA: Diagnosis not present

## 2019-04-08 DIAGNOSIS — E039 Hypothyroidism, unspecified: Secondary | ICD-10-CM | POA: Diagnosis not present

## 2019-04-08 DIAGNOSIS — I10 Essential (primary) hypertension: Secondary | ICD-10-CM | POA: Insufficient documentation

## 2019-04-08 DIAGNOSIS — R079 Chest pain, unspecified: Secondary | ICD-10-CM | POA: Diagnosis not present

## 2019-04-08 DIAGNOSIS — R42 Dizziness and giddiness: Secondary | ICD-10-CM | POA: Diagnosis not present

## 2019-04-08 LAB — CBC
HCT: 46.9 % — ABNORMAL HIGH (ref 36.0–46.0)
Hemoglobin: 15.4 g/dL — ABNORMAL HIGH (ref 12.0–15.0)
MCH: 31.2 pg (ref 26.0–34.0)
MCHC: 32.8 g/dL (ref 30.0–36.0)
MCV: 94.9 fL (ref 80.0–100.0)
Platelets: 191 10*3/uL (ref 150–400)
RBC: 4.94 MIL/uL (ref 3.87–5.11)
RDW: 13.2 % (ref 11.5–15.5)
WBC: 7.3 10*3/uL (ref 4.0–10.5)
nRBC: 0 % (ref 0.0–0.2)

## 2019-04-08 LAB — BASIC METABOLIC PANEL
Anion gap: 9 (ref 5–15)
BUN: 14 mg/dL (ref 8–23)
CO2: 26 mmol/L (ref 22–32)
Calcium: 9.6 mg/dL (ref 8.9–10.3)
Chloride: 104 mmol/L (ref 98–111)
Creatinine, Ser: 0.7 mg/dL (ref 0.44–1.00)
GFR calc Af Amer: >60 mL/min (ref >60)
GFR calc non Af Amer: >60 mL/min (ref >60)
Glucose, Bld: 91 mg/dL (ref 70–99)
Potassium: 4.6 mmol/L (ref 3.5–5.1)
Sodium: 139 mmol/L (ref 135–145)

## 2019-04-08 LAB — TROPONIN I (HIGH SENSITIVITY)
Troponin I (High Sensitivity): <2 ng/L (ref <18)
Troponin I (High Sensitivity): 3 ng/L (ref <18)

## 2019-04-08 LAB — PROTIME-INR
INR: 1.1 (ref 0.8–1.2)
Prothrombin Time: 13.7 seconds (ref 11.4–15.2)

## 2019-04-08 MED ORDER — CLOPIDOGREL BISULFATE 300 MG PO TABS
300.0000 mg | ORAL_TABLET | Freq: Once | ORAL | Status: AC
Start: 2019-04-08 — End: 2019-04-08
  Administered 2019-04-08: 19:00:00 300 mg via ORAL
  Filled 2019-04-08: qty 1

## 2019-04-08 MED ORDER — CLOPIDOGREL BISULFATE 75 MG PO TABS: 75.0000 mg | ORAL_TABLET | Freq: Every day | ORAL | 0 refills | Status: DC

## 2019-04-08 MED ORDER — ASPIRIN 81 MG PO CHEW
81.0000 mg | CHEWABLE_TABLET | Freq: Once | ORAL | Status: AC
  Administered 2019-04-08: 81 mg via ORAL
  Filled 2019-04-08: qty 1

## 2019-04-08 MED ORDER — SODIUM CHLORIDE 0.9% FLUSH: 3.0000 mL | Freq: Once | INTRAVENOUS | Status: DC

## 2019-04-08 NOTE — ED Notes (Signed)
Patient transported to MRI 

## 2019-04-08 NOTE — ED Provider Notes (Signed)
Patient seen after signout from prior ED providers.  MRI results discussed with both the patient and with Dr. Leonel Ramsay of neurology.  Given patient's described symptoms it is possible that the patient had a transient ischemic attack approximately 2 weeks previously.  She does not meet criteria for inpatient treatment or work-up per Dr. Leonel Ramsay.  Dr. Leonel Ramsay does recommend 7 days of Plavix followed by continued daily baby aspirin's (81mg ).  Outpatient work-up is recommended to the patient.  Patient appears to be appropriate for discharge.  She understands the need for close follow-up.  Strict return precautions given and understood.   Valarie Merino, MD 04/08/19 [phone]

## 2019-04-08 NOTE — ED Provider Notes (Cosign Needed)
[facility] EMERGENCY DEPARTMENT Provider Note   CSN: [id number] Arrival date & time: 04/08/19  1224    History   Chief Complaint Chief Complaint  Patient presents with  . Bradycardia    HPI Lauren Moreno is a 85 y.o. female.     Patient states she was asked to see the doctor today by her children because of 2 weeks of blurry vision, increased difficulty with ambulation, and weakness.  Patient states that 2 weeks ago she got double vision which eventually resolved but she continues to have blurry vision.  Patient went to the urgent care today she says the doctor told her she needs to go to the hospital because her heart rate was irregular.  In the past 2 weeks the patient says her blurry vision has continued, she has had more difficulty ambulating, and she thinks her left side is weaker than her right.  At baseline she uses a walker at home but says that she has felt more unsteady over the last few weeks.  She also states she feels dizzy, and cannot describe this in greater detail only saying that she does not patient states that she has a history of "mini strokes" and says that she was told last time she was in the hospital she had several strokes previously, although she does not know when this was or why they suspect she had previous stroke.     Past Medical History:  Diagnosis Date  . Hyperlipidemia   . Hypertension   . Thyroid disease     Patient Active Problem List   Diagnosis Date Noted  . Weakness generalized   . Chronic venous insufficiency   . Fall   . Dehydration   . Near syncope 03/21/2015    Past Surgical History:  Procedure Laterality Date  . APPENDECTOMY    . FRACTURE SURGERY    . HEMORROIDECTOMY    . left arm fracture    . ORIF TIBIA & FIBULA FRACTURES     right leg  . PARTIAL HYSTERECTOMY    . SHOULDER ARTHROSCOPY     left  . TONSILLECTOMY AND ADENOIDECTOMY       OB History   No obstetric history on file.      Home  Medications    Prior to Admission medications   Medication Sig Start Date End Date Taking? Authorizing Provider  acetaminophen (TYLENOL) 500 MG tablet Take 1,000 mg by mouth every 6 (six) hours as needed for moderate pain.   Yes [provider]  aspirin EC 325 MG tablet Take 325 mg by mouth daily as needed for mild pain or moderate pain.    Yes [provider]  diphenhydrAMINE (BENADRYL) 25 MG tablet Take 25 mg by mouth every 6 (six) hours as needed for allergies.   Yes [provider]  meclizine (ANTIVERT) 25 MG tablet Take 25 mg by mouth 3 (three) times daily as needed for dizziness.   Yes [provider]  Multiple Vitamin (MULTIVITAMIN WITH MINERALS) TABS tablet Take 1 tablet by mouth daily.   Yes [provider]  nitroGLYCERIN (NITROSTAT) 0.4 MG SL tablet Place 0.4 mg under the tongue every 5 (five) minutes as needed for chest pain (3 doses max).    Yes [provider]    Family History Family History  Problem Relation Age of Onset  . Cancer Unknown     Social History Social History   Tobacco Use  . Smoking status: Never Smoker  Substance Use Topics  . Alcohol use: No  . Drug use: No     Allergies   Penicillins and Shrimp [shellfish allergy]   Review of Systems Review of Systems   Physical Exam Updated Vital Signs BP (!) 148/62   Pulse (!) 33   Temp 97.9 F (36.6 C)   Resp 15   Ht 5\' 2"  (1.575 m)   Wt 77.1 kg   SpO2 99%   BMI 31.09 kg/m   Physical Exam   ED Treatments / Results  Labs (all labs ordered are listed, but only abnormal results are displayed) Labs Reviewed  CBC - Abnormal; Notable for the following components:      Result Value   Hemoglobin 15.4 (*)    HCT 46.9 (*)    All other components within normal limits  BASIC METABOLIC PANEL  TROPONIN I (HIGH SENSITIVITY)  PROTIME-INR  TROPONIN I (HIGH SENSITIVITY)    EKG EKG Interpretation  Date/Time:  Tuesday April 08 2019 12:33:19 EDT  Ventricular Rate:  81 PR Interval:  190 QRS Duration: 90 QT Interval:  384 QTC Calculation: 446 R Axis:   -39 Text Interpretation:  Sinus rhythm with Premature supraventricular complexes with frequent Premature ventricular complexes Left axis deviation Septal infarct , age undetermined new PVC's Confirmed by Blanchie Dessert [id number]) on 04/08/2019 12:50:25 PM   Radiology Dg Chest Portable 1 View  Result Date: 04/08/2019 CLINICAL DATA:  Bradycardia. Chest pain, shortness of breath, and dizziness. EXAM: PORTABLE CHEST 1 VIEW COMPARISON:  03/21/2015 FINDINGS: Pacer pads overlie the chest. The cardiomediastinal silhouette is unchanged with normal heart size. Minimal left basilar scarring is again noted. The lungs are otherwise clear. No edema, sizable pleural effusion, pneumothorax is identified. Chronic severe left shoulder arthropathy is again noted. IMPRESSION: No active disease. Electronically Signed   By: Logan Bores M.D.   On: 04/08/2019 13:11    Procedures Procedures (including critical care time)  Medications Ordered in ED Medications  sodium chloride flush (NS) 0.9 % injection 3 mL (has no administration in time range)     Initial Impression / Assessment and Plan / ED Course  I have reviewed the triage vital signs and the nursing notes.  Pertinent labs & imaging results that were available during my care of the patient were reviewed by me and considered in my medical decision making (see chart for details).  Patient is a 85 year old lady with a 2-week history of blurry vision, that was initially diplopia, along with weakness in the left arm and some dizziness/gait changes.  Patient was sent to the ED after going to the urgent care today at the behest of her family.  The physician at the urgent care was concerned about her EKG findings which prompted him to send her here to the ED.  On exam the patient has some mild weakness in the left upper extremity and lower extremity, although at  worst it is 4/5 strength.  Her troponin was negative, CBC was normal, BMP was normal.  After discussion with the PA from neurology he was decided to have patient get a MRI and MRA of brain.  It appears this patient may have had a stroke 2 weeks ago and still has some residual symptoms although appears to be mostly back to baseline.  EKG did show some PACs with a pause in between every 3 beats, but was otherwise normal.  She was not bradycardic.  There are not currently any cardiac issues that are concerning.  If patient is  able to ambulate without difficulty after her MRI, and MRI does not show any acute findings that would necessitate her admission, would be reasonable to discharge the patient to home with close follow-up with her PCP.  I have signed this patient out to the afternoon team.  Final Clinical Impressions(s) / ED Diagnoses   Final diagnoses:  None    ED Discharge Orders    None       Benay Pike, MD 04/08/19 1524

## 2019-04-08 NOTE — ED Triage Notes (Signed)
Pt with need for a repeat ekg, she reports being dizziness. On the monitor pulse ranging from 26-58.

## 2019-04-08 NOTE — Discharge Instructions (Addendum)
Return for any problem.  Follow-up with your regular care providers as instructed.  Follow-up with neurology as an outpatient as instructed.  Your symptoms as described today may represent a possible transient ischemic attack or transient stroke.  The work-up in the ED as performed today did not find any indication for further inpatient treatment.  It is recommended that you take a baby aspirin (81mg ) daily to help prevent a more significant stroke.  It is also recommended you take Plavix for the next 7 days as prescribed.  After the next 7 days you should continue taking a baby aspirin daily (you will no longer need to take Plavix).

## 2019-04-21 DIAGNOSIS — Z9181 History of falling: Secondary | ICD-10-CM | POA: Diagnosis not present

## 2019-04-21 DIAGNOSIS — R001 Bradycardia, unspecified: Secondary | ICD-10-CM | POA: Diagnosis not present

## 2019-04-21 DIAGNOSIS — R42 Dizziness and giddiness: Secondary | ICD-10-CM | POA: Diagnosis not present

## 2019-04-21 DIAGNOSIS — Z6832 Body mass index (BMI) 32.0-32.9, adult: Secondary | ICD-10-CM | POA: Diagnosis not present

## 2019-04-21 DIAGNOSIS — G459 Transient cerebral ischemic attack, unspecified: Secondary | ICD-10-CM | POA: Diagnosis not present

## 2019-04-23 ENCOUNTER — Telehealth: Payer: Self-pay

## 2019-04-23 NOTE — Telephone Encounter (Signed)
NOTES FROM [facility] [phone], REFERRAL SENT TO [facility]

## 2019-04-29 ENCOUNTER — Other Ambulatory Visit: Payer: Self-pay

## 2019-04-29 ENCOUNTER — Encounter: Payer: Self-pay | Admitting: Neurology

## 2019-04-29 ENCOUNTER — Ambulatory Visit (INDEPENDENT_AMBULATORY_CARE_PROVIDER_SITE_OTHER): Payer: Medicare Other | Admitting: Neurology

## 2019-04-29 VITALS — BP 140/82 | HR 80 | Ht 62.0 in | Wt 180.0 lb

## 2019-04-29 DIAGNOSIS — I679 Cerebrovascular disease, unspecified: Secondary | ICD-10-CM

## 2019-04-29 DIAGNOSIS — I499 Cardiac arrhythmia, unspecified: Secondary | ICD-10-CM | POA: Diagnosis not present

## 2019-04-29 DIAGNOSIS — G459 Transient cerebral ischemic attack, unspecified: Secondary | ICD-10-CM

## 2019-04-29 NOTE — Progress Notes (Signed)
Subjective:    Patient ID: Lauren Moreno is a 85 y.o. female.  HPI     Star Age, MD, PhD [facility] [address], [address] [place], [place] [address]  I saw Lauren Moreno, as a referral from the emergency room for TIA concern.  The patient is accompanied by her son today.  She is an 78 year old right-handed woman with an underlying medical history of hypertension, hyperlipidemia, thyroid disease, previous TIA, chronic venous insufficiency, hearing loss, with hearing aids in place, and borderline obesity, who presented to the emergency room on 04/08/2019 with a history of blurry vision for the previous 2 weeks including double vision reported which had resolved by the time she presented to the emergency room.  She had intermittent weakness.  I reviewed the emergency room records.  She was sent to the ER after she presented to urgent care and was noted to have bradycardia and irregularity in her heart rate.  She had a brain MRI without contrast as well as a MRA head without contrast on 04/08/2019 and I reviewed the results: IMPRESSION: 1. No acute intracranial abnormality. 2. Mild chronic small vessel ischemic disease, cerebral atrophy, and ventriculomegaly, unchanged from 2012. 3. Patent anterior and posterior intracranial arterial circulation with a new severe left P1 stenosis.   She reports feeling better.  She has had some left-sided weakness intermittently.  She has knee pain on the right which is not new.  She had a fall with severe injury in 1996, injured her left shoulder and right knee, needed arthroscopic surgery to the left shoulder and had pins placed into the right distal leg.  She has had dizziness off and on for years.  Her son reports that she had work-up for dizziness about 2 years ago.  He believes that her dizziness is better since she has been on baby aspirin.  She feels lightheaded upon standing sometimes.  She lives alone, is widowed  for the past 10 years.  Her other son lives next door.  She has been referred to cardiology.  She has 2 daughters as well.  Everybody checks on her.  She has a call alert button.  She walks with a cane.  She may not always hydrate well, is not sure how much water she drinks.  She has never been told that she snores.  She has had intermittent chest pressure especially at night.  She denies any symptoms of reflux.  Her double vision has resolved.  She has had more fatigue.  She has never had a sleep study.  She had no recent falls. Her Epworth sleepiness score is 9 out of 24 and fatigue severity score is 35 out of 63 today.  Her Past Medical History Is Significant For: Past Medical History:  Diagnosis Date  . Hyperlipidemia   . Hypertension   . Thyroid disease     Her Past Surgical History Is Significant For: Past Surgical History:  Procedure Laterality Date  . APPENDECTOMY    . FRACTURE SURGERY    . HEMORROIDECTOMY    . left arm fracture    . ORIF TIBIA & FIBULA FRACTURES     right leg  . PARTIAL HYSTERECTOMY    . SHOULDER ARTHROSCOPY     left  . TONSILLECTOMY AND ADENOIDECTOMY      Her Family History Is Significant For: Family History  Problem Relation Age of Onset  . Cancer Unknown     Her Social History Is Significant For:  Social History   Socioeconomic History  . Marital status: Married    Spouse name: Not on file  . Number of children: 4  . Years of education: Not on file  . Highest education level: Not on file  Occupational History  . Not on file  Social Needs  . Financial resource strain: Not on file  . Food insecurity    Worry: Not on file    Inability: Not on file  . Transportation needs    Medical: Not on file    Non-medical: Not on file  Tobacco Use  . Smoking status: Never Smoker  . Smokeless tobacco: Never Used  Substance and Sexual Activity  . Alcohol use: No  . Drug use: No  . Sexual activity: Not on file  Lifestyle  . Physical activity    Days  per week: Not on file    Minutes per session: Not on file  . Stress: Not on file  Relationships  . Social Herbalist on phone: Not on file    Gets together: Not on file    Attends religious service: Not on file    Active member of club or organization: Not on file    Attends meetings of clubs or organizations: Not on file    Relationship status: Not on file  Other Topics Concern  . Not on file  Social History Narrative  . Not on file    Her Allergies Are:  Allergies  Allergen Reactions  . Penicillins Anaphylaxis    Did it involve swelling of the face/tongue/throat, SOB, or low BP? Yes Did it involve sudden or severe rash/hives, skin peeling, or any reaction on the inside of your mouth or nose? No Did you need to seek medical attention at a hospital or doctor's office? Yes When did it last happen?yrs and yrs ago If all above answers are "NO", may proceed with cephalosporin use.   Marland Kitchen Shrimp [Shellfish Allergy] Anaphylaxis  :   Her Current Medications Are:  Outpatient Encounter Medications as of 04/29/2019  Medication Sig  . acetaminophen (TYLENOL) 500 MG tablet Take 1,000 mg by mouth every 6 (six) hours as needed for moderate pain.  Marland Kitchen aspirin EC 325 MG tablet Take 325 mg by mouth daily as needed for mild pain or moderate pain.   Marland Kitchen aspirin EC 81 MG tablet Take 81 mg by mouth daily.  . diphenhydrAMINE (BENADRYL) 25 MG tablet Take 25 mg by mouth every 6 (six) hours as needed for allergies.  Marland Kitchen meclizine (ANTIVERT) 25 MG tablet Take 25 mg by mouth 3 (three) times daily as needed for dizziness.  . Multiple Vitamin (MULTIVITAMIN WITH MINERALS) TABS tablet Take 1 tablet by mouth daily.  . nitroGLYCERIN (NITROSTAT) 0.4 MG SL tablet Place 0.4 mg under the tongue every 5 (five) minutes as needed for chest pain (3 doses max).   . [DISCONTINUED] clopidogrel (PLAVIX) 75 MG tablet Take 1 tablet (75 mg total) by mouth daily.   No facility-administered encounter medications on  file as of 04/29/2019.   :   Review of Systems:  Out of a complete 14 point review of systems, all are reviewed and negative with the exception of these symptoms as listed below:  Review of Systems  Neurological:       Pt presents today to discuss her dizziness and stenosis. She was told that she has "stenosis" and needs to see neurology. She is dizzy intermittently.     Objective:  Neurological Exam  Physical Exam Physical Examination:   Vitals:   04/29/19 1435  BP: 140/82  Pulse: 80  On orthostatic testing, she was not able to lie on the examination table for supine blood pressure and pulse, sitting was 144/82 with a pulse of 96, standing was 140/82 with a pulse of 80.  General Examination: The patient is a very pleasant 85 y.o. female in no acute distress. She appears well-developed and well-nourished and well groomed.   HEENT: Normocephalic, atraumatic, pupils are equal, round and reactive to light and accommodation. Extraocular tracking is good without limitation to gaze excursion or nystagmus noted. Normal smooth pursuit is noted. Hearing is impaired. Face is symmetric with normal facial animation and normal facial sensation. Speech is clear with no dysarthria noted. There is no hypophonia. There is no lip, neck/head, jaw or voice tremor. Neck is supple with full range of passive and active motion. There are no carotid bruits on auscultation. Oropharynx exam reveals: moderate mouth dryness, adequate dental hygiene and moderate airway crowding, due to Small airway entry and redundant soft palate.  Tonsils absent.  Mallampati is class III.  Tongue protrudes centrally in palate elevates symmetrically.  Chest: Clear to auscultation without wheezing, rhonchi or crackles noted.  Heart: S1+S2+0, regularly irreg, likely PVCs. No murmurs.   Abdomen: Soft, non-tender and non-distended with normal bowel sounds appreciated on auscultation.  Extremities: There is trace pitting edema in the  distal lower extremities bilaterally. Pedal pulses are intact.  Skin: Warm and dry without trophic changes noted.  Musculoskeletal: exam reveals no obvious joint deformities, tenderness or joint swelling or erythema with the exception of decreased range of motion in the left shoulder knee.   Neurologically:  Mental status: The patient is awake, alert and oriented in all 4 spheres. Her immediate and remote memory, attention, language skills and fund of knowledge are appropriate. There is no evidence of aphasia, agnosia, apraxia or anomia. Speech is clear with normal prosody and enunciation. Thought process is linear. Mood is normal and affect is normal.  Cranial nerves II - XII are as described above under HEENT exam. In addition: shoulder shrug is normal with equal shoulder height noted. Motor exam: Normal bulk, Global strength normal for age, normal tone noted.  She has no resting, postural or action tremor.  Romberg is not tested secondary to safety concerns. Reflexes are 1+ throughout including ankles.  She has preserved fine motor skills in the upper and lower extremities for age.  She has no past-pointing.  Sensory exam is intact to light touch throughout. She stands with mild difficulty, she has to push herself up, she stands slightly wide-based.  She feels somewhat lightheaded initially when she stands up.  She walks cautiously and slowly, using a single-point cane on the right side.   Assessment and Plan:   Assessment and Plan:  In summary, Lauren Moreno is a very pleasant 85 y.o.-year old female with an underlying medical history of hypertension, hyperlipidemia, thyroid disease, previous TIA, chronic venous insufficiency, hearing loss, with hearing aids in place, and borderline obesity, who presented to the emergency room on 04/08/2019 with a history of blurry vision for the previous 2 weeks including double vision reported which had resolved by the time she presented to the emergency  room.  She had intermittent weakness.  She certainly has TIA risk factors.  MRA head in the emergency room did show severe left P1 stenosis but she is not interested in pursuing any intervention.  She has been started on  baby aspirin and per son has done fairly well with it, certainly on examination she does not have any focal neurological findings.  We talked about secondary stroke and TIA prevention at length today.  I would like to do additional diagnostics in the form of carotid Doppler testing and a sleep study.  She is okay with scheduling the carotid ultrasound but would like to think about sleep study testing.  She is advised that obstructive sleep apnea can increase the risk for irregular heartbeat and stroke. She is supposed to see a cardiologist.  I will not order an echocardiogram as the cardiologist may want to do additional testing.  We will call them with the carotid Doppler results and they are encouraged to call if she is agreeable to pursuing a sleep study.  We talked about sleep apnea treatment in the form of CPAP.  She is encouraged to stay well-hydrated with water, she may be suboptimal in her hydration. She has a good support system. She did not have a significant orthostatic blood pressure drop today thankfully. For now, I suggested as needed follow-up with me.  I answered all their questions today and the patient and her son were in agreement.   Star Age, MD, PhD

## 2019-04-29 NOTE — Patient Instructions (Signed)
We will do a carotid Doppler ultrasound to look at the main neck arteries. We will call you with the test results. Likely, they will call to schedule after insurance authorizes the test. Most likely, you will go to [facility] for the test, but you may be able to choose another location if preferred.  You have some risk factors for stroke and TIA.  You may benefit from doing a sleep study to rule obstructive sleep apnea (aka OSA). If you have OSA, we can consider treatment with CPAP.   Please remember, the long-term risks and ramifications of untreated moderate to severe obstructive sleep apnea are: increased Cardiovascular disease, including congestive heart failure, stroke, difficult to control hypertension, treatment resistant obesity, arrhythmias, especially irregular heartbeat commonly known as A. Fib. (atrial fibrillation); even type 2 diabetes has been linked to untreated OSA.   Please keep your appointment with the cardiologist. They will probably order an  echocardiogram, which is an ultrasound of the heart. I will not order this test from my end.  Continue exercising regularly and take your medications as directed. As discussed, secondary prevention is key after a stroke. This means: taking care of blood sugar values or diabetes management (A1c goal of less than 7.0), good blood pressure (hypertension) control and optimizing cholesterol management (with LDL goal of less than 70), exercising daily or regularly within your own mobility limitations of course, and overall cardiovascular risk factor reduction, which includes screening for and treatment of obstructive sleep apnea (OSA) and weight management.   Please try to hydrate well with water: 6-8 cups per day. For now, I can see you back as needed. Let us know, if you are ready to schedule your sleep study.

## 2019-04-29 NOTE — Progress Notes (Signed)
Epworth Sleepiness Scale 0= would never doze 1= slight chance of dozing 2= moderate chance of dozing 3= high chance of dozing  Sitting and reading: 3 Watching TV: 1 Sitting inactive in a public place (ex. Theater or meeting): 0 As a passenger in a car for an hour without a break: 0 Lying down to rest in the afternoon: 3 Sitting and talking to someone: 0 Sitting quietly after lunch (no alcohol): 2 In a car, while stopped in traffic: 0 Total: 9

## 2019-05-01 ENCOUNTER — Other Ambulatory Visit: Payer: Self-pay

## 2019-06-23 ENCOUNTER — Encounter: Payer: Self-pay | Admitting: Internal Medicine

## 2019-06-23 ENCOUNTER — Other Ambulatory Visit: Payer: Self-pay

## 2019-06-23 ENCOUNTER — Ambulatory Visit: Payer: Medicare Other | Admitting: Internal Medicine

## 2019-06-23 ENCOUNTER — Ambulatory Visit (INDEPENDENT_AMBULATORY_CARE_PROVIDER_SITE_OTHER): Payer: Medicare Other | Admitting: Internal Medicine

## 2019-06-23 ENCOUNTER — Telehealth: Payer: Self-pay | Admitting: Internal Medicine

## 2019-06-23 ENCOUNTER — Encounter (INDEPENDENT_AMBULATORY_CARE_PROVIDER_SITE_OTHER): Payer: Self-pay

## 2019-06-23 VITALS — BP 149/72 | HR 41 | Ht 62.0 in | Wt 178.6 lb

## 2019-06-23 DIAGNOSIS — R001 Bradycardia, unspecified: Secondary | ICD-10-CM | POA: Diagnosis not present

## 2019-06-23 NOTE — Progress Notes (Signed)
Cardiology Office Note   Date:  06/23/2019   ID:  Lauren Moreno, DOB 1927-10-20, MRN [id number]  PCP:  Philmore Pali, NP  Cardiologist:   Dorris Carnes, MD    Pt presents for eval of bradycardia, irreg HB  Referred by L Lam   History of Present Illness: Lauren Moreno is a 85 y.o. female with a history of TIA, HTN, HL, chest pain, palpitations, bradycardia (on propranolol), falls, intermitt dizziness with standing In July she went to [facility] ED for blurry vision, double vison   Transient   HR slow at time   Irreg   MRI brain showed chronic small vessel changes, P1 stenosis.  Seen in neurology   NOt orthostatic at that visit  Carotid USN ordered Pt notes intermitt chest pressure at night     Pt is not very active   Breathing is stable       Current Meds  Medication Sig  . acetaminophen (TYLENOL) 500 MG tablet Take 1,000 mg by mouth every 6 (six) hours as needed for moderate pain.  Marland Kitchen aspirin EC 81 MG tablet Take 81 mg by mouth daily.  . diphenhydrAMINE (BENADRYL) 25 MG tablet Take 25 mg by mouth every 6 (six) hours as needed for allergies.  . Multiple Vitamin (MULTIVITAMIN WITH MINERALS) TABS tablet Take 1 tablet by mouth daily.  . nitroGLYCERIN (NITROSTAT) 0.4 MG SL tablet Place 0.4 mg under the tongue every 5 (five) minutes as needed for chest pain (3 doses max).      Allergies:   Penicillins and Shrimp [shellfish allergy]   Past Medical History:  Diagnosis Date  . Hyperlipidemia   . Hypertension   . Thyroid disease     Past Surgical History:  Procedure Laterality Date  . APPENDECTOMY    . FRACTURE SURGERY    . HEMORROIDECTOMY    . left arm fracture    . ORIF TIBIA & FIBULA FRACTURES     right leg  . PARTIAL HYSTERECTOMY    . SHOULDER ARTHROSCOPY     left  . TONSILLECTOMY AND ADENOIDECTOMY       Social History:  The patient  reports that she has never smoked. She has never used smokeless tobacco. She reports that she does not drink alcohol or use drugs.    Family History:  The patient's family history includes Cancer in an other family member.    ROS:  Please see the history of present illness. All other systems are reviewed and  Negative to the above problem except as noted.    PHYSICAL EXAM: VS:  BP (!) 149/72   Pulse (!) 41   Ht 5\' 2"  (1.575 m)   Wt 178 lb 9.6 oz (81 kg)   BMI 32.67 kg/m   GEN: Obese 85 yo in NAD  in no acute distress  HEENT: normal  Neck: no JVD, carotid bruits,  Cardiac: RRR; no murmurs, rubs, or gallops,TR LE edema  Respiratory:  clear to auscultation bilaterally, normal work of breathing GI: soft, nontender, nondistended, + BS  No hepatomegaly  MS: no deformity Moving all extremities  Legs tender   Skin: warm and dry, no rash Neuro:  Strength and sensation are intact Psych: euthymic mood, full affect   EKG:  EKG is not ordered today.  On 7/7:  SR 87 bpm   Frequent PACs      Lipid Panel    Component Value Date/Time   CHOL  10/07/2010 0550    143  ATP III CLASSIFICATION:  <200     mg/dL   Desirable  200-239  mg/dL   Borderline High  >=240    mg/dL   High          TRIG 78 10/07/2010 0550   HDL 35 (L) 10/07/2010 0550   CHOLHDL 4.1 10/07/2010 0550   VLDL 16 10/07/2010 0550   LDLCALC  10/07/2010 0550    92        Total Cholesterol/HDL:CHD Risk Coronary Heart Disease Risk Table                     Men   Women  1/2 Average Risk   3.4   3.3  Average Risk       5.0   4.4  2 X Average Risk   9.6   7.1  3 X Average Risk  23.4   11.0        Use the calculated Patient Ratio above and the CHD Risk Table to determine the patient's CHD Risk.        ATP III CLASSIFICATION (LDL):  <100     mg/dL   Optimal  100-129  mg/dL   Near or Above                    Optimal  130-159  mg/dL   Borderline  160-189  mg/dL   High  >190     mg/dL   Very High      Wt Readings from Last 3 Encounters:  06/23/19 178 lb 9.6 oz (81 kg)  04/29/19 180 lb (81.6 kg)  04/08/19 170 lb (77.1 kg)      ASSESSMENT  AND PLAN:  1  Bradycardia    Pt with frequent PACs on monitor   Will set up for holter monitor to eval for signif pauses  2  Hx TIA   Again, holter monitor to eval for arrhythmia (afib)  3  HTN  BP is elevated  Follow for now     Current medicines are reviewed at length with the patient today.  The patient does not have concerns regarding medicines.  Signed, Dorris Carnes, MD  06/23/2019 2:43 PM    [address], [place], [place]  [address] Phone: [phone]; Fax: [phone]

## 2019-06-23 NOTE — Telephone Encounter (Signed)
error 

## 2019-06-23 NOTE — Patient Instructions (Signed)
Medication Instructions:  No changes If you need a refill on your cardiac medications before your next appointment, please call your pharmacy.   Lab work: none If you have labs (blood work) drawn today and your tests are completely normal, you will receive your results only by: Marland Kitchen MyChart Message (if you have MyChart) OR . A paper copy in the mail If you have any lab test that is abnormal or we need to change your treatment, we will call you to review the results.  Testing/Procedures: Your physician has recommended that you wear a heart monitor. Heart monitors are medical devices that record the heart's electrical activity. Doctors most often use these monitors to diagnose arrhythmias. Arrhythmias are problems with the speed or rhythm of the heartbeat. The monitor is a small, portable device. You can wear one while you do your normal daily activities. This is usually used to diagnose what is causing palpitations/syncope (passing out).   Follow-Up: Follow up with your physician will depend on test results.  Any Other Special Instructions Will Be Listed Below (If Applicable).

## 2019-06-25 ENCOUNTER — Telehealth: Payer: Self-pay | Admitting: *Deleted

## 2019-06-25 NOTE — Telephone Encounter (Signed)
14 day ZIO XT long term holter monitor to be mailed to the patients home.  Instructions reviewed briefly as they are included in the monitor kit. 

## 2019-06-25 NOTE — Telephone Encounter (Signed)
Follow Up  Patient's daughter returning call about the monitor. Please give patient's daughter a call back.

## 2019-06-27 ENCOUNTER — Other Ambulatory Visit (INDEPENDENT_AMBULATORY_CARE_PROVIDER_SITE_OTHER): Payer: Medicare Other

## 2019-06-27 DIAGNOSIS — R001 Bradycardia, unspecified: Secondary | ICD-10-CM

## 2019-07-05 ENCOUNTER — Emergency Department (HOSPITAL_COMMUNITY): Payer: Medicare Other

## 2019-07-05 ENCOUNTER — Inpatient Hospital Stay (HOSPITAL_COMMUNITY)
Admission: EM | Admit: 2019-07-05 | Discharge: 2019-07-11 | DRG: 482 | Disposition: A | Discharge status: Skilled Nursing Facility | Payer: Medicare Other | Attending: Internal Medicine | Admitting: Internal Medicine

## 2019-07-05 ENCOUNTER — Encounter (HOSPITAL_COMMUNITY): Payer: Self-pay | Admitting: *Deleted

## 2019-07-05 ENCOUNTER — Other Ambulatory Visit: Payer: Self-pay

## 2019-07-05 DIAGNOSIS — I4891 Unspecified atrial fibrillation: Secondary | ICD-10-CM | POA: Diagnosis not present

## 2019-07-05 DIAGNOSIS — M47812 Spondylosis without myelopathy or radiculopathy, cervical region: Secondary | ICD-10-CM | POA: Diagnosis present

## 2019-07-05 DIAGNOSIS — R229 Localized swelling, mass and lump, unspecified: Secondary | ICD-10-CM | POA: Diagnosis present

## 2019-07-05 DIAGNOSIS — Z7982 Long term (current) use of aspirin: Secondary | ICD-10-CM

## 2019-07-05 DIAGNOSIS — Z4789 Encounter for other orthopedic aftercare: Secondary | ICD-10-CM | POA: Diagnosis not present

## 2019-07-05 DIAGNOSIS — S0990XA Unspecified injury of head, initial encounter: Secondary | ICD-10-CM | POA: Diagnosis not present

## 2019-07-05 DIAGNOSIS — S8992XA Unspecified injury of left lower leg, initial encounter: Secondary | ICD-10-CM | POA: Diagnosis not present

## 2019-07-05 DIAGNOSIS — E079 Disorder of thyroid, unspecified: Secondary | ICD-10-CM | POA: Diagnosis not present

## 2019-07-05 DIAGNOSIS — M25432 Effusion, left wrist: Secondary | ICD-10-CM | POA: Diagnosis present

## 2019-07-05 DIAGNOSIS — R278 Other lack of coordination: Secondary | ICD-10-CM | POA: Diagnosis not present

## 2019-07-05 DIAGNOSIS — R41841 Cognitive communication deficit: Secondary | ICD-10-CM | POA: Diagnosis not present

## 2019-07-05 DIAGNOSIS — I509 Heart failure, unspecified: Secondary | ICD-10-CM | POA: Diagnosis not present

## 2019-07-05 DIAGNOSIS — W1839XA Other fall on same level, initial encounter: Secondary | ICD-10-CM | POA: Diagnosis present

## 2019-07-05 DIAGNOSIS — M25519 Pain in unspecified shoulder: Secondary | ICD-10-CM | POA: Diagnosis not present

## 2019-07-05 DIAGNOSIS — S72142D Displaced intertrochanteric fracture of left femur, subsequent encounter for closed fracture with routine healing: Secondary | ICD-10-CM | POA: Diagnosis not present

## 2019-07-05 DIAGNOSIS — G4700 Insomnia, unspecified: Secondary | ICD-10-CM | POA: Diagnosis present

## 2019-07-05 DIAGNOSIS — Z7401 Bed confinement status: Secondary | ICD-10-CM | POA: Diagnosis not present

## 2019-07-05 DIAGNOSIS — R0789 Other chest pain: Secondary | ICD-10-CM | POA: Diagnosis not present

## 2019-07-05 DIAGNOSIS — M25532 Pain in left wrist: Secondary | ICD-10-CM | POA: Diagnosis not present

## 2019-07-05 DIAGNOSIS — W19XXXD Unspecified fall, subsequent encounter: Secondary | ICD-10-CM | POA: Diagnosis not present

## 2019-07-05 DIAGNOSIS — Z6832 Body mass index (BMI) 32.0-32.9, adult: Secondary | ICD-10-CM

## 2019-07-05 DIAGNOSIS — S72142A Displaced intertrochanteric fracture of left femur, initial encounter for closed fracture: Principal | ICD-10-CM | POA: Diagnosis present

## 2019-07-05 DIAGNOSIS — M542 Cervicalgia: Secondary | ICD-10-CM | POA: Diagnosis not present

## 2019-07-05 DIAGNOSIS — Z880 Allergy status to penicillin: Secondary | ICD-10-CM | POA: Diagnosis not present

## 2019-07-05 DIAGNOSIS — S299XXA Unspecified injury of thorax, initial encounter: Secondary | ICD-10-CM | POA: Diagnosis not present

## 2019-07-05 DIAGNOSIS — R58 Hemorrhage, not elsewhere classified: Secondary | ICD-10-CM | POA: Diagnosis not present

## 2019-07-05 DIAGNOSIS — Y92000 Kitchen of unspecified non-institutional (private) residence as  the place of occurrence of the external cause: Secondary | ICD-10-CM | POA: Diagnosis not present

## 2019-07-05 DIAGNOSIS — M6281 Muscle weakness (generalized): Secondary | ICD-10-CM | POA: Diagnosis not present

## 2019-07-05 DIAGNOSIS — R4181 Age-related cognitive decline: Secondary | ICD-10-CM | POA: Diagnosis not present

## 2019-07-05 DIAGNOSIS — Z91013 Allergy to seafood: Secondary | ICD-10-CM

## 2019-07-05 DIAGNOSIS — I499 Cardiac arrhythmia, unspecified: Secondary | ICD-10-CM | POA: Diagnosis not present

## 2019-07-05 DIAGNOSIS — Z20828 Contact with and (suspected) exposure to other viral communicable diseases: Secondary | ICD-10-CM | POA: Diagnosis not present

## 2019-07-05 DIAGNOSIS — R9389 Abnormal findings on diagnostic imaging of other specified body structures: Secondary | ICD-10-CM | POA: Diagnosis present

## 2019-07-05 DIAGNOSIS — E669 Obesity, unspecified: Secondary | ICD-10-CM | POA: Diagnosis not present

## 2019-07-05 DIAGNOSIS — E785 Hyperlipidemia, unspecified: Secondary | ICD-10-CM | POA: Diagnosis not present

## 2019-07-05 DIAGNOSIS — R110 Nausea: Secondary | ICD-10-CM | POA: Diagnosis not present

## 2019-07-05 DIAGNOSIS — Z5189 Encounter for other specified aftercare: Secondary | ICD-10-CM | POA: Diagnosis not present

## 2019-07-05 DIAGNOSIS — Z79899 Other long term (current) drug therapy: Secondary | ICD-10-CM | POA: Diagnosis not present

## 2019-07-05 DIAGNOSIS — J309 Allergic rhinitis, unspecified: Secondary | ICD-10-CM | POA: Diagnosis not present

## 2019-07-05 DIAGNOSIS — Z90711 Acquired absence of uterus with remaining cervical stump: Secondary | ICD-10-CM | POA: Diagnosis not present

## 2019-07-05 DIAGNOSIS — R52 Pain, unspecified: Secondary | ICD-10-CM | POA: Diagnosis not present

## 2019-07-05 DIAGNOSIS — M25552 Pain in left hip: Secondary | ICD-10-CM | POA: Diagnosis not present

## 2019-07-05 DIAGNOSIS — K5900 Constipation, unspecified: Secondary | ICD-10-CM | POA: Diagnosis not present

## 2019-07-05 DIAGNOSIS — Z03818 Encounter for observation for suspected exposure to other biological agents ruled out: Secondary | ICD-10-CM | POA: Diagnosis not present

## 2019-07-05 DIAGNOSIS — W19XXXA Unspecified fall, initial encounter: Secondary | ICD-10-CM | POA: Diagnosis not present

## 2019-07-05 DIAGNOSIS — M25539 Pain in unspecified wrist: Secondary | ICD-10-CM

## 2019-07-05 DIAGNOSIS — I959 Hypotension, unspecified: Secondary | ICD-10-CM | POA: Diagnosis not present

## 2019-07-05 DIAGNOSIS — S199XXA Unspecified injury of neck, initial encounter: Secondary | ICD-10-CM | POA: Diagnosis not present

## 2019-07-05 DIAGNOSIS — I10 Essential (primary) hypertension: Secondary | ICD-10-CM | POA: Diagnosis not present

## 2019-07-05 DIAGNOSIS — I110 Hypertensive heart disease with heart failure: Secondary | ICD-10-CM | POA: Diagnosis not present

## 2019-07-05 DIAGNOSIS — Z9181 History of falling: Secondary | ICD-10-CM | POA: Diagnosis not present

## 2019-07-05 DIAGNOSIS — R2681 Unsteadiness on feet: Secondary | ICD-10-CM | POA: Diagnosis not present

## 2019-07-05 DIAGNOSIS — Z7901 Long term (current) use of anticoagulants: Secondary | ICD-10-CM | POA: Diagnosis not present

## 2019-07-05 DIAGNOSIS — R001 Bradycardia, unspecified: Secondary | ICD-10-CM | POA: Diagnosis not present

## 2019-07-05 DIAGNOSIS — Z419 Encounter for procedure for purposes other than remedying health state, unspecified: Secondary | ICD-10-CM

## 2019-07-05 DIAGNOSIS — M2550 Pain in unspecified joint: Secondary | ICD-10-CM | POA: Diagnosis not present

## 2019-07-05 HISTORY — DX: Displaced intertrochanteric fracture of unspecified femur, initial encounter for closed fracture: S72.143A

## 2019-07-05 LAB — CBC WITH DIFFERENTIAL/PLATELET
Abs Immature Granulocytes: 0.08 10*3/uL — ABNORMAL HIGH (ref 0.00–0.07)
Basophils Absolute: 0 10*3/uL (ref 0.0–0.1)
Basophils Relative: 0 %
Eosinophils Absolute: 0.1 10*3/uL (ref 0.0–0.5)
Eosinophils Relative: 1 %
HCT: 41.9 % (ref 36.0–46.0)
Hemoglobin: 13.9 g/dL (ref 12.0–15.0)
Immature Granulocytes: 1 %
Lymphocytes Relative: 9 %
Lymphs Abs: 1.3 10*3/uL (ref 0.7–4.0)
MCH: 31.6 pg (ref 26.0–34.0)
MCHC: 33.2 g/dL (ref 30.0–36.0)
MCV: 95.2 fL (ref 80.0–100.0)
Monocytes Absolute: 0.9 10*3/uL (ref 0.1–1.0)
Monocytes Relative: 6 %
Neutro Abs: 11.9 10*3/uL — ABNORMAL HIGH (ref 1.7–7.7)
Neutrophils Relative %: 83 %
Platelets: 189 10*3/uL (ref 150–400)
RBC: 4.4 MIL/uL (ref 3.87–5.11)
RDW: 13.5 % (ref 11.5–15.5)
WBC: 14.3 10*3/uL — ABNORMAL HIGH (ref 4.0–10.5)
nRBC: 0 % (ref 0.0–0.2)

## 2019-07-05 LAB — COMPREHENSIVE METABOLIC PANEL
ALT: 13 U/L (ref 0–44)
AST: 22 U/L (ref 15–41)
Albumin: 3.3 g/dL — ABNORMAL LOW (ref 3.5–5.0)
Alkaline Phosphatase: 75 U/L (ref 38–126)
Anion gap: 12 (ref 5–15)
BUN: 17 mg/dL (ref 8–23)
CO2: 23 mmol/L (ref 22–32)
Calcium: 8.7 mg/dL — ABNORMAL LOW (ref 8.9–10.3)
Chloride: 101 mmol/L (ref 98–111)
Creatinine, Ser: 0.8 mg/dL (ref 0.44–1.00)
GFR calc Af Amer: >60 mL/min (ref >60)
GFR calc non Af Amer: >60 mL/min (ref >60)
Glucose, Bld: 121 mg/dL — ABNORMAL HIGH (ref 70–99)
Potassium: 3.6 mmol/L (ref 3.5–5.1)
Sodium: 136 mmol/L (ref 135–145)
Total Bilirubin: 0.8 mg/dL (ref 0.3–1.2)
Total Protein: 6.2 g/dL — ABNORMAL LOW (ref 6.5–8.1)

## 2019-07-05 LAB — URINALYSIS, ROUTINE W REFLEX MICROSCOPIC
Bilirubin Urine: NEGATIVE
Glucose, UA: NEGATIVE mg/dL
Hgb urine dipstick: NEGATIVE
Ketones, ur: 20 mg/dL — AB
Leukocytes,Ua: NEGATIVE
Nitrite: NEGATIVE
Protein, ur: NEGATIVE mg/dL
Specific Gravity, Urine: 1.012 (ref 1.005–1.030)
pH: 7 (ref 5.0–8.0)

## 2019-07-05 LAB — TYPE AND SCREEN
ABO/RH(D): A POS
Antibody Screen: NEGATIVE

## 2019-07-05 LAB — PROTIME-INR
INR: 1 (ref 0.8–1.2)
Prothrombin Time: 13.4 seconds (ref 11.4–15.2)

## 2019-07-05 LAB — SARS CORONAVIRUS 2 BY RT PCR (HOSPITAL ORDER, PERFORMED IN ~~LOC~~ HOSPITAL LAB): SARS Coronavirus 2: NEGATIVE

## 2019-07-05 MED ORDER — HYDROMORPHONE HCL 1 MG/ML IJ SOLN
0.5000 mg | Freq: Once | INTRAMUSCULAR | Status: AC
  Administered 2019-07-05: 0.5 mg via INTRAVENOUS
  Filled 2019-07-05: qty 1

## 2019-07-05 MED ORDER — ACETAMINOPHEN 325 MG PO TABS: 650.0000 mg | ORAL_TABLET | Freq: Four times a day (QID) | ORAL | Status: DC | PRN

## 2019-07-05 MED ORDER — MORPHINE SULFATE (PF) 4 MG/ML IV SOLN
4.0000 mg | INTRAVENOUS | Status: DC | PRN
  Administered 2019-07-05: 4 mg via INTRAVENOUS
  Filled 2019-07-05: qty 1

## 2019-07-05 MED ORDER — SODIUM CHLORIDE 0.9% FLUSH: 3.0000 mL | INTRAVENOUS | Status: DC | PRN

## 2019-07-05 MED ORDER — ACETAMINOPHEN 650 MG RE SUPP: 650.0000 mg | Freq: Four times a day (QID) | RECTAL | Status: DC | PRN

## 2019-07-05 MED ORDER — HYDROMORPHONE HCL 1 MG/ML IJ SOLN
0.5000 mg | INTRAMUSCULAR | Status: DC | PRN
  Administered 2019-07-06 (×3): 0.5 mg via INTRAVENOUS
  Filled 2019-07-05 (×3): qty 0.5

## 2019-07-05 MED ORDER — SODIUM CHLORIDE 0.9 % IV SOLN: 250.0000 mL | INTRAVENOUS | Status: DC | PRN

## 2019-07-05 MED ORDER — ONDANSETRON HCL 4 MG/2ML IJ SOLN: 4.0000 mg | Freq: Four times a day (QID) | INTRAMUSCULAR | Status: DC | PRN

## 2019-07-05 MED ORDER — MIRTAZAPINE 15 MG PO TABS
7.5000 mg | ORAL_TABLET | Freq: Every evening | ORAL | Status: DC | PRN
  Filled 2019-07-05: qty 1

## 2019-07-05 MED ORDER — ASPIRIN EC 81 MG PO TBEC
81.0000 mg | DELAYED_RELEASE_TABLET | Freq: Every morning | ORAL | Status: DC
  Filled 2019-07-05: qty 1

## 2019-07-05 MED ORDER — ONDANSETRON HCL 4 MG/2ML IJ SOLN
4.0000 mg | Freq: Once | INTRAMUSCULAR | Status: AC
  Administered 2019-07-05: 21:00:00 4 mg via INTRAVENOUS
  Filled 2019-07-05: qty 2

## 2019-07-05 NOTE — ED Provider Notes (Signed)
[facility] EMERGENCY DEPARTMENT Provider Note   CSN: [id number] Arrival date & time: 07/05/19  [id number]     History   Chief Complaint Chief Complaint  Patient presents with   Fall   Hip Pain    lt    HPI Lauren Moreno is a 85 y.o. female.     Patient with history of hypertension, hyperlipidemia, thyroid disease --presents with left hip pain, concern for fracture.  Patient states that she was preparing green beans in her kitchen when she fell backwards striking her head.  She landed on her left hip as well.  Patient pushed her life alert button.  EMS was called and transported the patient to the hospital.  Patient was placed in a rigid cervical collar prior to arrival.  Patient has a Zio patch on currently for bradycardia prescribed by her cardiologist.  Patient denies headache, vomiting.  No treatments prior to arrival.  No chest pain or shortness of breath.  No abdominal pain.  The onset of this condition was acute. The course is constant. Aggravating factors: movement. Alleviating factors: none.       Past Medical History:  Diagnosis Date   Hyperlipidemia    Hypertension    Thyroid disease     Patient Active Problem List   Diagnosis Date Noted   Weakness generalized    Chronic venous insufficiency    Fall    Dehydration    Near syncope 03/21/2015    Past Surgical History:  Procedure Laterality Date   APPENDECTOMY     FRACTURE SURGERY     HEMORROIDECTOMY     left arm fracture     ORIF TIBIA & FIBULA FRACTURES     right leg   PARTIAL HYSTERECTOMY     SHOULDER ARTHROSCOPY     left   TONSILLECTOMY AND ADENOIDECTOMY       OB History   No obstetric history on file.      Home Medications    Prior to Admission medications   Medication Sig Start Date End Date Taking? Authorizing Provider  acetaminophen (TYLENOL) 500 MG tablet Take 1,000 mg by mouth every 6 (six) hours as needed for moderate pain.    [provider]  aspirin EC 81 MG tablet Take 81 mg by mouth daily.    [provider]  diphenhydrAMINE (BENADRYL) 25 MG tablet Take 25 mg by mouth every 6 (six) hours as needed for allergies.    [provider]  Multiple Vitamin (MULTIVITAMIN WITH MINERALS) TABS tablet Take 1 tablet by mouth daily.    [provider]  nitroGLYCERIN (NITROSTAT) 0.4 MG SL tablet Place 0.4 mg under the tongue every 5 (five) minutes as needed for chest pain (3 doses max).     [provider]    Family History Family History  Problem Relation Age of Onset   Cancer Other     Social History Social History   Tobacco Use   Smoking status: Never Smoker   Smokeless tobacco: Never Used  Substance Use Topics   Alcohol use: No   Drug use: No     Allergies   Penicillins and Shrimp [shellfish allergy]   Review of Systems Review of Systems  Constitutional: Negative for fever.  HENT: Negative for rhinorrhea and sore throat.   Eyes: Negative for redness.  Respiratory: Negative for cough and shortness of breath.   Cardiovascular: Negative for chest pain.  Gastrointestinal: Negative for abdominal pain, diarrhea, nausea and vomiting.  Genitourinary: Negative for dysuria.  Musculoskeletal: Positive for arthralgias, myalgias and neck pain. Negative for joint swelling.  Skin: Negative for rash.  Neurological: Negative for headaches.     Physical Exam Updated Vital Signs BP (!) 150/67 (BP Location: Right Arm)    Pulse 77    Temp 97.7 F (36.5 C) (Oral)    Resp 16    Ht 5\' 2"  (1.575 m)    Wt 81.6 kg    SpO2 97%    BMI 32.92 kg/m   Physical Exam Vitals signs and nursing note reviewed.  Constitutional:      Appearance: She is well-developed.  HENT:     Head: Normocephalic and atraumatic.  Eyes:     General:        Right eye: No discharge.        Left eye: No discharge.     Conjunctiva/sclera: Conjunctivae normal.  Neck:     Musculoskeletal: Normal range of  motion and neck supple.  Cardiovascular:     Rate and Rhythm: Normal rate and regular rhythm.     Pulses:          Radial pulses are 2+ on the right side and 2+ on the left side.       Dorsalis pedis pulses are 2+ on the right side and 2+ on the left side.     Heart sounds: Normal heart sounds.  Pulmonary:     Effort: Pulmonary effort is normal.     Breath sounds: Normal breath sounds.  Abdominal:     Palpations: Abdomen is soft.     Tenderness: There is no abdominal tenderness. There is no guarding or rebound.  Musculoskeletal:     Right shoulder: Normal.     Left shoulder: Normal.     Right elbow: Normal.    Left hip: She exhibits decreased range of motion and tenderness.     Left knee: Normal.     Left ankle: Normal.     Comments: Left lower extremity is foreshortened and externally rotated.  Skin:    General: Skin is warm and dry.  Neurological:     Mental Status: She is alert.     Comments: Gross distal CMS is intact in the lower extremities bilaterally.      ED Treatments / Results  Labs (all labs ordered are listed, but only abnormal results are displayed) Labs Reviewed  CBC WITH DIFFERENTIAL/PLATELET - Abnormal; Notable for the following components:      Result Value   WBC 14.3 (*)    Neutro Abs 11.9 (*)    Abs Immature Granulocytes 0.08 (*)    All other components within normal limits  COMPREHENSIVE METABOLIC PANEL - Abnormal; Notable for the following components:   Glucose, Bld 121 (*)    Calcium 8.7 (*)    Total Protein 6.2 (*)    Albumin 3.3 (*)    All other components within normal limits  URINALYSIS, ROUTINE W REFLEX MICROSCOPIC - Abnormal; Notable for the following components:   Color, Urine STRAW (*)    Ketones, ur 20 (*)    All other components within normal limits  SARS CORONAVIRUS 2 (HOSPITAL ORDER, [facility])  PROTIME-INR  COMPREHENSIVE METABOLIC PANEL  CBC  TSH  TYPE AND SCREEN  ABO/RH   ED ECG REPORT   Date:  07/05/2019  Rate: 79  Rhythm: normal sinus rhythm, PACs  QRS Axis: left  Intervals: normal  ST/T Wave abnormalities: normal  Conduction Disutrbances:left  anterior fascicular block  Narrative Interpretation:   Old EKG Reviewed: unchanged  I have personally reviewed the EKG tracing and agree with the computerized printout as noted.  Radiology Dg Chest 1 View  Result Date: 07/05/2019 CLINICAL DATA:  Left hip pain following a fall. EXAM: CHEST  1 VIEW COMPARISON:  04/08/2019 FINDINGS: Normal sized heart. Tortuous aorta. Clear lungs. Mild increase in prominence of the interstitial markings with some Kerley lines on the left. Decreased prominence of the vasculature. Diffuse osteopenia. Electronic devices overlying the left shoulder and left neck. IMPRESSION: Minimal changes of congestive heart failure superimposed on chronic interstitial lung disease. Electronically Signed   By: Claudie Revering M.D.   On: 07/05/2019 20:46   Dg Knee 2 Views Left  Result Date: 07/05/2019 CLINICAL DATA:  Left hip pain following a fall. EXAM: LEFT KNEE - 1-2 VIEW COMPARISON:  None. FINDINGS: Limited examination due to obliquity on the lateral view, distortion on the frontal view due to the fact that was obtained cross-table and no oblique views. No gross fracture or dislocation and no gross effusion seen. IMPRESSION: Limited examination demonstrating no gross fracture or effusion. Electronically Signed   By: Claudie Revering M.D.   On: 07/05/2019 20:44   Ct Head Wo Contrast  Result Date: 07/05/2019 CLINICAL DATA:  Neck pain after falling and hitting the back her head on kitchen cabinets tonight. EXAM: CT HEAD WITHOUT CONTRAST CT CERVICAL SPINE WITHOUT CONTRAST TECHNIQUE: Multidetector CT imaging of the head and cervical spine was performed following the standard protocol without intravenous contrast. Multiplanar CT image reconstructions of the cervical spine were also generated. COMPARISON:  Brain MR dated 04/08/2019. Head CT  dated 04/20/2015. Cervical spine CT dated 06/05/2012 FINDINGS: CT HEAD FINDINGS Brain: Stable moderately enlarged ventricles and subarachnoid spaces. Mild-to-moderate patchy white matter low density in both cerebral hemispheres without significant change. No intracranial hemorrhage, mass lesion or CT evidence of acute infarction. Vascular: No hyperdense vessel or unexpected calcification. Skull: Normal. Negative for fracture or focal lesion. Sinuses/Orbits: Status post bilateral cataract extraction. Mild left ethmoid sinus mucosal thickening. Other: None. CT CERVICAL SPINE FINDINGS Alignment: Normal. Skull base and vertebrae: No acute fracture. No primary bone lesion or focal pathologic process. Soft tissues and spinal canal: No prevertebral fluid or swelling. No visible canal hematoma. Disc levels:  Mild multilevel degenerative changes. Upper chest: Unremarkable. Other: None. IMPRESSION: 1. No skull fracture or intracranial hemorrhage. 2. No cervical spine fracture or subluxation. 3. Stable moderate diffuse cerebral and cerebellar atrophy and mild-to-moderate chronic small vessel white matter ischemic changes in both cerebral hemispheres. 4. Mild cervical spine degenerative changes. Electronically Signed   By: Claudie Revering M.D.   On: 07/05/2019 20:56   Ct Cervical Spine Wo Contrast  Result Date: 07/05/2019 CLINICAL DATA:  Neck pain after falling and hitting the back her head on kitchen cabinets tonight. EXAM: CT HEAD WITHOUT CONTRAST CT CERVICAL SPINE WITHOUT CONTRAST TECHNIQUE: Multidetector CT imaging of the head and cervical spine was performed following the standard protocol without intravenous contrast. Multiplanar CT image reconstructions of the cervical spine were also generated. COMPARISON:  Brain MR dated 04/08/2019. Head CT dated 04/20/2015. Cervical spine CT dated 06/05/2012 FINDINGS: CT HEAD FINDINGS Brain: Stable moderately enlarged ventricles and subarachnoid spaces. Mild-to-moderate patchy white  matter low density in both cerebral hemispheres without significant change. No intracranial hemorrhage, mass lesion or CT evidence of acute infarction. Vascular: No hyperdense vessel or unexpected calcification. Skull: Normal. Negative for fracture or focal lesion. Sinuses/Orbits: Status post bilateral  cataract extraction. Mild left ethmoid sinus mucosal thickening. Other: None. CT CERVICAL SPINE FINDINGS Alignment: Normal. Skull base and vertebrae: No acute fracture. No primary bone lesion or focal pathologic process. Soft tissues and spinal canal: No prevertebral fluid or swelling. No visible canal hematoma. Disc levels:  Mild multilevel degenerative changes. Upper chest: Unremarkable. Other: None. IMPRESSION: 1. No skull fracture or intracranial hemorrhage. 2. No cervical spine fracture or subluxation. 3. Stable moderate diffuse cerebral and cerebellar atrophy and mild-to-moderate chronic small vessel white matter ischemic changes in both cerebral hemispheres. 4. Mild cervical spine degenerative changes. Electronically Signed   By: Claudie Revering M.D.   On: 07/05/2019 20:56   Dg Hip Unilat With Pelvis 2-3 Views Left  Result Date: 07/05/2019 CLINICAL DATA:  85 year old female with suspected left hip facture after a fall. Left hip pain. EXAM: DG HIP (WITH OR WITHOUT PELVIS) 2-3V LEFT COMPARISON:  No priors. FINDINGS: There is an acute mildly displaced mildly comminuted intertrochanteric fracture of the left hip which is associated with increased varus angulation. There is also likely some rotation of the femoral head in the acetabulum causing distortion of the inter trochanteric regions. Femoral head remains located. Bony pelvis and visualized portions of the right proximal femur appear intact. IMPRESSION: 1. Acute mildly comminuted, displaced and angulated intertrochanteric fracture of the left hip. Electronically Signed   By: Vinnie Langton M.D.   On: 07/05/2019 20:43    Procedures Procedures (including  critical care time)  Medications Ordered in ED Medications  morphine 4 MG/ML injection 4 mg (4 mg Intravenous Given 07/05/19 2105)  ondansetron (ZOFRAN) injection 4 mg (4 mg Intravenous Given 07/05/19 2105)     Initial Impression / Assessment and Plan / ED Course  I have reviewed the triage vital signs and the nursing notes.  Pertinent labs & imaging results that were available during my care of the patient were reviewed by me and considered in my medical decision making (see chart for details).        Patient seen and examined. Work-up initiated. Medications ordered.   Vital signs reviewed and are as follows: BP (!) 150/67 (BP Location: Right Arm)    Pulse 77    Temp 97.7 F (36.5 C) (Oral)    Resp 16    Ht 5\' 2"  (1.575 m)    Wt 81.6 kg    SpO2 97%    BMI 32.92 kg/m   9:07 PM x-rays reviewed.  C-collar removed by myself.  Patient and son updated.  Spoke with Dr. Percell Miller.  Request n.p.o. after midnight, admission to hospitalist, plan on repair tomorrow.  Awaiting labs prior to admission.  Spoke with Dr. Maudie Mercury who will see for admission.   Final Clinical Impressions(s) / ED Diagnoses   Final diagnoses:  Closed intertrochanteric fracture of hip, left, initial encounter ([place])   Admit for hip fracture.   ED Discharge Orders    None       Carlisle Cater, Hershal Coria 07/05/19 2356    Tegeler, Gwenyth Allegra, MD 07/05/19 [phone]

## 2019-07-05 NOTE — ED Notes (Signed)
C collar removed by Vonna Kotyk, [place]

## 2019-07-05 NOTE — ED Notes (Signed)
Patient transported to X-ray 

## 2019-07-05 NOTE — ED Triage Notes (Signed)
Ems REPORTED pt fell back and hit her head on kitchen cabinets tonight. On arrival Pt's Let leg was shortened and rotated. Pt also had a c collar on for reported neck pain. Pt denies any LOC and is A/O x4 on arrival. EMS vitals : BP 135/72 , P 80 , O2 sats 96 % on room air. CBG 122.  Pt has a HX of A-fib  . Pt is also wearing a Holter monitor for 2 weeks. Pt has monitor on now.

## 2019-07-05 NOTE — H&P (Addendum)
TRH H&P    Patient Demographics:    Lauren Moreno, is a 84 y.o. female  MRN: [id number]  DOB - [birth year]  Admit Date - 07/05/2019  Referring MD/NP/PA: Carlisle Cater  Outpatient Primary MD for the patient is Philmore Pali, NP  Patient coming from:  home  Chief complaint- fal, left hip pain   HPI:    Lauren Moreno  is a 85 y.o. female, w hypertension, hyperlipidemia, thyroid disease, apparently sufferred mechanical fall and c/o left hip pain.  Pt apparently fell backwards, and hit her head, There was no syncope per her family.   In ED T 97.7  P 77 R 16, Bp 150/67  [pox 97% onRA Wt 81.6kg  CT brain / C spine IMPRESSION: 1. No skull fracture or intracranial hemorrhage. 2. No cervical spine fracture or subluxation. 3. Stable moderate diffuse cerebral and cerebellar atrophy and mild-to-moderate chronic small vessel white matter ischemic changes in both cerebral hemispheres. 4. Mild cervical spine degenerative changes.  CXR IMPRESSION: Minimal changes of congestive heart failure superimposed on chronic interstitial lung disease.  Xray L hip  IMPRESSION: 1. Acute mildly comminuted, displaced and angulated intertrochanteric fracture of the left hip.  Xray L knee IMPRESSION: Limited examination demonstrating no gross fracture or effusion.  Na 139, K 4.6,  Bun 14, Creatinine 0.70 Ast 22, Alt 13, Alb 3.3  Wbc 7.3, Hgb 15.4, Plt 191 INR 1.1 Trop <2  ->  3  Urinalysis negative  covid -19 negative  ED spoke with Percell Miller who will operate on patient in am  Pt will be admitted for left intertrochanteric hip fracture.     Review of systems:    In addition to the HPI above,  No Fever-chills, No Headache, No changes with Vision or hearing, No problems swallowing food or Liquids, No Chest pain, Cough or Shortness of Breath, No Abdominal pain, No Nausea or Vomiting, bowel movements are  regular, No Blood in stool or Urine, No dysuria, No new skin rashes or bruises,   No new weakness, tingling, numbness in any extremity, No recent weight gain or loss, No polyuria, polydypsia or polyphagia, No significant Mental Stressors.  All other systems reviewed and are negative.    Past History of the following :    Past Medical History:  Diagnosis Date   Hyperlipidemia    Hypertension    Thyroid disease       Past Surgical History:  Procedure Laterality Date   APPENDECTOMY     FRACTURE SURGERY     HEMORROIDECTOMY     left arm fracture     ORIF TIBIA & FIBULA FRACTURES     right leg   PARTIAL HYSTERECTOMY     SHOULDER ARTHROSCOPY     left   TONSILLECTOMY AND ADENOIDECTOMY        Social History:      Social History   Tobacco Use   Smoking status: Never Smoker   Smokeless tobacco: Never Used  Substance Use Topics   Alcohol use: No  Family History :     Family History  Problem Relation Age of Onset   Cancer Other        Home Medications:   Prior to Admission medications   Medication Sig Start Date End Date Taking? Authorizing Provider  acetaminophen (TYLENOL) 500 MG tablet Take 1,000 mg by mouth every 6 (six) hours as needed for moderate pain.   Yes [provider]  aspirin EC 81 MG tablet Take 81 mg by mouth every morning.    Yes [provider]  diphenhydrAMINE (BENADRYL) 25 MG tablet Take 25 mg by mouth every 6 (six) hours as needed for allergies.   Yes [provider]  Multiple Vitamin (MULTIVITAMIN WITH MINERALS) TABS tablet Take 1 tablet by mouth every morning.    Yes [provider]  nitroGLYCERIN (NITROSTAT) 0.4 MG SL tablet Place 0.4 mg under the tongue every 5 (five) minutes as needed for chest pain (3 doses max).    Yes [provider]     Allergies:     Allergies  Allergen Reactions   Penicillins Anaphylaxis    Did it involve swelling of the face/tongue/throat,  SOB, or low BP? Yes Did it involve sudden or severe rash/hives, skin peeling, or any reaction on the inside of your mouth or nose? No Did you need to seek medical attention at a hospital or doctor's office? Yes When did it last happen?young adult or middle aged adult If all above answers are "NO", may proceed with cephalosporin use.    Shrimp [Shellfish Allergy] Anaphylaxis     Physical Exam:   Vitals  Blood pressure (!) 120/56, pulse 74, temperature 97.7 F (36.5 C), temperature source Oral, resp. rate 17, height 5\' 2"  (1.575 m), weight 81.6 kg, SpO2 96 %.  1.  General: axoxo3  2. Psychiatric: euthymic  3. Neurologic: cn2-12 intact, reflexes 2+ symmetric, diffuse with no clonus, motor 5/5 in all 4 ext  4. HEENMT:  Anicteric, pupils 1.44mm symmetric, direct, consensual, near intact Neck: no jvd  5. Respiratory : CTAB  6. Cardiovascular : rrr s1, s2,    7. Gastrointestinal:  Abd: soft, nt, nd, +bs  8. Skin:  Ext: no c/c/e,  No rash  9.Musculoskeletal:  Good ROM  L wrist swelling,   Data Review:    CBC Recent Labs  Lab 07/05/19 2123  WBC 14.3*  HGB 13.9  HCT 41.9  PLT 189  MCV 95.2  MCH 31.6  MCHC 33.2  RDW 13.5  LYMPHSABS 1.3  MONOABS 0.9  EOSABS 0.1  BASOSABS 0.0   ------------------------------------------------------------------------------------------------------------------  Results for orders placed or performed during the hospital encounter of 07/05/19 (from the past 48 hour(s))  Type and screen [place]     Status: None   Collection Time: 07/05/19  9:12 PM  Result Value Ref Range   ABO/RH(D) A POS    Antibody Screen NEG    Sample Expiration      07/08/2019,2359 Performed at [facility], [place] [address], [place], [place] [address]   ABO/Rh     Status: None (Preliminary result)   Collection Time: 07/05/19  9:12 PM  Result Value Ref Range   ABO/RH(D)      A POS Performed at [facility] [address], [place], [place] [address]   SARS Coronavirus 2 [facility] order, Performed in [facility] hospital lab) Nasopharyngeal Nasopharyngeal Swab     Status: None   Collection Time: 07/05/19  9:15 PM  Specimen: Nasopharyngeal Swab  Result Value Ref Range   SARS Coronavirus 2 NEGATIVE NEGATIVE    Comment: (NOTE) If result is NEGATIVE SARS-CoV-2 target nucleic acids are NOT DETECTED. The SARS-CoV-2 RNA is generally detectable in upper and lower  respiratory specimens during the acute phase of infection. The lowest  concentration of SARS-CoV-2 viral copies this assay can detect is 250  copies / mL. A negative result does not preclude SARS-CoV-2 infection  and should not be used as the sole basis for treatment or other  patient management decisions.  A negative result may occur with  improper specimen collection / handling, submission of specimen other  than nasopharyngeal swab, presence of viral mutation(s) within the  areas targeted by this assay, and inadequate number of viral copies  (<250 copies / mL). A negative result must be combined with clinical  observations, patient history, and epidemiological information. If result is POSITIVE SARS-CoV-2 target nucleic acids are DETECTED. The SARS-CoV-2 RNA is generally detectable in upper and lower  respiratory specimens dur ing the acute phase of infection.  Positive  results are indicative of active infection with SARS-CoV-2.  Clinical  correlation with patient history and other diagnostic information is  necessary to determine patient infection status.  Positive results do  not rule out bacterial infection or co-infection with other viruses. If result is PRESUMPTIVE POSTIVE SARS-CoV-2 nucleic acids MAY BE PRESENT.   A presumptive positive result was obtained on the submitted specimen  and confirmed on repeat testing.  While 2019 novel coronavirus  (SARS-CoV-2) nucleic acids may be present in the submitted sample  additional  confirmatory testing may be necessary for epidemiological  and / or clinical management purposes  to differentiate between  SARS-CoV-2 and other Sarbecovirus currently known to infect humans.  If clinically indicated additional testing with an alternate test  methodology [id number]) is advised. The SARS-CoV-2 RNA is generally  detectable in upper and lower respiratory sp ecimens during the acute  phase of infection. The expected result is Negative. Fact Sheet for Patients:  StrictlyIdeas.no Fact Sheet for Healthcare Providers: BankingDealers.co.za This test is not yet approved or cleared by the [place] FDA and has been authorized for detection and/or diagnosis of SARS-CoV-2 by FDA under an Emergency Use Authorization (EUA).  This EUA will remain in effect (meaning this test can be used) for the duration of the COVID-19 declaration under Section 564(b)(1) of the Act, 21 U.S.C. section 360bbb-3(b)(1), unless the authorization is terminated or revoked sooner. Performed at [facility], [place] [address], [place], [place] [address]   Urinalysis, Routine w reflex microscopic     Status: Abnormal   Collection Time: 07/05/19  9:15 PM  Result Value Ref Range   Color, Urine STRAW (A) YELLOW   APPearance CLEAR CLEAR   Specific Gravity, Urine 1.012 1.005 - 1.030   pH 7.0 5.0 - 8.0   Glucose, UA NEGATIVE NEGATIVE mg/dL   Hgb urine dipstick NEGATIVE NEGATIVE   Bilirubin Urine NEGATIVE NEGATIVE   Ketones, ur 20 (A) NEGATIVE mg/dL   Protein, ur NEGATIVE NEGATIVE mg/dL   Nitrite NEGATIVE NEGATIVE   Leukocytes,Ua NEGATIVE NEGATIVE    Comment: Performed at [facility] [address] [place], [place] [address]  CBC WITH DIFFERENTIAL     Status: Abnormal   Collection Time: 07/05/19  9:23 PM  Result Value Ref Range   WBC 14.3 (H) 4.0 - 10.5 K/uL   RBC 4.40 3.87 - 5.11 MIL/uL   Hemoglobin 13.9 12.0 -  15.0 g/dL   HCT 41.9 36.0 - 46.0 %    MCV 95.2 80.0 - 100.0 fL   MCH 31.6 26.0 - 34.0 pg   MCHC 33.2 30.0 - 36.0 g/dL   RDW 13.5 11.5 - 15.5 %   Platelets 189 150 - 400 K/uL   nRBC 0.0 0.0 - 0.2 %   Neutrophils Relative % 83 %   Neutro Abs 11.9 (H) 1.7 - 7.7 K/uL   Lymphocytes Relative 9 %   Lymphs Abs 1.3 0.7 - 4.0 K/uL   Monocytes Relative 6 %   Monocytes Absolute 0.9 0.1 - 1.0 K/uL   Eosinophils Relative 1 %   Eosinophils Absolute 0.1 0.0 - 0.5 K/uL   Basophils Relative 0 %   Basophils Absolute 0.0 0.0 - 0.1 K/uL   Immature Granulocytes 1 %   Abs Immature Granulocytes 0.08 (H) 0.00 - 0.07 K/uL    Comment: Performed at [facility] [address] [place], [place] [address]  Protime-INR     Status: None   Collection Time: 07/05/19  9:23 PM  Result Value Ref Range   Prothrombin Time 13.4 11.4 - 15.2 seconds   INR 1.0 0.8 - 1.2    Comment: (NOTE) INR goal varies based on device and disease states. Performed at [facility], [place] [address], [place], [place] [address]   Comprehensive metabolic panel     Status: Abnormal   Collection Time: 07/05/19  9:23 PM  Result Value Ref Range   Sodium 136 135 - 145 mmol/L   Potassium 3.6 3.5 - 5.1 mmol/L   Chloride 101 98 - 111 mmol/L   CO2 23 22 - 32 mmol/L   Glucose, Bld 121 (H) 70 - 99 mg/dL   BUN 17 8 - 23 mg/dL   Creatinine, Ser 0.80 0.44 - 1.00 mg/dL   Calcium 8.7 (L) 8.9 - 10.3 mg/dL   Total Protein 6.2 (L) 6.5 - 8.1 g/dL   Albumin 3.3 (L) 3.5 - 5.0 g/dL   AST 22 15 - 41 U/L   ALT 13 0 - 44 U/L   Alkaline Phosphatase 75 38 - 126 U/L   Total Bilirubin 0.8 0.3 - 1.2 mg/dL   GFR calc non Af Amer >60 >60 mL/min   GFR calc Af Amer >60 >60 mL/min   Anion gap 12 5 - 15    Comment: Performed at [facility] [address] [place],  [address]    Chemistries  Recent Labs  Lab 07/05/19 2123  NA 136  K 3.6  CL 101  CO2 23  GLUCOSE 121*  BUN 17  CREATININE 0.80  CALCIUM 8.7*  AST 22  ALT 13  ALKPHOS 75  BILITOT 0.8    ------------------------------------------------------------------------------------------------------------------  ------------------------------------------------------------------------------------------------------------------ GFR: Estimated Creatinine Clearance: 48.1 mL/min (by C-G formula based on SCr of 0.8 mg/dL). Liver Function Tests: Recent Labs  Lab 07/05/19 2123  AST 22  ALT 13  ALKPHOS 75  BILITOT 0.8  PROT 6.2*  ALBUMIN 3.3*   No results for input(s): LIPASE, AMYLASE in the last 168 hours. No results for input(s): AMMONIA in the last 168 hours. Coagulation Profile: Recent Labs  Lab 07/05/19 2123  INR 1.0   Cardiac Enzymes: No results for input(s): CKTOTAL, CKMB, CKMBINDEX, TROPONINI in the last 168 hours. BNP (last 3 results) No results for input(s): PROBNP in the last 8760 hours. HbA1C: No results for input(s): HGBA1C in the last 72 hours. CBG: No results for input(s): GLUCAP  in the last 168 hours. Lipid Profile: No results for input(s): CHOL, HDL, LDLCALC, TRIG, CHOLHDL, LDLDIRECT in the last 72 hours. Thyroid Function Tests: No results for input(s): TSH, T4TOTAL, FREET4, T3FREE, THYROIDAB in the last 72 hours. Anemia Panel: No results for input(s): VITAMINB12, FOLATE, FERRITIN, TIBC, IRON, RETICCTPCT in the last 72 hours.  --------------------------------------------------------------------------------------------------------------- Urine analysis:    Component Value Date/Time   COLORURINE STRAW (A) 07/05/2019 2115   APPEARANCEUR CLEAR 07/05/2019 2115   LABSPEC 1.012 07/05/2019 2115   PHURINE 7.0 07/05/2019 2115   GLUCOSEU NEGATIVE 07/05/2019 2115   HGBUR NEGATIVE 07/05/2019 2115   BILIRUBINUR NEGATIVE 07/05/2019 2115   KETONESUR 20 (A) 07/05/2019 2115   PROTEINUR NEGATIVE 07/05/2019 2115   UROBILINOGEN 0.2 06/05/2012 1957   NITRITE NEGATIVE 07/05/2019 2115   LEUKOCYTESUR NEGATIVE 07/05/2019 2115      Imaging Results:    Dg Chest 1  View  Result Date: 07/05/2019 CLINICAL DATA:  Left hip pain following a fall. EXAM: CHEST  1 VIEW COMPARISON:  04/08/2019 FINDINGS: Normal sized heart. Tortuous aorta. Clear lungs. Mild increase in prominence of the interstitial markings with some Kerley lines on the left. Decreased prominence of the vasculature. Diffuse osteopenia. Electronic devices overlying the left shoulder and left neck. IMPRESSION: Minimal changes of congestive heart failure superimposed on chronic interstitial lung disease. Electronically Signed   By: Claudie Revering M.D.   On: 07/05/2019 20:46   Dg Knee 2 Views Left  Result Date: 07/05/2019 CLINICAL DATA:  Left hip pain following a fall. EXAM: LEFT KNEE - 1-2 VIEW COMPARISON:  None. FINDINGS: Limited examination due to obliquity on the lateral view, distortion on the frontal view due to the fact that was obtained cross-table and no oblique views. No gross fracture or dislocation and no gross effusion seen. IMPRESSION: Limited examination demonstrating no gross fracture or effusion. Electronically Signed   By: Claudie Revering M.D.   On: 07/05/2019 20:44   Ct Head Wo Contrast  Result Date: 07/05/2019 CLINICAL DATA:  Neck pain after falling and hitting the back her head on kitchen cabinets tonight. EXAM: CT HEAD WITHOUT CONTRAST CT CERVICAL SPINE WITHOUT CONTRAST TECHNIQUE: Multidetector CT imaging of the head and cervical spine was performed following the standard protocol without intravenous contrast. Multiplanar CT image reconstructions of the cervical spine were also generated. COMPARISON:  Brain MR dated 04/08/2019. Head CT dated 04/20/2015. Cervical spine CT dated 06/05/2012 FINDINGS: CT HEAD FINDINGS Brain: Stable moderately enlarged ventricles and subarachnoid spaces. Mild-to-moderate patchy white matter low density in both cerebral hemispheres without significant change. No intracranial hemorrhage, mass lesion or CT evidence of acute infarction. Vascular: No hyperdense vessel or  unexpected calcification. Skull: Normal. Negative for fracture or focal lesion. Sinuses/Orbits: Status post bilateral cataract extraction. Mild left ethmoid sinus mucosal thickening. Other: None. CT CERVICAL SPINE FINDINGS Alignment: Normal. Skull base and vertebrae: No acute fracture. No primary bone lesion or focal pathologic process. Soft tissues and spinal canal: No prevertebral fluid or swelling. No visible canal hematoma. Disc levels:  Mild multilevel degenerative changes. Upper chest: Unremarkable. Other: None. IMPRESSION: 1. No skull fracture or intracranial hemorrhage. 2. No cervical spine fracture or subluxation. 3. Stable moderate diffuse cerebral and cerebellar atrophy and mild-to-moderate chronic small vessel white matter ischemic changes in both cerebral hemispheres. 4. Mild cervical spine degenerative changes. Electronically Signed   By: Claudie Revering M.D.   On: 07/05/2019 20:56   Ct Cervical Spine Wo Contrast  Result Date: 07/05/2019 CLINICAL DATA:  Neck pain after falling  and hitting the back her head on kitchen cabinets tonight. EXAM: CT HEAD WITHOUT CONTRAST CT CERVICAL SPINE WITHOUT CONTRAST TECHNIQUE: Multidetector CT imaging of the head and cervical spine was performed following the standard protocol without intravenous contrast. Multiplanar CT image reconstructions of the cervical spine were also generated. COMPARISON:  Brain MR dated 04/08/2019. Head CT dated 04/20/2015. Cervical spine CT dated 06/05/2012 FINDINGS: CT HEAD FINDINGS Brain: Stable moderately enlarged ventricles and subarachnoid spaces. Mild-to-moderate patchy white matter low density in both cerebral hemispheres without significant change. No intracranial hemorrhage, mass lesion or CT evidence of acute infarction. Vascular: No hyperdense vessel or unexpected calcification. Skull: Normal. Negative for fracture or focal lesion. Sinuses/Orbits: Status post bilateral cataract extraction. Mild left ethmoid sinus mucosal  thickening. Other: None. CT CERVICAL SPINE FINDINGS Alignment: Normal. Skull base and vertebrae: No acute fracture. No primary bone lesion or focal pathologic process. Soft tissues and spinal canal: No prevertebral fluid or swelling. No visible canal hematoma. Disc levels:  Mild multilevel degenerative changes. Upper chest: Unremarkable. Other: None. IMPRESSION: 1. No skull fracture or intracranial hemorrhage. 2. No cervical spine fracture or subluxation. 3. Stable moderate diffuse cerebral and cerebellar atrophy and mild-to-moderate chronic small vessel white matter ischemic changes in both cerebral hemispheres. 4. Mild cervical spine degenerative changes. Electronically Signed   By: Claudie Revering M.D.   On: 07/05/2019 20:56   Dg Hip Unilat With Pelvis 2-3 Views Left  Result Date: 07/05/2019 CLINICAL DATA:  85 year old female with suspected left hip facture after a fall. Left hip pain. EXAM: DG HIP (WITH OR WITHOUT PELVIS) 2-3V LEFT COMPARISON:  No priors. FINDINGS: There is an acute mildly displaced mildly comminuted intertrochanteric fracture of the left hip which is associated with increased varus angulation. There is also likely some rotation of the femoral head in the acetabulum causing distortion of the inter trochanteric regions. Femoral head remains located. Bony pelvis and visualized portions of the right proximal femur appear intact. IMPRESSION: 1. Acute mildly comminuted, displaced and angulated intertrochanteric fracture of the left hip. Electronically Signed   By: Vinnie Langton M.D.   On: 07/05/2019 20:43    ekg nsr at 75, LAD, poor R progression    Assessment & Plan:    Principal Problem:   Intertrochanteric fracture of left femur (HCC) Active Problems:   Pain and swelling of left wrist   Abnormal CXR   Allergic rhinitis  L intertrochanteric hip fracture NPO  Dilaudid 0.5mg  iv q4h prn  Zofran 4mg  iv q6h prn Orthopedic surgery consulted byED, appreciate input Plan is for OR in  am per ED,   L wrist swelling Xray ordered, pt refused  Abnormal chest xray ? CHF, clinically not dyspneic Tele  Check BNP Check trop  Check cardiac echo  Hypertension Currently not on bp medication per son Cont aspirin.   Insomnia at times Remeron 7.5mg  po qhs, prn insomnia  Hx of Allerghic rhinitis Cont benadryl prn    DVT Prophylaxis-   - SCDs , no lovenox since to OR in AM Defer to orthopedics regarding DVT prophylaxis  AM Labs Ordered, also please review Full Orders  Family Communication: Admission, patients condition and plan of care including tests being ordered have been discussed with the patient  who indicate understanding and agree with the plan and Code Status.  Code Status:  FULL CODE per pt, son present at bedside  Admission status: Observation/Inpatient: Based on patients clinical presentation and evaluation of above clinical data, I have made determination that patient meets  Inpatient criteria at this time. Pt will be admitted for L intertrochanteric, fracture for surgery, pt has high risk of clinical deterioration and will require close monitoring. Pt will require > 2 nites stay.   Time spent in minutes : 70   Jani Gravel M.D on 07/06/2019 at 1:07 AM

## 2019-07-06 ENCOUNTER — Encounter (HOSPITAL_COMMUNITY): Payer: Self-pay

## 2019-07-06 ENCOUNTER — Inpatient Hospital Stay (HOSPITAL_COMMUNITY): Payer: Medicare Other

## 2019-07-06 ENCOUNTER — Inpatient Hospital Stay (HOSPITAL_COMMUNITY): Payer: Medicare Other | Admitting: Anesthesiology

## 2019-07-06 ENCOUNTER — Encounter (HOSPITAL_COMMUNITY)
Admission: EM | Disposition: A | Discharge status: Skilled Nursing Facility | Payer: Self-pay | Source: Home / Self Care | Attending: Internal Medicine

## 2019-07-06 DIAGNOSIS — M25532 Pain in left wrist: Secondary | ICD-10-CM

## 2019-07-06 DIAGNOSIS — M25432 Effusion, left wrist: Secondary | ICD-10-CM | POA: Diagnosis present

## 2019-07-06 DIAGNOSIS — J309 Allergic rhinitis, unspecified: Secondary | ICD-10-CM | POA: Diagnosis present

## 2019-07-06 DIAGNOSIS — I509 Heart failure, unspecified: Secondary | ICD-10-CM

## 2019-07-06 DIAGNOSIS — R9389 Abnormal findings on diagnostic imaging of other specified body structures: Secondary | ICD-10-CM

## 2019-07-06 HISTORY — PX: FEMUR IM NAIL: SHX1597

## 2019-07-06 LAB — CBC
HCT: 38.4 % (ref 36.0–46.0)
Hemoglobin: 12.8 g/dL (ref 12.0–15.0)
MCH: 31.4 pg (ref 26.0–34.0)
MCHC: 33.3 g/dL (ref 30.0–36.0)
MCV: 94.1 fL (ref 80.0–100.0)
Platelets: 190 10*3/uL (ref 150–400)
RBC: 4.08 MIL/uL (ref 3.87–5.11)
RDW: 13.4 % (ref 11.5–15.5)
WBC: 12.3 10*3/uL — ABNORMAL HIGH (ref 4.0–10.5)
nRBC: 0 % (ref 0.0–0.2)

## 2019-07-06 LAB — COMPREHENSIVE METABOLIC PANEL
ALT: 12 U/L (ref 0–44)
AST: 18 U/L (ref 15–41)
Albumin: 2.9 g/dL — ABNORMAL LOW (ref 3.5–5.0)
Alkaline Phosphatase: 73 U/L (ref 38–126)
Anion gap: 8 (ref 5–15)
BUN: 15 mg/dL (ref 8–23)
CO2: 24 mmol/L (ref 22–32)
Calcium: 8.7 mg/dL — ABNORMAL LOW (ref 8.9–10.3)
Chloride: 106 mmol/L (ref 98–111)
Creatinine, Ser: 0.52 mg/dL (ref 0.44–1.00)
GFR calc Af Amer: >60 mL/min (ref >60)
GFR calc non Af Amer: >60 mL/min (ref >60)
Glucose, Bld: 118 mg/dL — ABNORMAL HIGH (ref 70–99)
Potassium: 3.8 mmol/L (ref 3.5–5.1)
Sodium: 138 mmol/L (ref 135–145)
Total Bilirubin: 0.7 mg/dL (ref 0.3–1.2)
Total Protein: 5.7 g/dL — ABNORMAL LOW (ref 6.5–8.1)

## 2019-07-06 LAB — ECHOCARDIOGRAM COMPLETE
Height: 62 in
Weight: 2733.7 oz

## 2019-07-06 LAB — ABO/RH: ABO/RH(D): A POS

## 2019-07-06 LAB — SURGICAL PCR SCREEN
MRSA, PCR: NEGATIVE
Staphylococcus aureus: NEGATIVE

## 2019-07-06 LAB — BRAIN NATRIURETIC PEPTIDE: B Natriuretic Peptide: 214.6 pg/mL — ABNORMAL HIGH (ref 0.0–100.0)

## 2019-07-06 LAB — TROPONIN I (HIGH SENSITIVITY): Troponin I (High Sensitivity): 4 ng/L (ref <18)

## 2019-07-06 LAB — TSH: TSH: 2.729 u[IU]/mL (ref 0.350–4.500)

## 2019-07-06 SURGERY — INSERTION, INTRAMEDULLARY ROD, FEMUR
Anesthesia: General | Site: Leg Upper | Laterality: Left

## 2019-07-06 MED ORDER — ACETAMINOPHEN 325 MG PO TABS: 325.0000 mg | ORAL_TABLET | Freq: Four times a day (QID) | ORAL | Status: DC | PRN

## 2019-07-06 MED ORDER — METOCLOPRAMIDE HCL 5 MG/ML IJ SOLN: 5.0000 mg | Freq: Three times a day (TID) | INTRAMUSCULAR | Status: DC | PRN

## 2019-07-06 MED ORDER — ENOXAPARIN SODIUM 40 MG/0.4ML ~~LOC~~ SOLN
40.0000 mg | SUBCUTANEOUS | Status: DC
  Administered 2019-07-07 – 2019-07-11 (×5): 40 mg via SUBCUTANEOUS
  Filled 2019-07-06 (×5): qty 0.4

## 2019-07-06 MED ORDER — POVIDONE-IODINE 10 % EX SWAB: 2.0000 "application " | Freq: Once | CUTANEOUS | Status: DC

## 2019-07-06 MED ORDER — SODIUM CHLORIDE 0.9% FLUSH: 3.0000 mL | Freq: Two times a day (BID) | INTRAVENOUS | Status: DC

## 2019-07-06 MED ORDER — FENTANYL CITRATE (PF) 100 MCG/2ML IJ SOLN
25.0000 ug | INTRAMUSCULAR | Status: DC | PRN
  Administered 2019-07-06: 25 ug via INTRAVENOUS

## 2019-07-06 MED ORDER — OXYCODONE HCL 5 MG PO TABS: 5.0000 mg | ORAL_TABLET | Freq: Once | ORAL | Status: DC | PRN

## 2019-07-06 MED ORDER — ONDANSETRON HCL 4 MG PO TABS: 4.0000 mg | ORAL_TABLET | Freq: Four times a day (QID) | ORAL | Status: DC | PRN

## 2019-07-06 MED ORDER — ONDANSETRON HCL 4 MG/2ML IJ SOLN: 4.0000 mg | Freq: Once | INTRAMUSCULAR | Status: DC | PRN

## 2019-07-06 MED ORDER — ACETAMINOPHEN 500 MG PO TABS
1000.0000 mg | ORAL_TABLET | Freq: Once | ORAL | Status: AC
  Administered 2019-07-06: 1000 mg via ORAL
  Filled 2019-07-06: qty 2

## 2019-07-06 MED ORDER — FENTANYL CITRATE (PF) 250 MCG/5ML IJ SOLN
INTRAMUSCULAR | Status: AC
  Filled 2019-07-06: qty 5

## 2019-07-06 MED ORDER — LIDOCAINE 2% (20 MG/ML) 5 ML SYRINGE
INTRAMUSCULAR | Status: DC | PRN
  Administered 2019-07-06: 60 mg via INTRAVENOUS

## 2019-07-06 MED ORDER — ROCURONIUM BROMIDE 10 MG/ML (PF) SYRINGE
PREFILLED_SYRINGE | INTRAVENOUS | Status: AC
  Filled 2019-07-06: qty 10

## 2019-07-06 MED ORDER — OXYCODONE HCL 5 MG/5ML PO SOLN: 5.0000 mg | Freq: Once | ORAL | Status: DC | PRN

## 2019-07-06 MED ORDER — ONDANSETRON HCL 4 MG/2ML IJ SOLN
INTRAMUSCULAR | Status: DC | PRN
  Administered 2019-07-06: 4 mg via INTRAVENOUS

## 2019-07-06 MED ORDER — FENTANYL CITRATE (PF) 100 MCG/2ML IJ SOLN
INTRAMUSCULAR | Status: DC | PRN
  Administered 2019-07-06 (×3): 25 ug via INTRAVENOUS

## 2019-07-06 MED ORDER — CHLORHEXIDINE GLUCONATE 4 % EX LIQD
60.0000 mL | Freq: Once | CUTANEOUS | Status: DC
  Filled 2019-07-06: qty 60

## 2019-07-06 MED ORDER — METOCLOPRAMIDE HCL 5 MG PO TABS: 5.0000 mg | ORAL_TABLET | Freq: Three times a day (TID) | ORAL | Status: DC | PRN

## 2019-07-06 MED ORDER — CLINDAMYCIN PHOSPHATE 900 MG/50ML IV SOLN
900.0000 mg | INTRAVENOUS | Status: AC
  Administered 2019-07-06: 15:00:00 900 mg via INTRAVENOUS
  Filled 2019-07-06 (×2): qty 50

## 2019-07-06 MED ORDER — LIDOCAINE 2% (20 MG/ML) 5 ML SYRINGE
INTRAMUSCULAR | Status: AC
  Filled 2019-07-06: qty 5

## 2019-07-06 MED ORDER — ENSURE PRE-SURGERY PO LIQD
296.0000 mL | Freq: Once | ORAL | Status: DC
  Filled 2019-07-06: qty 296

## 2019-07-06 MED ORDER — SODIUM CHLORIDE 0.9 % IR SOLN
Status: DC | PRN
  Administered 2019-07-06: 1000 mL

## 2019-07-06 MED ORDER — LACTATED RINGERS IV SOLN
INTRAVENOUS | Status: DC
  Administered 2019-07-06: 15:00:00 via INTRAVENOUS

## 2019-07-06 MED ORDER — PHENYLEPHRINE HCL (PRESSORS) 10 MG/ML IV SOLN
INTRAVENOUS | Status: DC | PRN
  Administered 2019-07-06 (×2): 40 ug via INTRAVENOUS
  Administered 2019-07-06: 120 ug via INTRAVENOUS
  Administered 2019-07-06: 40 ug via INTRAVENOUS

## 2019-07-06 MED ORDER — BUPIVACAINE HCL (PF) 0.25 % IJ SOLN
INTRAMUSCULAR | Status: AC
  Filled 2019-07-06: qty 30

## 2019-07-06 MED ORDER — CLINDAMYCIN PHOSPHATE 600 MG/50ML IV SOLN
600.0000 mg | Freq: Four times a day (QID) | INTRAVENOUS | Status: AC
  Administered 2019-07-06 – 2019-07-07 (×2): 600 mg via INTRAVENOUS
  Filled 2019-07-06 (×2): qty 50

## 2019-07-06 MED ORDER — PROPOFOL 10 MG/ML IV BOLUS
INTRAVENOUS | Status: AC
  Filled 2019-07-06: qty 20

## 2019-07-06 MED ORDER — HYDROMORPHONE HCL 1 MG/ML IJ SOLN
0.2500 mg | INTRAMUSCULAR | Status: DC | PRN
  Administered 2019-07-08 – 2019-07-11 (×5): 0.5 mg via INTRAVENOUS
  Filled 2019-07-06 (×5): qty 1

## 2019-07-06 MED ORDER — ENOXAPARIN SODIUM 40 MG/0.4ML ~~LOC~~ SOLN: 40.0000 mg | SUBCUTANEOUS | 0 refills | Status: DC

## 2019-07-06 MED ORDER — BUPIVACAINE HCL 0.25 % IJ SOLN
INTRAMUSCULAR | Status: DC | PRN
  Administered 2019-07-06: 20 mL

## 2019-07-06 MED ORDER — PROPOFOL 500 MG/50ML IV EMUL
INTRAVENOUS | Status: DC | PRN
  Administered 2019-07-06: 20 ug/kg/min via INTRAVENOUS

## 2019-07-06 MED ORDER — PROPOFOL 10 MG/ML IV BOLUS
INTRAVENOUS | Status: DC | PRN
  Administered 2019-07-06: 70 mg via INTRAVENOUS

## 2019-07-06 MED ORDER — ACETAMINOPHEN 500 MG PO TABS
500.0000 mg | ORAL_TABLET | Freq: Four times a day (QID) | ORAL | Status: AC
  Administered 2019-07-06 – 2019-07-07 (×3): 500 mg via ORAL
  Filled 2019-07-06 (×4): qty 1

## 2019-07-06 MED ORDER — SODIUM CHLORIDE 0.9 % IV SOLN
INTRAVENOUS | Status: DC | PRN
  Administered 2019-07-06: 25 ug/min via INTRAVENOUS

## 2019-07-06 MED ORDER — FENTANYL CITRATE (PF) 100 MCG/2ML IJ SOLN
INTRAMUSCULAR | Status: AC
  Filled 2019-07-06: qty 2

## 2019-07-06 MED ORDER — STERILE WATER FOR IRRIGATION IR SOLN
Status: DC | PRN
  Administered 2019-07-06: 1000 mL

## 2019-07-06 MED ORDER — ONDANSETRON HCL 4 MG/2ML IJ SOLN: 4.0000 mg | Freq: Four times a day (QID) | INTRAMUSCULAR | Status: DC | PRN

## 2019-07-06 MED ORDER — TRANEXAMIC ACID-NACL 1000-0.7 MG/100ML-% IV SOLN: 1000.0000 mg | INTRAVENOUS | Status: DC

## 2019-07-06 MED ORDER — HYDROCODONE-ACETAMINOPHEN 5-325 MG PO TABS: 1.0000 | ORAL_TABLET | Freq: Four times a day (QID) | ORAL | 0 refills | Status: AC | PRN

## 2019-07-06 MED ORDER — ENOXAPARIN SODIUM 30 MG/0.3ML ~~LOC~~ SOLN: 30.0000 mg | SUBCUTANEOUS | 0 refills | Status: DC

## 2019-07-06 MED ORDER — DIPHENHYDRAMINE HCL 25 MG PO TABS
25.0000 mg | ORAL_TABLET | Freq: Four times a day (QID) | ORAL | Status: DC | PRN
  Filled 2019-07-06: qty 1

## 2019-07-06 MED ORDER — HYDROCODONE-ACETAMINOPHEN 5-325 MG PO TABS
1.0000 | ORAL_TABLET | ORAL | Status: DC | PRN
  Administered 2019-07-06 – 2019-07-11 (×12): 1 via ORAL
  Filled 2019-07-06 (×14): qty 1

## 2019-07-06 SURGICAL SUPPLY — 40 items
BIT DRILL AO GAMMA 4.2X180 (BIT) ×2 IMPLANT
CHLORAPREP W/TINT 26 (MISCELLANEOUS) ×2 IMPLANT
CLOSURE STERI-STRIP 1/2X4 (GAUZE/BANDAGES/DRESSINGS) ×1
CLOSURE WOUND 1/2 X4 (GAUZE/BANDAGES/DRESSINGS) ×1
CLSR STERI-STRIP ANTIMIC 1/2X4 (GAUZE/BANDAGES/DRESSINGS) ×2 IMPLANT
COVER MAYO STAND STRL (DRAPES) ×3 IMPLANT
COVER PERINEAL POST (MISCELLANEOUS) ×3 IMPLANT
COVER SURGICAL LIGHT HANDLE (MISCELLANEOUS) ×3 IMPLANT
COVER WAND RF STERILE (DRAPES) ×3 IMPLANT
DRAPE STERI IOBAN 125X83 (DRAPES) ×3 IMPLANT
DRSG MEPILEX BORDER 4X4 (GAUZE/BANDAGES/DRESSINGS) ×9 IMPLANT
DURAPREP 26ML APPLICATOR (WOUND CARE) ×1 IMPLANT
ELECT REM PT RETURN 9FT ADLT (ELECTROSURGICAL) ×3
ELECTRODE REM PT RTRN 9FT ADLT (ELECTROSURGICAL) ×1 IMPLANT
GLOVE BIO SURGEON STRL SZ7.5 (GLOVE) ×6 IMPLANT
GLOVE BIOGEL PI IND STRL 8 (GLOVE) ×2 IMPLANT
GLOVE BIOGEL PI INDICATOR 8 (GLOVE) ×4
GOWN STRL REUS W/ TWL LRG LVL3 (GOWN DISPOSABLE) ×2 IMPLANT
GOWN STRL REUS W/TWL LRG LVL3 (GOWN DISPOSABLE) ×4
GUIDEROD T2 3X1000 (ROD) ×2 IMPLANT
K-WIRE  3.2X450M STR (WIRE) ×2
K-WIRE 3.2X450M STR (WIRE) ×1
KIT BASIN OR (CUSTOM PROCEDURE TRAY) ×3 IMPLANT
KIT TURNOVER KIT B (KITS) ×3 IMPLANT
KWIRE 3.2X450M STR (WIRE) IMPLANT
MANIFOLD NEPTUNE II (INSTRUMENTS) ×1 IMPLANT
NAIL LONG RT 1.5 10X340-125 (Nail) ×2 IMPLANT
NS IRRIG 1000ML POUR BTL (IV SOLUTION) ×3 IMPLANT
PACK GENERAL/GYN (CUSTOM PROCEDURE TRAY) ×3 IMPLANT
PAD ARMBOARD 7.5X6 YLW CONV (MISCELLANEOUS) ×6 IMPLANT
SCREW LAG GAMMA 3 TI 10.5X100M (Screw) ×2 IMPLANT
SCREW LOCKING T2 F/T  5X42.5MM (Screw) ×2 IMPLANT
SCREW LOCKING T2 F/T 5X42.5MM (Screw) IMPLANT
STRIP CLOSURE SKIN 1/2X4 (GAUZE/BANDAGES/DRESSINGS) ×2 IMPLANT
SUT MNCRL AB 4-0 PS2 18 (SUTURE) ×3 IMPLANT
SUT VIC AB 2-0 CT1 27 (SUTURE) ×2
SUT VIC AB 2-0 CT1 TAPERPNT 27 (SUTURE) ×1 IMPLANT
TOWEL GREEN STERILE (TOWEL DISPOSABLE) ×3 IMPLANT
TOWEL OR NON WOVEN STRL DISP B (DISPOSABLE) ×3 IMPLANT
WATER STERILE IRR 1000ML POUR (IV SOLUTION) ×3 IMPLANT

## 2019-07-06 NOTE — ED Notes (Signed)
RN notified by rad tech that pt refused x-ray of wrist. Pt stated she did not have pain in that area therefore did not feel that she needed that done. Rad tech notified Rn that she spoke with Dr. Maudie Mercury who is ok with pt not getting x-ray.

## 2019-07-06 NOTE — Op Note (Signed)
DATE OF SURGERY:  07/06/2019  TIME: 3:23 PM  PATIENT NAME:  Lauren Moreno  AGE: 85 y.o.  PRE-OPERATIVE DIAGNOSIS:  LEFT HIP FRACTURE  POST-OPERATIVE DIAGNOSIS:  SAME  PROCEDURE:  INTRAMEDULLARY (IM) NAIL LEFT HIP  SURGEON:  Renette Butters  ASSISTANT:  Roxan Hockey, PA-C, he was present and scrubbed throughout the case, critical for completion in a timely fashion, and for retraction, instrumentation, and closure.   OPERATIVE IMPLANTS: [employer] Gamma Nail  PREOPERATIVE INDICATIONS:  [name] is a 85 y.o. year old who fell and suffered a hip fracture. She was brought into the ER and then admitted and optimized and then elected for surgical intervention.    The risks benefits and alternatives were discussed with the patient including but not limited to the risks of nonoperative treatment, versus surgical intervention including infection, bleeding, nerve injury, malunion, nonunion, hardware prominence, hardware failure, need for hardware removal, blood clots, cardiopulmonary complications, morbidity, mortality, among others, and they were willing to proceed.    OPERATIVE PROCEDURE:  The patient was brought to the operating room and placed in the supine position. General anesthesia was administered. She was placed on the fracture table.  Closed reduction was performed under C-arm guidance. Time out was then performed after sterile prep and drape. She received preoperative antibiotics.  Incision was made proximal to the greater trochanter. A guidewire was placed in the appropriate position. Confirmation was made on AP and lateral views. The above-named nail was opened. I opened the proximal femur with a reamer. I then placed the nail by hand easily down. I did not need to ream the femur.  Once the nail was completely seated, I placed a guidepin into the femoral head into the center center position. I measured the length, and then reamed the lateral cortex and up into the head.  I then placed the lag screw. Slight compression was applied. Anatomic fixation achieved. Bone quality was mediocre.  I then secured the proximal interlocking bolt, and took off a half a turn, and then removed the instruments, and took final C-arm pictures AP and lateral the entire length of the leg.  I then used perfect circles technique to place a distal interlock screw.   Anatomic reconstruction was achieved, and the wounds were irrigated copiously and closed with Vicryl followed by staples and sterile gauze for the skin. The patient was awakened and returned to PACU in stable and satisfactory condition. There no complications and the patient tolerated the procedure well.  She will be weightbearing as tolerated, and will be on chemical px  for a period of four weeks after discharge.   [name], M.D.

## 2019-07-06 NOTE — Progress Notes (Signed)
PROGRESS NOTE  Lauren Moreno [id number] DOB: 1928-01-04 DOA: 07/05/2019 PCP: Philmore Pali, NP  Brief History   Armiyah Almeda  is a 85 y.o. female, w hypertension, hyperlipidemia, thyroid disease, apparently sufferred mechanical fall and c/o left hip pain.  Pt apparently fell backwards, and hit her head, There was no syncope per her family.   In the ED the patient was found to have an acute mildly comminuted displaced and angulated intertrochanteric fracture of the left hip. CT of the head demonstrated no skull fracture or intracranial hemorrhage, no c-spine fracture or subluxation, and stable moderate diffuse cerebral and cerebellar atrophy and mild -moderate chronic small vessel white matter ischemic changes in both cerebral hemispheres. Mild cervical spine degenerative changes. CXR demonstrated minimal changes of congestive heart failure with chronic interstitial lung disease.   The patient  Underwent ORIF of her left hip on 07/06/2019. She has tolerated the procedure well.  Consultants  . Orthopedic surgery  Procedures  . ORIF left hip  Antibiotics   Anti-infectives (From admission, onward)   Start     Dose/Rate Route Frequency Ordered Stop   07/06/19 0900  clindamycin (CLEOCIN) IVPB 900 mg     900 mg 100 mL/hr over 30 Minutes Intravenous To Short Stay 07/06/19 0840 07/06/19 1557    .   Subjective  The patient is complaining of pain in her left leg. No new complaints.  Objective   Vitals:  Vitals:   07/06/19 0729 07/06/19 1149  BP: (!) 151/46 125/70  Pulse: 88 95  Resp: 16 15  Temp: 98.1 F (36.7 C) 98.6 F (37 C)  SpO2: 96% 94%   Exam:  Constitutional:  . The patient is awake, alert, and oriented x 3. No acute distress. Marland Kitchen  Respiratory:  . No increased work of breathing. . No wheezes, rales, or rhonchi . No tactile fremitus Cardiovascular:  . Regular rate and rhythm . No murmurs, ectopy, or gallups. . No lateral PMI. No thrills. Abdomen:  . Abdomen  is soft, non-tender, non-distended . No hernias, masses, or organomegaly . Normoactive bowel sounds.  Musculoskeletal:  . No cyanosis, clubbing, or edema Skin:  . No rashes, lesions, ulcers . palpation of skin: no induration or nodules Neurologic:  . CN 2-12 intact . Sensation all 4 extremities intact Psychiatric:  . Mental status o Mood, affect appropriate o Orientation to person, place, time  . judgment and insight appear intact  I have personally reviewed the following:   Today's Data  . Vitals, CMP, CBC, BNP, Troponin  Scheduled Meds: . [MAR Hold] aspirin EC  81 mg Oral q morning - 10a  . chlorhexidine  60 mL Topical Once  . feeding supplement  296 mL Oral Once  . povidone-iodine  2 application Topical Once  . [MAR Hold] sodium chloride flush  3 mL Intravenous Q12H   Continuous Infusions: . [MAR Hold] sodium chloride    . lactated ringers 10 mL/hr at 07/06/19 1432  . tranexamic acid      Principal Problem:   Intertrochanteric fracture of left femur (HCC) Active Problems:   Pain and swelling of left wrist   Abnormal CXR   Allergic rhinitis   LOS: 1 day   A & P  L intertrochanteric hip fracture: S/P ORIF of left hip for today. ,   L wrist swelling: Xray ordered, pt refused. Appears improved.  Evidence of CHF on CXR: BNP mildly increased. No respiratory distress. No history of CHF. Echocardiogram was performed today that demonstrated  [id number] with diastolic dysfunction. The patient appears well compenstated. Monitor volume status. Troponin negative.  Hypertension: Blood pressures are well controlled. She is on no antihypertensives at home.   Insomnia at times: Continue Remeron 7.5mg  po qhs, prn insomnia.  Hx of Allerghic rhinitis: Cont benadryl prn   DVT Prophylaxis:  SCDs , no lovenox since to OR in AM. Defer to orthopedics regarding DVT prophylaxis. Family Communication:None available. Code Status:  FULL CODE per pt, son present at bedside  Disposition: TBD  Camaron Cammack, DO [facility] Direct contact: see [email or web address]  7PM-7AM contact night coverage as above 07/06/2019, 4:09 PM  LOS: 1 day

## 2019-07-06 NOTE — Consult Note (Signed)
ORTHOPAEDIC CONSULTATION  REQUESTING PHYSICIAN: 66, Ava, DO  Chief Complaint: Fall, left hip pain  HPI: Lauren Moreno is a 85 y.o. female who complains of left hip pain after fall yesterday.  She was unable to bear weight.  She presented to the emergency department where x-rays showed a comminuted and displaced left intertrochanteric femur fracture.  Orthopedics was consulted for evaluation.  This morning her pain is controlled but significant with movement.  She has trouble with recall, notes being on a heart monitor over the past week.  Denies history of heart attack.  Unsure about previous DVT/PE history.  She lives alone in [place].     Past Medical History:  Diagnosis Date  . Hyperlipidemia   . Hypertension   . Thyroid disease    Past Surgical History:  Procedure Laterality Date  . APPENDECTOMY    . FRACTURE SURGERY    . HEMORROIDECTOMY    . left arm fracture    . ORIF TIBIA & FIBULA FRACTURES     right leg  . PARTIAL HYSTERECTOMY    . SHOULDER ARTHROSCOPY     left  . TONSILLECTOMY AND ADENOIDECTOMY     Social History   Socioeconomic History  . Marital status: Married    Spouse name: Not on file  . Number of children: 4  . Years of education: Not on file  . Highest education level: Not on file  Occupational History  . Not on file  Social Needs  . Financial resource strain: Not on file  . Food insecurity    Worry: Not on file    Inability: Not on file  . Transportation needs    Medical: Not on file    Non-medical: Not on file  Tobacco Use  . Smoking status: Never Smoker  . Smokeless tobacco: Never Used  Substance and Sexual Activity  . Alcohol use: No  . Drug use: No  . Sexual activity: Not on file  Lifestyle  . Physical activity    Days per week: Not on file    Minutes per session: Not on file  . Stress: Not on file  Relationships  . Social Herbalist on phone: Not on file    Gets together: Not on file    Attends religious  service: Not on file    Active member of club or organization: Not on file    Attends meetings of clubs or organizations: Not on file    Relationship status: Not on file  Other Topics Concern  . Not on file  Social History Narrative  . Not on file   Family History  Problem Relation Age of Onset  . Cancer Other    Allergies  Allergen Reactions  . Penicillins Anaphylaxis    Did it involve swelling of the face/tongue/throat, SOB, or low BP? Yes Did it involve sudden or severe rash/hives, skin peeling, or any reaction on the inside of your mouth or nose? No Did you need to seek medical attention at a hospital or doctor's office? Yes When did it last happen?young adult or middle aged adult If all above answers are "NO", may proceed with cephalosporin use.   Marland Kitchen Shrimp [Shellfish Allergy] Anaphylaxis   Prior to Admission medications   Medication Sig Start Date End Date Taking? Authorizing Provider  acetaminophen (TYLENOL) 500 MG tablet Take 1,000 mg by mouth every 6 (six) hours as needed for moderate pain.   Yes [provider]  aspirin EC  81 MG tablet Take 81 mg by mouth every morning.    Yes [provider]  diphenhydrAMINE (BENADRYL) 25 MG tablet Take 25 mg by mouth every 6 (six) hours as needed for allergies.   Yes [provider]  Multiple Vitamin (MULTIVITAMIN WITH MINERALS) TABS tablet Take 1 tablet by mouth every morning.    Yes [provider]  nitroGLYCERIN (NITROSTAT) 0.4 MG SL tablet Place 0.4 mg under the tongue every 5 (five) minutes as needed for chest pain (3 doses max).    Yes [provider]   Dg Chest 1 View  Result Date: 07/05/2019 CLINICAL DATA:  Left hip pain following a fall. EXAM: CHEST  1 VIEW COMPARISON:  04/08/2019 FINDINGS: Normal sized heart. Tortuous aorta. Clear lungs. Mild increase in prominence of the interstitial markings with some Kerley lines on the left. Decreased prominence of the vasculature. Diffuse  osteopenia. Electronic devices overlying the left shoulder and left neck. IMPRESSION: Minimal changes of congestive heart failure superimposed on chronic interstitial lung disease. Electronically Signed   By: Claudie Revering M.D.   On: 07/05/2019 20:46   Dg Knee 2 Views Left  Result Date: 07/05/2019 CLINICAL DATA:  Left hip pain following a fall. EXAM: LEFT KNEE - 1-2 VIEW COMPARISON:  None. FINDINGS: Limited examination due to obliquity on the lateral view, distortion on the frontal view due to the fact that was obtained cross-table and no oblique views. No gross fracture or dislocation and no gross effusion seen. IMPRESSION: Limited examination demonstrating no gross fracture or effusion. Electronically Signed   By: Claudie Revering M.D.   On: 07/05/2019 20:44   Ct Head Wo Contrast  Result Date: 07/05/2019 CLINICAL DATA:  Neck pain after falling and hitting the back her head on kitchen cabinets tonight. EXAM: CT HEAD WITHOUT CONTRAST CT CERVICAL SPINE WITHOUT CONTRAST TECHNIQUE: Multidetector CT imaging of the head and cervical spine was performed following the standard protocol without intravenous contrast. Multiplanar CT image reconstructions of the cervical spine were also generated. COMPARISON:  Brain MR dated 04/08/2019. Head CT dated 04/20/2015. Cervical spine CT dated 06/05/2012 FINDINGS: CT HEAD FINDINGS Brain: Stable moderately enlarged ventricles and subarachnoid spaces. Mild-to-moderate patchy white matter low density in both cerebral hemispheres without significant change. No intracranial hemorrhage, mass lesion or CT evidence of acute infarction. Vascular: No hyperdense vessel or unexpected calcification. Skull: Normal. Negative for fracture or focal lesion. Sinuses/Orbits: Status post bilateral cataract extraction. Mild left ethmoid sinus mucosal thickening. Other: None. CT CERVICAL SPINE FINDINGS Alignment: Normal. Skull base and vertebrae: No acute fracture. No primary bone lesion or focal  pathologic process. Soft tissues and spinal canal: No prevertebral fluid or swelling. No visible canal hematoma. Disc levels:  Mild multilevel degenerative changes. Upper chest: Unremarkable. Other: None. IMPRESSION: 1. No skull fracture or intracranial hemorrhage. 2. No cervical spine fracture or subluxation. 3. Stable moderate diffuse cerebral and cerebellar atrophy and mild-to-moderate chronic small vessel white matter ischemic changes in both cerebral hemispheres. 4. Mild cervical spine degenerative changes. Electronically Signed   By: Claudie Revering M.D.   On: 07/05/2019 20:56   Ct Cervical Spine Wo Contrast  Result Date: 07/05/2019 CLINICAL DATA:  Neck pain after falling and hitting the back her head on kitchen cabinets tonight. EXAM: CT HEAD WITHOUT CONTRAST CT CERVICAL SPINE WITHOUT CONTRAST TECHNIQUE: Multidetector CT imaging of the head and cervical spine was performed following the standard protocol without intravenous contrast. Multiplanar CT image reconstructions of the cervical spine were also generated. COMPARISON:  Brain MR dated 04/08/2019. Head CT dated 04/20/2015. Cervical spine CT dated 06/05/2012 FINDINGS: CT HEAD FINDINGS Brain: Stable moderately enlarged ventricles and subarachnoid spaces. Mild-to-moderate patchy white matter low density in both cerebral hemispheres without significant change. No intracranial hemorrhage, mass lesion or CT evidence of acute infarction. Vascular: No hyperdense vessel or unexpected calcification. Skull: Normal. Negative for fracture or focal lesion. Sinuses/Orbits: Status post bilateral cataract extraction. Mild left ethmoid sinus mucosal thickening. Other: None. CT CERVICAL SPINE FINDINGS Alignment: Normal. Skull base and vertebrae: No acute fracture. No primary bone lesion or focal pathologic process. Soft tissues and spinal canal: No prevertebral fluid or swelling. No visible canal hematoma. Disc levels:  Mild multilevel degenerative changes. Upper chest:  Unremarkable. Other: None. IMPRESSION: 1. No skull fracture or intracranial hemorrhage. 2. No cervical spine fracture or subluxation. 3. Stable moderate diffuse cerebral and cerebellar atrophy and mild-to-moderate chronic small vessel white matter ischemic changes in both cerebral hemispheres. 4. Mild cervical spine degenerative changes. Electronically Signed   By: Claudie Revering M.D.   On: 07/05/2019 20:56   Dg Hip Unilat With Pelvis 2-3 Views Left  Result Date: 07/05/2019 CLINICAL DATA:  85 year old female with suspected left hip facture after a fall. Left hip pain. EXAM: DG HIP (WITH OR WITHOUT PELVIS) 2-3V LEFT COMPARISON:  No priors. FINDINGS: There is an acute mildly displaced mildly comminuted intertrochanteric fracture of the left hip which is associated with increased varus angulation. There is also likely some rotation of the femoral head in the acetabulum causing distortion of the inter trochanteric regions. Femoral head remains located. Bony pelvis and visualized portions of the right proximal femur appear intact. IMPRESSION: 1. Acute mildly comminuted, displaced and angulated intertrochanteric fracture of the left hip. Electronically Signed   By: Vinnie Langton M.D.   On: 07/05/2019 20:43    Positive ROS: All other systems have been reviewed and were otherwise negative with the exception of those mentioned in the HPI and as above.  Objective: Labs cbc Recent Labs    07/05/19 2123  WBC 14.3*  HGB 13.9  HCT 41.9  PLT 189    Labs inflam No results for input(s): CRP in the last 72 hours.  Invalid input(s): ESR  Labs coag Recent Labs    07/05/19 2123  INR 1.0    Recent Labs    07/05/19 2123  NA 136  K 3.6  CL 101  CO2 23  GLUCOSE 121*  BUN 17  CREATININE 0.80  CALCIUM 8.7*    Physical Exam: Vitals:   07/06/19 0132 07/06/19 0502  BP: (!) 148/68 140/78  Pulse: 73 84  Resp: 18 18  Temp: 98.1 F (36.7 C) 98.2 F (36.8 C)  SpO2: 95% 96%   General: Alert, no  acute distress.  Supine in bed.  Calm, interactive and responds appropriately to questions but has has trouble with recall. Mental status: Alert and Oriented x3 Neurologic: Speech Clear and organized, no gross focal findings or movement disorder appreciated. Respiratory: No cyanosis, no use of accessory musculature Cardiovascular: No pedal edema GI: Abdomen is soft and non-tender, non-distended. Skin: Warm and dry.  No lesions in the area of chief complaint. Extremities: Warm and well perfused w/o edema Psychiatric: Patient is competent for consent with normal mood and affect  MUSCULOSKELETAL:  LLE: Pain with range of motion left hip.  Mild tenderness laterally.  No lesions.  Distally she is neurovascular intact. Other extremities are atraumatic with painless ROM and NVI.  Assessment / Plan: Principal Problem:  Intertrochanteric fracture of left femur (HCC) Active Problems:   Pain and swelling of left wrist   Abnormal CXR   Allergic rhinitis   Closed displaced and comminuted left intertrochanteric femur fracture  Plan for Operative fixation today by Dr. Percell Miller -NPO  -Medicine team to admit and perform pre-op clearance -PT/OT post op -Foley okay prn for comfort  -Likely to require Rehab or SNF placement upon discharge.   The risks benefits and alternatives of the procedure were discussed with the patient including but not limited to infection, bleeding, nerve injury, the need for revision surgery, blood clots, cardiopulmonary complications, morbidity, mortality, among others.  The patient verbalizes understanding and wishes to proceed.     Weightbearing: Bedrest.  Will amend post op. VTE prophylaxis:  SCDs.  Will amend postoperatively Pain control:   Minimize narcotics if able. Follow-up plan: Will follow in acute inpatient post-op phase.  Plan for outpatient follow up with Dr. Alain Marion in about 2 weeks. Contact information:  [name] MD, Roxan Hockey PA-C  Prudencio Burly III PA-C 07/06/2019 6:53 AM

## 2019-07-06 NOTE — Anesthesia Postprocedure Evaluation (Signed)
Anesthesia Post Note  Patient: Lauren Hennesy Heavrin  Procedure(s) Performed: INTRAMEDULLARY (IM) NAIL LEFT HIP (Left Leg Upper)     Patient location during evaluation: PACU Anesthesia Type: General Level of consciousness: awake and alert Pain management: pain level controlled Vital Signs Assessment: post-procedure vital signs reviewed and stable Respiratory status: spontaneous breathing, nonlabored ventilation and respiratory function stable Cardiovascular status: blood pressure returned to baseline and stable Postop Assessment: no apparent nausea or vomiting Anesthetic complications: no    Last Vitals:  Vitals:   07/06/19 1700 07/06/19 1730  BP: 118/65 (!) 119/53  Pulse: 85 80  Resp: 12 20  Temp: (!) 36.2 C   SpO2: 100% 98%    Last Pain:  Vitals:   07/06/19 1700  TempSrc:   PainSc: [place]

## 2019-07-06 NOTE — H&P (View-Only) (Signed)
ORTHOPAEDIC CONSULTATION  REQUESTING PHYSICIAN: 66, Ava, DO  Chief Complaint: Fall, left hip pain  HPI: Lauren Moreno is a 85 y.o. female who complains of left hip pain after fall yesterday.  She was unable to bear weight.  She presented to the emergency department where x-rays showed a comminuted and displaced left intertrochanteric femur fracture.  Orthopedics was consulted for evaluation.  This morning her pain is controlled but significant with movement.  She has trouble with recall, notes being on a heart monitor over the past week.  Denies history of heart attack.  Unsure about previous DVT/PE history.  She lives alone in [place].     Past Medical History:  Diagnosis Date  . Hyperlipidemia   . Hypertension   . Thyroid disease    Past Surgical History:  Procedure Laterality Date  . APPENDECTOMY    . FRACTURE SURGERY    . HEMORROIDECTOMY    . left arm fracture    . ORIF TIBIA & FIBULA FRACTURES     right leg  . PARTIAL HYSTERECTOMY    . SHOULDER ARTHROSCOPY     left  . TONSILLECTOMY AND ADENOIDECTOMY     Social History   Socioeconomic History  . Marital status: Married    Spouse name: Not on file  . Number of children: 4  . Years of education: Not on file  . Highest education level: Not on file  Occupational History  . Not on file  Social Needs  . Financial resource strain: Not on file  . Food insecurity    Worry: Not on file    Inability: Not on file  . Transportation needs    Medical: Not on file    Non-medical: Not on file  Tobacco Use  . Smoking status: Never Smoker  . Smokeless tobacco: Never Used  Substance and Sexual Activity  . Alcohol use: No  . Drug use: No  . Sexual activity: Not on file  Lifestyle  . Physical activity    Days per week: Not on file    Minutes per session: Not on file  . Stress: Not on file  Relationships  . Social Herbalist on phone: Not on file    Gets together: Not on file    Attends religious  service: Not on file    Active member of club or organization: Not on file    Attends meetings of clubs or organizations: Not on file    Relationship status: Not on file  Other Topics Concern  . Not on file  Social History Narrative  . Not on file   Family History  Problem Relation Age of Onset  . Cancer Other    Allergies  Allergen Reactions  . Penicillins Anaphylaxis    Did it involve swelling of the face/tongue/throat, SOB, or low BP? Yes Did it involve sudden or severe rash/hives, skin peeling, or any reaction on the inside of your mouth or nose? No Did you need to seek medical attention at a hospital or doctor's office? Yes When did it last happen?young adult or middle aged adult If all above answers are "NO", may proceed with cephalosporin use.   Marland Kitchen Shrimp [Shellfish Allergy] Anaphylaxis   Prior to Admission medications   Medication Sig Start Date End Date Taking? Authorizing Provider  acetaminophen (TYLENOL) 500 MG tablet Take 1,000 mg by mouth every 6 (six) hours as needed for moderate pain.   Yes [provider]  aspirin EC  81 MG tablet Take 81 mg by mouth every morning.    Yes [provider]  diphenhydrAMINE (BENADRYL) 25 MG tablet Take 25 mg by mouth every 6 (six) hours as needed for allergies.   Yes [provider]  Multiple Vitamin (MULTIVITAMIN WITH MINERALS) TABS tablet Take 1 tablet by mouth every morning.    Yes [provider]  nitroGLYCERIN (NITROSTAT) 0.4 MG SL tablet Place 0.4 mg under the tongue every 5 (five) minutes as needed for chest pain (3 doses max).    Yes [provider]   Dg Chest 1 View  Result Date: 07/05/2019 CLINICAL DATA:  Left hip pain following a fall. EXAM: CHEST  1 VIEW COMPARISON:  04/08/2019 FINDINGS: Normal sized heart. Tortuous aorta. Clear lungs. Mild increase in prominence of the interstitial markings with some Kerley lines on the left. Decreased prominence of the vasculature. Diffuse  osteopenia. Electronic devices overlying the left shoulder and left neck. IMPRESSION: Minimal changes of congestive heart failure superimposed on chronic interstitial lung disease. Electronically Signed   By: Claudie Revering M.D.   On: 07/05/2019 20:46   Dg Knee 2 Views Left  Result Date: 07/05/2019 CLINICAL DATA:  Left hip pain following a fall. EXAM: LEFT KNEE - 1-2 VIEW COMPARISON:  None. FINDINGS: Limited examination due to obliquity on the lateral view, distortion on the frontal view due to the fact that was obtained cross-table and no oblique views. No gross fracture or dislocation and no gross effusion seen. IMPRESSION: Limited examination demonstrating no gross fracture or effusion. Electronically Signed   By: Claudie Revering M.D.   On: 07/05/2019 20:44   Ct Head Wo Contrast  Result Date: 07/05/2019 CLINICAL DATA:  Neck pain after falling and hitting the back her head on kitchen cabinets tonight. EXAM: CT HEAD WITHOUT CONTRAST CT CERVICAL SPINE WITHOUT CONTRAST TECHNIQUE: Multidetector CT imaging of the head and cervical spine was performed following the standard protocol without intravenous contrast. Multiplanar CT image reconstructions of the cervical spine were also generated. COMPARISON:  Brain MR dated 04/08/2019. Head CT dated 04/20/2015. Cervical spine CT dated 06/05/2012 FINDINGS: CT HEAD FINDINGS Brain: Stable moderately enlarged ventricles and subarachnoid spaces. Mild-to-moderate patchy white matter low density in both cerebral hemispheres without significant change. No intracranial hemorrhage, mass lesion or CT evidence of acute infarction. Vascular: No hyperdense vessel or unexpected calcification. Skull: Normal. Negative for fracture or focal lesion. Sinuses/Orbits: Status post bilateral cataract extraction. Mild left ethmoid sinus mucosal thickening. Other: None. CT CERVICAL SPINE FINDINGS Alignment: Normal. Skull base and vertebrae: No acute fracture. No primary bone lesion or focal  pathologic process. Soft tissues and spinal canal: No prevertebral fluid or swelling. No visible canal hematoma. Disc levels:  Mild multilevel degenerative changes. Upper chest: Unremarkable. Other: None. IMPRESSION: 1. No skull fracture or intracranial hemorrhage. 2. No cervical spine fracture or subluxation. 3. Stable moderate diffuse cerebral and cerebellar atrophy and mild-to-moderate chronic small vessel white matter ischemic changes in both cerebral hemispheres. 4. Mild cervical spine degenerative changes. Electronically Signed   By: Claudie Revering M.D.   On: 07/05/2019 20:56   Ct Cervical Spine Wo Contrast  Result Date: 07/05/2019 CLINICAL DATA:  Neck pain after falling and hitting the back her head on kitchen cabinets tonight. EXAM: CT HEAD WITHOUT CONTRAST CT CERVICAL SPINE WITHOUT CONTRAST TECHNIQUE: Multidetector CT imaging of the head and cervical spine was performed following the standard protocol without intravenous contrast. Multiplanar CT image reconstructions of the cervical spine were also generated. COMPARISON:  Brain MR dated 04/08/2019. Head CT dated 04/20/2015. Cervical spine CT dated 06/05/2012 FINDINGS: CT HEAD FINDINGS Brain: Stable moderately enlarged ventricles and subarachnoid spaces. Mild-to-moderate patchy white matter low density in both cerebral hemispheres without significant change. No intracranial hemorrhage, mass lesion or CT evidence of acute infarction. Vascular: No hyperdense vessel or unexpected calcification. Skull: Normal. Negative for fracture or focal lesion. Sinuses/Orbits: Status post bilateral cataract extraction. Mild left ethmoid sinus mucosal thickening. Other: None. CT CERVICAL SPINE FINDINGS Alignment: Normal. Skull base and vertebrae: No acute fracture. No primary bone lesion or focal pathologic process. Soft tissues and spinal canal: No prevertebral fluid or swelling. No visible canal hematoma. Disc levels:  Mild multilevel degenerative changes. Upper chest:  Unremarkable. Other: None. IMPRESSION: 1. No skull fracture or intracranial hemorrhage. 2. No cervical spine fracture or subluxation. 3. Stable moderate diffuse cerebral and cerebellar atrophy and mild-to-moderate chronic small vessel white matter ischemic changes in both cerebral hemispheres. 4. Mild cervical spine degenerative changes. Electronically Signed   By: Claudie Revering M.D.   On: 07/05/2019 20:56   Dg Hip Unilat With Pelvis 2-3 Views Left  Result Date: 07/05/2019 CLINICAL DATA:  85 year old female with suspected left hip facture after a fall. Left hip pain. EXAM: DG HIP (WITH OR WITHOUT PELVIS) 2-3V LEFT COMPARISON:  No priors. FINDINGS: There is an acute mildly displaced mildly comminuted intertrochanteric fracture of the left hip which is associated with increased varus angulation. There is also likely some rotation of the femoral head in the acetabulum causing distortion of the inter trochanteric regions. Femoral head remains located. Bony pelvis and visualized portions of the right proximal femur appear intact. IMPRESSION: 1. Acute mildly comminuted, displaced and angulated intertrochanteric fracture of the left hip. Electronically Signed   By: Vinnie Langton M.D.   On: 07/05/2019 20:43    Positive ROS: All other systems have been reviewed and were otherwise negative with the exception of those mentioned in the HPI and as above.  Objective: Labs cbc Recent Labs    07/05/19 2123  WBC 14.3*  HGB 13.9  HCT 41.9  PLT 189    Labs inflam No results for input(s): CRP in the last 72 hours.  Invalid input(s): ESR  Labs coag Recent Labs    07/05/19 2123  INR 1.0    Recent Labs    07/05/19 2123  NA 136  K 3.6  CL 101  CO2 23  GLUCOSE 121*  BUN 17  CREATININE 0.80  CALCIUM 8.7*    Physical Exam: Vitals:   07/06/19 0132 07/06/19 0502  BP: (!) 148/68 140/78  Pulse: 73 84  Resp: 18 18  Temp: 98.1 F (36.7 C) 98.2 F (36.8 C)  SpO2: 95% 96%   General: Alert, no  acute distress.  Supine in bed.  Calm, interactive and responds appropriately to questions but has has trouble with recall. Mental status: Alert and Oriented x3 Neurologic: Speech Clear and organized, no gross focal findings or movement disorder appreciated. Respiratory: No cyanosis, no use of accessory musculature Cardiovascular: No pedal edema GI: Abdomen is soft and non-tender, non-distended. Skin: Warm and dry.  No lesions in the area of chief complaint. Extremities: Warm and well perfused w/o edema Psychiatric: Patient is competent for consent with normal mood and affect  MUSCULOSKELETAL:  LLE: Pain with range of motion left hip.  Mild tenderness laterally.  No lesions.  Distally she is neurovascular intact. Other extremities are atraumatic with painless ROM and NVI.  Assessment / Plan: Principal Problem:  Intertrochanteric fracture of left femur (HCC) Active Problems:   Pain and swelling of left wrist   Abnormal CXR   Allergic rhinitis   Closed displaced and comminuted left intertrochanteric femur fracture  Plan for Operative fixation today by Dr. Percell Miller -NPO  -Medicine team to admit and perform pre-op clearance -PT/OT post op -Foley okay prn for comfort  -Likely to require Rehab or SNF placement upon discharge.   The risks benefits and alternatives of the procedure were discussed with the patient including but not limited to infection, bleeding, nerve injury, the need for revision surgery, blood clots, cardiopulmonary complications, morbidity, mortality, among others.  The patient verbalizes understanding and wishes to proceed.     Weightbearing: Bedrest.  Will amend post op. VTE prophylaxis:  SCDs.  Will amend postoperatively Pain control:   Minimize narcotics if able. Follow-up plan: Will follow in acute inpatient post-op phase.  Plan for outpatient follow up with Dr. Alain Marion in about 2 weeks. Contact information:  [name] MD, Roxan Hockey PA-C  Prudencio Burly III PA-C 07/06/2019 6:53 AM

## 2019-07-06 NOTE — ED Notes (Signed)
ED TO INPATIENT HANDOFF REPORT  ED Nurse Name and Phone #: [phone]  S Name/Age/Gender Lauren Moreno 85 y.o. female Room/Bed: 028C/028C  Code Status   Code Status: Full Code  Home/SNF/Other Home Patient oriented to: self, place, time and situation Is this baseline? Yes   Triage Complete: Triage complete  Chief Complaint Fall; Hip Fx  Triage Note Ems REPORTED pt fell back and hit her head on kitchen cabinets tonight. On arrival Pt's Let leg was shortened and rotated. Pt also had a c collar on for reported neck pain. Pt denies any LOC and is A/O x4 on arrival. EMS vitals : BP 135/72 , P 80 , O2 sats 96 % on room air. CBG 122.  Pt has a HX of A-fib  . Pt is also wearing a Holter monitor for 2 weeks. Pt has monitor on now.   Allergies Allergies  Allergen Reactions  . Penicillins Anaphylaxis    Did it involve swelling of the face/tongue/throat, SOB, or low BP? Yes Did it involve sudden or severe rash/hives, skin peeling, or any reaction on the inside of your mouth or nose? No Did you need to seek medical attention at a hospital or doctor's office? Yes When did it last happen?young adult or middle aged adult If all above answers are "NO", may proceed with cephalosporin use.   Marland Kitchen Shrimp [Shellfish Allergy] Anaphylaxis    Level of Care/Admitting Diagnosis ED Disposition    ED Disposition Condition [facility] Area: [place] [[id number]]  Level of Care: Telemetry Medical [104]  Covid Evaluation: Confirmed COVID Negative  Diagnosis: [facility]trochanteric [facility] left [id number]  Admitting Physician: Jani Gravel [[id number]]  Attending Physician: Jani Gravel [phone]  Estimated length of stay: past midnight tomorrow  Certification:: I certify this patient will need inpatient services for at least 2 midnights  PT Class (Do Not Modify): Inpatient [101]  PT Acc Code (Do Not Modify): Private [1]       B Medical/Surgery History Past  Medical History:  Diagnosis Date  . Hyperlipidemia   . Hypertension   . Thyroid disease    Past Surgical History:  Procedure Laterality Date  . APPENDECTOMY    . FRACTURE SURGERY    . HEMORROIDECTOMY    . left arm fracture    . ORIF TIBIA & FIBULA FRACTURES     right leg  . PARTIAL HYSTERECTOMY    . SHOULDER ARTHROSCOPY     left  . TONSILLECTOMY AND ADENOIDECTOMY       A IV Location/Drains/Wounds Patient Lines/Drains/Airways Status   Active Line/Drains/Airways    Name:   Placement date:   Placement time:   Site:   Days:   Peripheral IV 04/08/19 Left Antecubital   04/08/19    1327    Antecubital   89   External Urinary Catheter   07/05/19    1930    -   1          Intake/Output Last 24 hours No intake or output data in the 24 hours ending 07/06/19 0054  Labs/Imaging Results for orders placed or performed during the hospital encounter of 07/05/19 (from the past 48 hour(s))  Type and screen [place]     Status: None   Collection Time: 07/05/19  9:12 PM  Result Value Ref Range   ABO/RH(D) A POS    Antibody Screen NEG    Sample Expiration      07/08/2019,2359 Performed  at [facility], [place] [address] [place], [place] [address]   ABO/Rh     Status: None (Preliminary result)   Collection Time: 07/05/19  9:12 PM  Result Value Ref Range   ABO/RH(D)      A POS Performed at [facility] [address], [place], [place] [address]   SARS Coronavirus 2 [facility] order, Performed in [facility] hospital lab) Nasopharyngeal Nasopharyngeal Swab     Status: None   Collection Time: 07/05/19  9:15 PM   Specimen: Nasopharyngeal Swab  Result Value Ref Range   SARS Coronavirus 2 NEGATIVE NEGATIVE    Comment: (NOTE) If result is NEGATIVE SARS-CoV-2 target nucleic acids are NOT DETECTED. The SARS-CoV-2 RNA is generally detectable in upper and lower  respiratory specimens during the acute phase of infection. The lowest  concentration of  SARS-CoV-2 viral copies this assay can detect is 250  copies / mL. A negative result does not preclude SARS-CoV-2 infection  and should not be used as the sole basis for treatment or other  patient management decisions.  A negative result may occur with  improper specimen collection / handling, submission of specimen other  than nasopharyngeal swab, presence of viral mutation(s) within the  areas targeted by this assay, and inadequate number of viral copies  (<250 copies / mL). A negative result must be combined with clinical  observations, patient history, and epidemiological information. If result is POSITIVE SARS-CoV-2 target nucleic acids are DETECTED. The SARS-CoV-2 RNA is generally detectable in upper and lower  respiratory specimens dur ing the acute phase of infection.  Positive  results are indicative of active infection with SARS-CoV-2.  Clinical  correlation with patient history and other diagnostic information is  necessary to determine patient infection status.  Positive results do  not rule out bacterial infection or co-infection with other viruses. If result is PRESUMPTIVE POSTIVE SARS-CoV-2 nucleic acids MAY BE PRESENT.   A presumptive positive result was obtained on the submitted specimen  and confirmed on repeat testing.  While 2019 novel coronavirus  (SARS-CoV-2) nucleic acids may be present in the submitted sample  additional confirmatory testing may be necessary for epidemiological  and / or clinical management purposes  to differentiate between  SARS-CoV-2 and other Sarbecovirus currently known to infect humans.  If clinically indicated additional testing with an alternate test  methodology [phone]) is advised. The SARS-CoV-2 RNA is generally  detectable in upper and lower respiratory sp ecimens during the acute  phase of infection. The expected result is Negative. Fact Sheet for Patients:  StrictlyIdeas.no Fact Sheet for Healthcare  Providers: BankingDealers.co.za This test is not yet approved or cleared by the [place] FDA and has been authorized for detection and/or diagnosis of SARS-CoV-2 by FDA under an Emergency Use Authorization (EUA).  This EUA will remain in effect (meaning this test can be used) for the duration of the COVID-19 declaration under Section 564(b)(1) of the Act, 21 U.S.C. section 360bbb-3(b)(1), unless the authorization is terminated or revoked sooner. Performed at [facility], [place] [address] [place], [place] [address]   Urinalysis, Routine w reflex microscopic     Status: Abnormal   Collection Time: 07/05/19  9:15 PM  Result Value Ref Range   Color, Urine STRAW (A) YELLOW   APPearance CLEAR CLEAR   Specific Gravity, Urine 1.012 1.005 - 1.030   pH 7.0 5.0 - 8.0   Glucose, UA NEGATIVE NEGATIVE mg/dL   Hgb urine dipstick NEGATIVE NEGATIVE  Bilirubin Urine NEGATIVE NEGATIVE   Ketones, ur 20 (A) NEGATIVE mg/dL   Protein, ur NEGATIVE NEGATIVE mg/dL   Nitrite NEGATIVE NEGATIVE   Leukocytes,Ua NEGATIVE NEGATIVE    Comment: Performed at [facility] [address] [place], [place] [address]  CBC WITH DIFFERENTIAL     Status: Abnormal   Collection Time: 07/05/19  9:23 PM  Result Value Ref Range   WBC 14.3 (H) 4.0 - 10.5 K/uL   RBC 4.40 3.87 - 5.11 MIL/uL   Hemoglobin 13.9 12.0 - 15.0 g/dL   HCT 41.9 36.0 - 46.0 %   MCV 95.2 80.0 - 100.0 fL   MCH 31.6 26.0 - 34.0 pg   MCHC 33.2 30.0 - 36.0 g/dL   RDW 13.5 11.5 - 15.5 %   Platelets 189 150 - 400 K/uL   nRBC 0.0 0.0 - 0.2 %   Neutrophils Relative % 83 %   Neutro Abs 11.9 (H) 1.7 - 7.7 K/uL   Lymphocytes Relative 9 %   Lymphs Abs 1.3 0.7 - 4.0 K/uL   Monocytes Relative 6 %   Monocytes Absolute 0.9 0.1 - 1.0 K/uL   Eosinophils Relative 1 %   Eosinophils Absolute 0.1 0.0 - 0.5 K/uL   Basophils Relative 0 %   Basophils Absolute 0.0 0.0 - 0.1 K/uL   Immature Granulocytes 1 %   Abs Immature  Granulocytes 0.08 (H) 0.00 - 0.07 K/uL    Comment: Performed at [facility] [address] [place], [place] [address]  Protime-INR     Status: None   Collection Time: 07/05/19  9:23 PM  Result Value Ref Range   Prothrombin Time 13.4 11.4 - 15.2 seconds   INR 1.0 0.8 - 1.2    Comment: (NOTE) INR goal varies based on device and disease states. Performed at [facility], [place] [address] [place], [place] [address]   Comprehensive metabolic panel     Status: Abnormal   Collection Time: 07/05/19  9:23 PM  Result Value Ref Range   Sodium 136 135 - 145 mmol/L   Potassium 3.6 3.5 - 5.1 mmol/L   Chloride 101 98 - 111 mmol/L   CO2 23 22 - 32 mmol/L   Glucose, Bld 121 (H) 70 - 99 mg/dL   BUN 17 8 - 23 mg/dL   Creatinine, Ser 0.80 0.44 - 1.00 mg/dL   Calcium 8.7 (L) 8.9 - 10.3 mg/dL   Total Protein 6.2 (L) 6.5 - 8.1 g/dL   Albumin 3.3 (L) 3.5 - 5.0 g/dL   AST 22 15 - 41 U/L   ALT 13 0 - 44 U/L   Alkaline Phosphatase 75 38 - 126 U/L   Total Bilirubin 0.8 0.3 - 1.2 mg/dL   GFR calc non Af Amer >60 >60 mL/min   GFR calc Af Amer >60 >60 mL/min   Anion gap 12 5 - 15    Comment: Performed at [facility] [address] [place], [place] [address]   Dg Chest 1 View  Result Date: 07/05/2019 CLINICAL DATA:  Left hip pain following a fall. EXAM: CHEST  1 VIEW COMPARISON:  04/08/2019 FINDINGS: Normal sized heart. Tortuous aorta. Clear lungs. Mild increase in prominence of the interstitial markings with some Kerley lines on the left. Decreased prominence of the vasculature. Diffuse osteopenia. Electronic devices overlying the left shoulder and left neck. IMPRESSION: Minimal changes of congestive heart failure superimposed on chronic interstitial lung disease. Electronically Signed   By: Remo Lipps  Joneen Caraway M.D.   On: 07/05/2019 20:46   Dg Knee 2 Views Left  Result Date: 07/05/2019 CLINICAL DATA:  Left hip pain following a fall. EXAM: LEFT KNEE - 1-2 VIEW COMPARISON:  None. FINDINGS:  Limited examination due to obliquity on the lateral view, distortion on the frontal view due to the fact that was obtained cross-table and no oblique views. No gross fracture or dislocation and no gross effusion seen. IMPRESSION: Limited examination demonstrating no gross fracture or effusion. Electronically Signed   By: Claudie Revering M.D.   On: 07/05/2019 20:44   Ct Head Wo Contrast  Result Date: 07/05/2019 CLINICAL DATA:  Neck pain after falling and hitting the back her head on kitchen cabinets tonight. EXAM: CT HEAD WITHOUT CONTRAST CT CERVICAL SPINE WITHOUT CONTRAST TECHNIQUE: Multidetector CT imaging of the head and cervical spine was performed following the standard protocol without intravenous contrast. Multiplanar CT image reconstructions of the cervical spine were also generated. COMPARISON:  Brain MR dated 04/08/2019. Head CT dated 04/20/2015. Cervical spine CT dated 06/05/2012 FINDINGS: CT HEAD FINDINGS Brain: Stable moderately enlarged ventricles and subarachnoid spaces. Mild-to-moderate patchy white matter low density in both cerebral hemispheres without significant change. No intracranial hemorrhage, mass lesion or CT evidence of acute infarction. Vascular: No hyperdense vessel or unexpected calcification. Skull: Normal. Negative for fracture or focal lesion. Sinuses/Orbits: Status post bilateral cataract extraction. Mild left ethmoid sinus mucosal thickening. Other: None. CT CERVICAL SPINE FINDINGS Alignment: Normal. Skull base and vertebrae: No acute fracture. No primary bone lesion or focal pathologic process. Soft tissues and spinal canal: No prevertebral fluid or swelling. No visible canal hematoma. Disc levels:  Mild multilevel degenerative changes. Upper chest: Unremarkable. Other: None. IMPRESSION: 1. No skull fracture or intracranial hemorrhage. 2. No cervical spine fracture or subluxation. 3. Stable moderate diffuse cerebral and cerebellar atrophy and mild-to-moderate chronic small vessel  white matter ischemic changes in both cerebral hemispheres. 4. Mild cervical spine degenerative changes. Electronically Signed   By: Claudie Revering M.D.   On: 07/05/2019 20:56   Ct Cervical Spine Wo Contrast  Result Date: 07/05/2019 CLINICAL DATA:  Neck pain after falling and hitting the back her head on kitchen cabinets tonight. EXAM: CT HEAD WITHOUT CONTRAST CT CERVICAL SPINE WITHOUT CONTRAST TECHNIQUE: Multidetector CT imaging of the head and cervical spine was performed following the standard protocol without intravenous contrast. Multiplanar CT image reconstructions of the cervical spine were also generated. COMPARISON:  Brain MR dated 04/08/2019. Head CT dated 04/20/2015. Cervical spine CT dated 06/05/2012 FINDINGS: CT HEAD FINDINGS Brain: Stable moderately enlarged ventricles and subarachnoid spaces. Mild-to-moderate patchy white matter low density in both cerebral hemispheres without significant change. No intracranial hemorrhage, mass lesion or CT evidence of acute infarction. Vascular: No hyperdense vessel or unexpected calcification. Skull: Normal. Negative for fracture or focal lesion. Sinuses/Orbits: Status post bilateral cataract extraction. Mild left ethmoid sinus mucosal thickening. Other: None. CT CERVICAL SPINE FINDINGS Alignment: Normal. Skull base and vertebrae: No acute fracture. No primary bone lesion or focal pathologic process. Soft tissues and spinal canal: No prevertebral fluid or swelling. No visible canal hematoma. Disc levels:  Mild multilevel degenerative changes. Upper chest: Unremarkable. Other: None. IMPRESSION: 1. No skull fracture or intracranial hemorrhage. 2. No cervical spine fracture or subluxation. 3. Stable moderate diffuse cerebral and cerebellar atrophy and mild-to-moderate chronic small vessel white matter ischemic changes in both cerebral hemispheres. 4. Mild cervical spine degenerative changes. Electronically Signed   By: Claudie Revering M.D.   On: 07/05/2019 20:56  Dg  Hip Unilat With Pelvis 2-3 Views Left  Result Date: 07/05/2019 CLINICAL DATA:  84 year old female with suspected left hip facture after a fall. Left hip pain. EXAM: DG HIP (WITH OR WITHOUT PELVIS) 2-3V LEFT COMPARISON:  No priors. FINDINGS: There is an acute mildly displaced mildly comminuted intertrochanteric fracture of the left hip which is associated with increased varus angulation. There is also likely some rotation of the femoral head in the acetabulum causing distortion of the inter trochanteric regions. Femoral head remains located. Bony pelvis and visualized portions of the right proximal femur appear intact. IMPRESSION: 1. Acute mildly comminuted, displaced and angulated intertrochanteric fracture of the left hip. Electronically Signed   By: Vinnie Langton M.D.   On: 07/05/2019 20:43    Pending Labs Unresulted Labs (From admission, onward)    Start     Ordered   07/06/19 0500  Comprehensive metabolic panel  Tomorrow morning,   R     07/05/19 2302   07/06/19 0500  CBC  Tomorrow morning,   R     07/05/19 2302   07/06/19 0500  TSH  Tomorrow morning,   R     07/05/19 2351          Vitals/Pain Today's Vitals   07/05/19 1915 07/05/19 2100 07/05/19 2115 07/05/19 2300  BP: (!) 150/73 127/67    Pulse: 87  78   Resp:   18   Temp:      TempSrc:      SpO2: 97%  96%   Weight:      Height:      PainSc: 10-Worst pain ever 6   7     Isolation Precautions No active isolations  Medications Medications  sodium chloride flush (NS) 0.9 % injection 3 mL (has no administration in time range)  0.9 %  sodium chloride infusion (has no administration in time range)  acetaminophen (TYLENOL) tablet 650 mg (has no administration in time range)    Or  acetaminophen (TYLENOL) suppository 650 mg (has no administration in time range)  ondansetron (ZOFRAN) injection 4 mg (has no administration in time range)  HYDROmorphone (DILAUDID) injection 0.5 mg (has no administration in time range)   aspirin EC tablet 81 mg (has no administration in time range)  mirtazapine (REMERON) tablet 7.5 mg (has no administration in time range)  ondansetron (ZOFRAN) injection 4 mg (4 mg Intravenous Given 07/05/19 2105)  HYDROmorphone (DILAUDID) injection 0.5 mg (0.5 mg Intravenous Given 07/05/19 2304)    Mobility non-ambulatory High fall risk   Focused Assessments    R Recommendations: See Admitting Provider Note  Report given to:   Additional Notes:

## 2019-07-06 NOTE — Anesthesia Procedure Notes (Signed)
Procedure Name: LMA Insertion Date/Time: 07/06/2019 3:27 PM Performed by: Suzy Bouchard, CRNA Pre-anesthesia Checklist: Patient identified, Emergency Drugs available, Suction available, Patient being monitored and Timeout performed Patient Re-evaluated:Patient Re-evaluated prior to induction Oxygen Delivery Method: Circle system utilized Preoxygenation: Pre-oxygenation with 100% oxygen Induction Type: IV induction Ventilation: Mask ventilation without difficulty LMA: LMA inserted LMA Size: 4.0 Number of attempts: 1 Placement Confirmation: positive ETCO2 and breath sounds checked- equal and bilateral Tube secured with: Tape Dental Injury: Teeth and Oropharynx as per pre-operative assessment

## 2019-07-06 NOTE — Transfer of Care (Signed)
Immediate Anesthesia Transfer of Care Note  Patient: Lauren Moreno  Procedure(s) Performed: INTRAMEDULLARY (IM) NAIL LEFT HIP (Left Leg Upper)  Patient Location: PACU  Anesthesia Type:General  Level of Consciousness: drowsy, opens eyes to name.   Airway & Oxygen Therapy: Patient Spontanous Breathing and Patient connected to nasal cannula oxygen  Post-op Assessment: Report given to RN and Post -op Vital signs reviewed and stable  Post vital signs: Reviewed and stable  Last Vitals:  Vitals Value Taken Time  BP 118/65 07/06/19 1659  Temp    Pulse 88 07/06/19 1701  Resp 29 07/06/19 1701  SpO2 100 % 07/06/19 1701  Vitals shown include unvalidated device data.  Last Pain:  Vitals:   07/06/19 1429  TempSrc:   PainSc: 5       Patients Stated Pain Goal: 0 ([id number])  Complications: No apparent anesthesia complications

## 2019-07-06 NOTE — Progress Notes (Addendum)
  Echocardiogram 2D Echocardiogram has been performed.  Patient unable to lay LLD position due to leg pain.   Smt. Loder L Androw 07/06/2019, 10:28 AM

## 2019-07-06 NOTE — Interval H&P Note (Signed)
I participated in the care of this patient and agree with the above history, physical and evaluation. I performed a review of the history and a physical exam as detailed   Timothy Daniel Murphy MD  

## 2019-07-06 NOTE — Plan of Care (Signed)

## 2019-07-06 NOTE — Anesthesia Preprocedure Evaluation (Addendum)
Anesthesia Evaluation  Patient identified by MRN, date of birth, ID band Patient awake    Reviewed: Allergy & Precautions, NPO status , Patient's Chart, lab work & pertinent test results  History of Anesthesia Complications Negative for: history of anesthetic complications  Airway Mallampati: II  TM Distance: >3 FB Neck ROM: Full    Dental  (+) Dental Advisory Given, Teeth Intact   Pulmonary neg pulmonary ROS,    breath sounds clear to auscultation       Cardiovascular hypertension (no meds),  Rhythm:Irregular Rate:Normal   '20 TTE - EF 65 to 70%. Left ventricular diastolic Doppler parameters are consistent with impaired relaxation pattern of LV diastolic filling. Tricuspid and aortic valve regurgitation is trivial. RV-RA gradient suggests possible moderately elevated pulmonary artery pressure.  HR on auscultation is irregular. Monitor shows clear P waves, sinus arrhythmia    Neuro/Psych TIAnegative psych ROS   GI/Hepatic negative GI ROS, Neg liver ROS,   Endo/Other   Obesity   Renal/GU negative Renal ROS     Musculoskeletal negative musculoskeletal ROS (+)   Abdominal   Peds  Hematology negative hematology ROS (+)   Anesthesia Other Findings   Reproductive/Obstetrics                           Anesthesia Physical Anesthesia Plan  ASA: III  Anesthesia Plan: General   Post-op Pain Management:    Induction: Intravenous  PONV Risk Score and Plan: 4 or greater and Treatment may vary due to age or medical condition, Ondansetron and Propofol infusion  Airway Management Planned: LMA  Additional Equipment: None  Intra-op Plan:   Post-operative Plan: Extubation in OR  Informed Consent: I have reviewed the patients History and Physical, chart, labs and discussed the procedure including the risks, benefits and alternatives for the proposed anesthesia with the patient or authorized  representative who has indicated his/her understanding and acceptance.     Dental advisory given  Plan Discussed with: CRNA and Anesthesiologist  Anesthesia Plan Comments:        Anesthesia Quick Evaluation

## 2019-07-06 NOTE — Progress Notes (Signed)
Patient and rings and hearing aids and bra given  to Daughter Bethena Roys.

## 2019-07-07 ENCOUNTER — Telehealth: Payer: Self-pay | Admitting: Internal Medicine

## 2019-07-07 ENCOUNTER — Encounter (HOSPITAL_COMMUNITY): Payer: Self-pay | Admitting: Orthopedic Surgery

## 2019-07-07 LAB — CBC WITH DIFFERENTIAL/PLATELET
Abs Immature Granulocytes: 0.05 10*3/uL (ref 0.00–0.07)
Basophils Absolute: 0 10*3/uL (ref 0.0–0.1)
Basophils Relative: 0 %
Eosinophils Absolute: 0 10*3/uL (ref 0.0–0.5)
Eosinophils Relative: 0 %
HCT: 33.8 % — ABNORMAL LOW (ref 36.0–46.0)
Hemoglobin: 11.2 g/dL — ABNORMAL LOW (ref 12.0–15.0)
Immature Granulocytes: 0 %
Lymphocytes Relative: 15 %
Lymphs Abs: 1.8 10*3/uL (ref 0.7–4.0)
MCH: 31.6 pg (ref 26.0–34.0)
MCHC: 33.1 g/dL (ref 30.0–36.0)
MCV: 95.5 fL (ref 80.0–100.0)
Monocytes Absolute: 1.5 10*3/uL — ABNORMAL HIGH (ref 0.1–1.0)
Monocytes Relative: 13 %
Neutro Abs: 8.4 10*3/uL — ABNORMAL HIGH (ref 1.7–7.7)
Neutrophils Relative %: 72 %
Platelets: 173 10*3/uL (ref 150–400)
RBC: 3.54 MIL/uL — ABNORMAL LOW (ref 3.87–5.11)
RDW: 13.8 % (ref 11.5–15.5)
WBC: 11.9 10*3/uL — ABNORMAL HIGH (ref 4.0–10.5)
nRBC: 0 % (ref 0.0–0.2)

## 2019-07-07 LAB — BASIC METABOLIC PANEL
Anion gap: 10 (ref 5–15)
BUN: 18 mg/dL (ref 8–23)
CO2: 23 mmol/L (ref 22–32)
Calcium: 8.9 mg/dL (ref 8.9–10.3)
Chloride: 104 mmol/L (ref 98–111)
Creatinine, Ser: 0.66 mg/dL (ref 0.44–1.00)
GFR calc Af Amer: >60 mL/min (ref >60)
GFR calc non Af Amer: >60 mL/min (ref >60)
Glucose, Bld: 119 mg/dL — ABNORMAL HIGH (ref 70–99)
Potassium: 3.7 mmol/L (ref 3.5–5.1)
Sodium: 137 mmol/L (ref 135–145)

## 2019-07-07 NOTE — Progress Notes (Signed)
Pt. Moved to [id number]. Pts. Son called and made aware. Pt. Alert and stable at the time. No distress noted.

## 2019-07-07 NOTE — Plan of Care (Signed)

## 2019-07-07 NOTE — Telephone Encounter (Signed)
New Message:  Lauren Moreno, daughter of the patient called. She wanted to let Dr. Harrington Challenger know that the patient fell on Saturday 10-03 and was taken to [facility]. She broke her hip, and had surgery yesterday, 10-04. The surgery went well. The daughter reports that the patient is still wearing the monitor. She wanted to know if anything was out of the ordinary the Friday/Saturday before the patient fell, because the patient was fatigued and lightheaded prior to her fall.   The daughter states that the patient is set to take it off on Friday. She hopes to be the one to take it off. If she is unable to take it off herself, she will have a nurse take it off for her. She still has the box and everything to mail the monitor back.  The daughter know if there is anything else Dr. Harrington Challenger would like her to do in regards to the monitor.

## 2019-07-07 NOTE — TOC Initial Note (Signed)
Transition of Care [facility]) - Initial/Assessment Note    Patient Details  Name: Lauren Moreno MRN: [id number] Date of Birth: 1927-10-19  [facility]) CM/SW Contact:    Carles Collet, RN Phone Number: 07/07/2019, 9:01 AM  Clinical Narrative:             Spoke w patient at the bedside. She underwent IM nailing of her hip yesterday and is awaiting PT eval. She states that she lives at home alone. She has three children but none would be able to stay with her after discharge. AT home she has several canes and a walker. She stated that she stayed at [facility] after she broke her leg some time ago and would be agreeable to SNF if needed again. TOC will continue to follow.        Expected Discharge Plan: Skilled Nursing Facility Barriers to Discharge: Continued Medical Work up   Patient Goals and CMS Choice Patient states their goals for this hospitalization and ongoing recovery are:: to safely be able to return to home      Expected Discharge Plan and Services Expected Discharge Plan: [place]   Discharge Planning Services: CM Consult   Living arrangements for the past 2 months: Single Family Home                                      Prior Living Arrangements/Services Living arrangements for the past 2 months: Single Family Home Lives with:: Self                   Activities of Daily Living      Permission Sought/Granted                  Emotional Assessment              Admission diagnosis:  Wrist pain [M25.539] Fall [W19.XXXA] Closed intertrochanteric fracture of hip, left, initial encounter [facility]) [S72.142A] Patient Active Problem List   Diagnosis Date Noted  . Pain and swelling of left wrist 07/06/2019  . Abnormal CXR 07/06/2019  . Allergic rhinitis 07/06/2019  . Intertrochanteric fracture of left femur ([place]) 07/05/2019  . Weakness generalized   . Chronic venous insufficiency   . Fall   . Dehydration   . Near  syncope 03/21/2015   PCP:  Philmore Pali, NP Pharmacy:   [facility], [address] [place] [address] Phone: [phone] Fax: [phone]     Social Determinants of Health (SDOH) Interventions    Readmission Risk Interventions No flowsheet data found.

## 2019-07-07 NOTE — Progress Notes (Signed)
    Subjective: Patient reports pain as mild to moderate.  Not yet up out of bed.  Objective:   VITALS:   Vitals:   07/06/19 2100 07/07/19 0050 07/07/19 0201 07/07/19 0603  BP: (!) 115/53 (!) 99/57 (!) 95/41 (!) 112/31  Pulse: 93  69 94  Resp:  20 18 18   Temp: 97.7 F (36.5 C) 98.7 F (37.1 C) 98.7 F (37.1 C) 98.2 F (36.8 C)  TempSrc:  Oral Oral Oral  SpO2: 94% 94% 96% (!) 89%  Weight:   80 kg   Height:       CBC Latest Ref Rng & Units 07/07/2019 07/06/2019 07/05/2019  WBC 4.0 - 10.5 K/uL 11.9(H) 12.3(H) 14.3(H)  Hemoglobin 12.0 - 15.0 g/dL 11.2(L) 12.8 13.9  Hematocrit 36.0 - 46.0 % 33.8(L) 38.4 41.9  Platelets 150 - 400 K/uL 173 190 189   BMP Latest Ref Rng & Units 07/07/2019 07/06/2019 07/05/2019  Glucose 70 - 99 mg/dL 119(H) 118(H) 121(H)  BUN 8 - 23 mg/dL 18 15 17   Creatinine 0.44 - 1.00 mg/dL 0.66 0.52 0.80  Sodium 135 - 145 mmol/L 137 138 136  Potassium 3.5 - 5.1 mmol/L 3.7 3.8 3.6  Chloride 98 - 111 mmol/L 104 106 101  CO2 22 - 32 mmol/L 23 24 23   Calcium 8.9 - 10.3 mg/dL 8.9 8.7(L) 8.7(L)   Intake/Output      10/04 0701 - 10/05 0700 10/05 0701 - 10/06 0700   P.O. 360    I.V. (mL/kg) 459.8 (5.7)    IV Piggyback 100    Total Intake(mL/kg) 919.8 (11.5)    Urine (mL/kg/hr) 100 (0.1)    Blood 75    Total Output 175    Net +744.8            Physical Exam: General: NAD.  Upright in bed.  Calm and conversant.  No increased work of breathing. MSK LLE: Sensation intact distally Feet warm EHL/FHL, dorsiflexion/Plantar flexion intact Incision: dressings C/D/I   Assessment: 1 Day Post-Op  S/P Procedure(s) (LRB): INTRAMEDULLARY (IM) NAIL LEFT HIP (Left) by Dr. Ernesta Amble. Percell Miller on 07/06/2019  Principal Problem:   Intertrochanteric fracture of left femur ([place]) Active Problems:   Pain and swelling of left wrist   Abnormal CXR   Allergic rhinitis   Closed comminuted left intertrochanteric femur fracture, status post cephalo-medullary IM nail Pain  controlled Not yet mobilized  Plan: Up with therapy Incentive Spirometry Elevate and Apply ice Continue care for medical conditions per primary team.  Weightbearing: WBAT LLE Insicional and dressing care: Dressings left intact until follow-up Showering: Keep dressing dry VTE prophylaxis: Lovenox, SCDs, ambulation Pain control: Tylenol.  Minimize narcotics.  Continue current regimen. Follow - up plan: 2 weeks in the office with Dr. Leamon Arnt information:  [name] MD, Roxan Hockey PA-C  Dispo: TBD.  Therapy evaluations pending.  The patient lives alone.  Discharge when mobilized and ready medically.   Charna [name] Martensen III, PA-C 07/07/2019, 7:55 AM

## 2019-07-07 NOTE — Telephone Encounter (Signed)
Left message for patient's daughter to call back. 

## 2019-07-07 NOTE — Progress Notes (Signed)
Rehab Admissions Coordinator Note:  Per PT request, this patient was screened by Raechel Ache for appropriateness for an Inpatient Acute Rehab Consult.  After chart review, we agree with the PT and OT initial evaluation recommendation for SNF. Due to limited tolerance and lack of social support at DC, feel pt is most appropriate for SNF. AC will not pursue CIR for this patient.   Raechel Ache 07/07/2019, 5:35 PM  I can be reached at [phone].

## 2019-07-07 NOTE — Progress Notes (Signed)
PROGRESS NOTE  Lauren Moreno [id number] DOB: 1928/04/16 DOA: 07/05/2019 PCP: Philmore Pali, NP  Brief History   Lauren Moreno  is a 85 y.o. female, w hypertension, hyperlipidemia, thyroid disease, apparently sufferred mechanical fall and c/o left hip pain.  Pt apparently fell backwards, and hit her head, There was no syncope per her family.   In the ED the patient was found to have an acute mildly comminuted displaced and angulated intertrochanteric fracture of the left hip. CT of the head demonstrated no skull fracture or intracranial hemorrhage, no c-spine fracture or subluxation, and stable moderate diffuse cerebral and cerebellar atrophy and mild -moderate chronic small vessel white matter ischemic changes in both cerebral hemispheres. Mild cervical spine degenerative changes. CXR demonstrated minimal changes of congestive heart failure with chronic interstitial lung disease.   The patient  Underwent ORIF of her left hip on 07/06/2019. She has tolerated the procedure well.She awaits SNF placement.  Consultants  . Orthopedic surgery  Procedures  . ORIF left hip  Antibiotics   Anti-infectives (From admission, onward)   Start     Dose/Rate Route Frequency Ordered Stop   07/06/19 2200  clindamycin (CLEOCIN) IVPB 600 mg     600 mg 100 mL/hr over 30 Minutes Intravenous Every 6 hours 07/06/19 1751 07/07/19 0600   07/06/19 0900  clindamycin (CLEOCIN) IVPB 900 mg     900 mg 100 mL/hr over 30 Minutes Intravenous To Short Stay 07/06/19 0840 07/06/19 1557      Subjective  The patient is complaining of pain in her left leg. No new complaints.  Objective   Vitals:  Vitals:   07/07/19 0828 07/07/19 1321  BP: 102/61 (!) 101/54  Pulse: 83   Resp: 18   Temp: 97.9 F (36.6 C)   SpO2: 95%    Exam:  Constitutional:  . The patient is awake, alert, and oriented x 3. No acute distress. Marland Kitchen  Respiratory:  . No increased work of breathing. . No wheezes, rales, or rhonchi . No  tactile fremitus Cardiovascular:  . Regular rate and rhythm . No murmurs, ectopy, or gallups. . No lateral PMI. No thrills. Abdomen:  . Abdomen is soft, non-tender, non-distended . No hernias, masses, or organomegaly . Normoactive bowel sounds.  Musculoskeletal:  . No cyanosis, clubbing, or edema Skin:  . No rashes, lesions, ulcers . palpation of skin: no induration or nodules Neurologic:  . CN 2-12 intact . Sensation all 4 extremities intact Psychiatric:  . Mental status o Mood, affect appropriate o Orientation to person, place, time  . judgment and insight appear intact  I have personally reviewed the following:   Today's Data  . Vitals, CMP, CBC, BNP, Troponin  Scheduled Meds: . acetaminophen  500 mg Oral Q6H  . enoxaparin (LOVENOX) injection  40 mg Subcutaneous Q24H   Continuous Infusions: . sodium chloride      Principal Problem:   Intertrochanteric fracture of left femur ([facility]) Active Problems:   Pain and swelling of left wrist   Abnormal CXR   Allergic rhinitis   LOS: 2 days   A & P  L intertrochanteric hip fracture: S/P ORIF of left hip on 07/06/2019.  L wrist swelling: Xray ordered, pt refused. Appears improved.  Evidence of CHF on CXR: BNP mildly increased. No respiratory distress. No history of CHF. Echocardiogram was performed today that demonstrated [id number] with diastolic dysfunction. The patient appears well compenstated. Monitor volume status. Troponin negative.  Hypertension: Blood pressures are well controlled. She is on  no antihypertensives at home.   Insomnia at times: Continue Remeron 7.5mg  po qhs, prn insomnia.  Hx of Allerghic rhinitis: Cont benadryl prn   I have seen and examined this patient myself. I have spent 32 minutes in her evaluation and care.  DVT Prophylaxis:  SCDs , no lovenox since to OR in AM. Defer to orthopedics regarding DVT prophylaxis. Family Communication: Daughter at bedside. Code Status:  FULL CODE per pt,  son present at bedside Disposition: SNF  Kyndall Chaplin, DO [facility] Direct contact: see [email or web address]  7PM-7AM contact night coverage as above 07/07/2019, 1:50 PM  LOS: 1 day

## 2019-07-07 NOTE — Telephone Encounter (Signed)
Pt wearing 14 day zio (not live) monitor. This was ordered for bradycardia.

## 2019-07-07 NOTE — Evaluation (Signed)
Physical Therapy Evaluation Patient Details Name: Lauren Moreno MRN: [id number] DOB: [birth year] Today's Date: 07/07/2019   History of Present Illness  Lauren Moreno  is a 85 y.o. female, w hypertension, hyperlipidemia, thyroid disease, apparently sufferred mechanical fall and c/o left hip pain.  Pt apparently fell backwards, and hit her head, There was no syncope per her family.  Pt had ORIF left femur on 07-06-2019.    Clinical Impression  Pt attempted OOB today with PT/OT cotreat eval - pt got dizzy in standing and had to be assisted back to bed.  BP running low 102/58 - stayed low even after putting pt in trendelenburg to pull her up in bed.  I anticipate pt will progress but slowly - due to her pain and decreased use of left UE from old injury.  Pt will benefit from SNF level stay at DC to help her get back up on her feet and progress enough to get back home with inconsistent family support.  Acute PT will follow her on acute to help her progress to improved OOB status.    Follow Up Recommendations SNF;Supervision/Assistance - 24 hour    Equipment Recommendations  None recommended by PT    Recommendations for Other Services       Precautions / Restrictions Precautions Precautions: Fall Precaution Comments: Pt reprots she has had 3 falls this year Restrictions Weight Bearing Restrictions: Yes LLE Weight Bearing: Weight bearing as tolerated Other Position/Activity Restrictions: pt to have TED hose on when up      Mobility  Bed Mobility Overal bed mobility: Needs Assistance Bed Mobility: Supine to Sit;Sit to Supine     Supine to sit: Max assist;+2 for physical assistance Sit to supine: Max assist;+2 for physical assistance   General bed mobility comments: Pt sat up with HOB raised.  She needed max assist to move operated leg.  Pt had hard time scooting left side forward on the bed - kept scooting right side only.  Used chuck to get pt sitting comfortably  EOB  Transfers Overall transfer level: Needs assistance Equipment used: Rolling walker (2 wheeled) Transfers: Sit to/from Stand Sit to Stand: Mod assist;+2 physical assistance;From elevated surface         General transfer comment: pt did well standing from elevated bed with RW.  the RW was on lowest setting but still too tall for pt to get weight through her arms to be abel to turn and sit in chair.  pt will need youth RW.  Pt started getting dizzy in standing - did total assist - sitting pt back down and helping her scoot up in the bed.  It was hard getting teh chuck up under her - hard time rolling.  Pt left in bed - HOB up and eating in bed.  BP still low 102/58 (even after being in trendelenburg)  Ambulation/Gait             General Gait Details: unable  Stairs            Wheelchair Mobility    Modified Rankin (Stroke Patients Only)       Balance Overall balance assessment: Needs assistance   Sitting balance-Leahy Scale: Good Sitting balance - Comments: Pt sat EOB with minimal UE use - no loss of balance       Standing balance comment: Unable to assess secondary to dizziness and low BP  Pertinent Vitals/Pain Pain Assessment: 0-10 Pain Location: left hip - alot standing, not bad when back to bed. Pain Descriptors / Indicators: Burning;Discomfort;Operative site guarding;Guarding Pain Intervention(s): Limited activity within patient's tolerance;Monitored during session;Repositioned;Premedicated before session    Home Living Family/patient expects to be discharged to:: Skilled nursing facility                 Additional Comments: Pt went to [facility] last time she had broken leg    Prior Function Level of Independence: Independent with assistive device(s)         Comments: Pt lived home alone - used cane inside house. she did her own cleaning per her report. she no longer drives.  Family checks on ehr and  assists as needed. she has granddaughter that is PT that lives nearby but works full time.     Hand Dominance   Dominant Hand: Right    Extremity/Trunk Assessment   Upper Extremity Assessment Upper Extremity Assessment: Defer to OT evaluation    Lower Extremity Assessment Lower Extremity Assessment: LLE deficits/detail LLE Deficits / Details: Pt able to wiggle her toes - unable to move her leg without assist due to pain LLE: Unable to fully assess due to pain    Cervical / Trunk Assessment Cervical / Trunk Assessment: Kyphotic  Communication   Communication: HOH  Cognition Arousal/Alertness: Awake/alert Behavior During Therapy: WFL for tasks assessed/performed Overall Cognitive Status: Within Functional Limits for tasks assessed                                 General Comments: Pts son present for PT eval      General Comments General comments (skin integrity, edema, etc.): Pt wtih TED hose on and bandages dry    Exercises     Assessment/Plan    PT Assessment Patient needs continued PT services  PT Problem List Decreased strength;Decreased mobility;Decreased safety awareness;Decreased range of motion;Decreased knowledge of precautions;Decreased activity tolerance;Cardiopulmonary status limiting activity;Decreased skin integrity;Decreased balance;Decreased knowledge of use of DME;Pain       PT Treatment Interventions DME instruction;Therapeutic activities;Gait training;Therapeutic exercise;Patient/family education;Balance training;Functional mobility training    PT Goals (Current goals can be found in the Care Plan section)  Acute Rehab PT Goals Patient Stated Goal: to recover and get back home PT Goal Formulation: With patient/family Time For Goal Achievement: 07/21/19 Potential to Achieve Goals: Good    Frequency Min 3X/week   Barriers to discharge Decreased caregiver support pt will need 24/7 care at DC    Co-evaluation PT/OT/SLP  Co-Evaluation/Treatment: Yes Reason for Co-Treatment: Complexity of the patient's impairments (multi-system involvement);For patient/therapist safety PT goals addressed during session: Mobility/safety with mobility;Proper use of DME OT goals addressed during session: Proper use of Adaptive equipment and DME       AM-PAC PT "6 Clicks" Mobility  Outcome Measure Help needed turning from your back to your side while in a flat bed without using bedrails?: A Lot Help needed moving from lying on your back to sitting on the side of a flat bed without using bedrails?: A Lot Help needed moving to and from a bed to a chair (including a wheelchair)?: A Lot Help needed standing up from a chair using your arms (e.g., wheelchair or bedside chair)?: A Lot Help needed to walk in hospital room?: Total Help needed climbing 3-5 steps with a railing? : Total 6 Click Score: 10    End of Session Equipment  Utilized During Treatment: Gait belt Activity Tolerance: Treatment limited secondary to medical complications (Comment) Patient left: in bed;with family/visitor present;with call bell/phone within reach Nurse Communication: Mobility status;Precautions PT Visit Diagnosis: Other abnormalities of gait and mobility (R26.89);Repeated falls (R29.6);Muscle weakness (generalized) (M62.81);History of falling (Z91.81);Difficulty in walking, not elsewhere classified (R26.2);Pain Pain - Right/Left: Left Pain - part of body: Hip    Time: [id number] PT Time Calculation (min) (ACUTE ONLY): 50 min   Charges:   PT Evaluation $PT Eval Moderate Complexity: 1 Mod PT Treatments $Therapeutic Activity: 8-22 mins        07/07/2019   Rande Lawman, PT   Loyal Buba 07/07/2019, 1:37 PM

## 2019-07-07 NOTE — Progress Notes (Signed)
Occupational Therapy Evaluation Patient Details Name: Lauren Moreno MRN: [id number] DOB: May 08, 1928 Today's Date: 07/07/2019    History of Present Illness Lauren Moreno  is a 85 y.o. female, w hypertension, hyperlipidemia, thyroid disease, apparently sufferred mechanical fall and c/o left hip pain.  Pt apparently fell backwards, and hit her head, There was no syncope per her family.  Pt had ORIF left femur on 07-06-2019.   Clinical Impression   PTA, pt was living at home alone, and was independent with ADL/IADL and functional mobility. Pt currently requires modA+2 for sit<>stand from elevated surface. Upon standing pt became lightheaded and dizzy required return to sitting, while sitting dizziness increased and pt stated she "felt like she was going to pass out", pt returned to supine with totalA+2. BP 101/53 with immediate return to supine. Pt placed in trendelenburg to reposition in bed, following her BP was 102/58. RN aware. Due to decline in current level of function, pt would benefit from acute OT to address established goals to facilitate safe D/C to venue listed below. At this time, recommend SNF follow-up. Will continue to follow acutely.     Follow Up Recommendations  SNF;Supervision/Assistance - 24 hour    Equipment Recommendations  3 in 1 bedside commode    Recommendations for Other Services       Precautions / Restrictions Precautions Precautions: Fall Precaution Comments: Pt reports she has had 3 falls this year Restrictions Weight Bearing Restrictions: Yes LLE Weight Bearing: Weight bearing as tolerated Other Position/Activity Restrictions: pt to have TED hose on when up      Mobility Bed Mobility Overal bed mobility: Needs Assistance Bed Mobility: Supine to Sit;Sit to Supine     Supine to sit: Max assist;+2 for physical assistance Sit to supine: Max assist;+2 for physical assistance   General bed mobility comments: Pt sat up with HOB raised.  She needed  max assist to move operated leg.  Pt had hard time scooting left side forward on the bed - kept scooting right side only.  Used chuck to get pt sitting comfortably EOB  Transfers Overall transfer level: Needs assistance Equipment used: Rolling walker (2 wheeled) Transfers: Sit to/from Stand Sit to Stand: Mod assist;+2 physical assistance;From elevated surface         General transfer comment: pt did well standing from elevated bed with RW.  the RW was on lowest setting but still too tall for pt to get weight through her arms to be abel to turn and sit in chair.  pt will need youth RW.  Pt started getting dizzy in standing - did total assist - sitting pt back down and helping her scoot up in the bed.  It was hard getting teh chuck up under her - hard time rolling.  Pt left in bed - HOB up and eating in bed.  BP still low 102/58 (even after being in trendelenburg)    Balance Overall balance assessment: Needs assistance   Sitting balance-Leahy Scale: Good Sitting balance - Comments: Pt sat EOB with minimal UE use - no loss of balance       Standing balance comment: Unable to assess secondary to dizziness and low BP                           ADL either performed or assessed with clinical judgement   ADL Overall ADL's : Needs assistance/impaired Eating/Feeding: Set up;Sitting   Grooming: Set up;Sitting   Upper Body Bathing:  Minimal assistance;Sitting   Lower Body Bathing: +2 for physical assistance;Moderate assistance   Upper Body Dressing : Minimal assistance;Sitting   Lower Body Dressing: Moderate assistance;+2 for physical assistance               Functional mobility during ADLs: Moderate assistance;+2 for physical assistance General ADL Comments: began to attempt simulated toilet transfer, pt became lightheaded and required return to sitting, pt had near sycopal episode, returned to supine with helicopter technique from OT/PT BP 101/53     Vision Baseline  Vision/History: Wears glasses Wears Glasses: At all times Patient Visual Report: No change from baseline       Perception     Praxis      Pertinent Vitals/Pain Pain Assessment: Faces Faces Pain Scale: Hurts even more Pain Location: left hip - alot standing, not bad when back to bed. Pain Descriptors / Indicators: Burning;Discomfort;Operative site guarding;Guarding Pain Intervention(s): Limited activity within patient's tolerance;Monitored during session;Repositioned     Hand Dominance Right   Extremity/Trunk Assessment Upper Extremity Assessment Upper Extremity Assessment: Generalized weakness;LUE deficits/detail LUE Deficits / Details: reports of previous injury limiting use functionally and with increased pain during movement LUE: Unable to fully assess due to pain LUE Sensation: WNL   Lower Extremity Assessment Lower Extremity Assessment: Defer to PT evaluation LLE Deficits / Details: Pt able to wiggle her toes - unable to move her leg without assist due to pain LLE: Unable to fully assess due to pain   Cervical / Trunk Assessment Cervical / Trunk Assessment: Kyphotic   Communication Communication Communication: HOH   Cognition Arousal/Alertness: Awake/alert Behavior During Therapy: WFL for tasks assessed/performed Overall Cognitive Status: Within Functional Limits for tasks assessed                                 General Comments: Pts son present for eval   General Comments  pt with TED hose on and bandages dry    Exercises     Shoulder Instructions      Home Living Family/patient expects to be discharged to:: Skilled nursing facility                                 Additional Comments: Pt went to [facility] last time she had broken leg      Prior Functioning/Environment Level of Independence: Independent with assistive device(s)        Comments: Pt lived home alone - used cane inside house. she did her own cleaning per her  report. she no longer drives.  Family checks on ehr and assists as needed. she has granddaughter that is PT that lives nearby but works full time.        OT Problem List: Decreased strength;Decreased range of motion;Decreased activity tolerance;Impaired balance (sitting and/or standing);Decreased safety awareness;Decreased knowledge of use of DME or AE;Decreased knowledge of precautions;Pain      OT Treatment/Interventions: Self-care/ADL training;Therapeutic exercise;Energy conservation;DME and/or AE instruction;Therapeutic activities;Patient/family education;Balance training    OT Goals(Current goals can be found in the care plan section) Acute Rehab OT Goals Patient Stated Goal: to recover and get back home OT Goal Formulation: With patient Time For Goal Achievement: 07/21/19 Potential to Achieve Goals: Good ADL Goals Pt Will Perform Grooming: with modified independence Pt Will Perform Upper Body Dressing: with modified independence Pt Will Perform Lower Body Dressing: with modified independence;sit to/from stand  Pt Will Transfer to Toilet: with modified independence;stand pivot transfer Additional ADL Goal #1: Pt will complete bed mobility with modified independence in preparation for ADL.  OT Frequency: Min 2X/week   Barriers to D/C: Decreased caregiver support  pt lives alone       Co-evaluation PT/OT/SLP Co-Evaluation/Treatment: Yes Reason for Co-Treatment: Complexity of the patient's impairments (multi-system involvement);For patient/therapist safety;To address functional/ADL transfers PT goals addressed during session: Mobility/safety with mobility;Proper use of DME OT goals addressed during session: ADL's and self-care      AM-PAC OT "6 Clicks" Daily Activity     Outcome Measure Help from another person eating meals?: A Little Help from another person taking care of personal grooming?: A Little Help from another person toileting, which includes using toliet, bedpan, or  urinal?: A Lot Help from another person bathing (including washing, rinsing, drying)?: A Lot Help from another person to put on and taking off regular upper body clothing?: A Little Help from another person to put on and taking off regular lower body clothing?: A Lot 6 Click Score: 15   End of Session Equipment Utilized During Treatment: Gait belt;Rolling walker Nurse Communication: Mobility status  Activity Tolerance: Patient tolerated treatment well;Treatment limited secondary to medical complications (Comment)(near syncopal episode) Patient left: in bed;with call bell/phone within reach;with bed alarm set;with family/visitor present  OT Visit Diagnosis: Unsteadiness on feet (R26.81);Other abnormalities of gait and mobility (R26.89);Muscle weakness (generalized) (M62.81);History of falling (Z91.81);Pain Pain - Right/Left: Left Pain - part of body: Leg;Hip                Time: 1240-1318 OT Time Calculation (min): 38 min Charges:  OT General Charges $OT Visit: 1 Visit OT Evaluation $OT Eval Moderate Complexity: 1 Mod OT Treatments $Self Care/Home Management : 8-22 mins  Dorinda Hill OTR/L Acute Rehabilitation Services Office: [phone]   Wyn Forster 07/07/2019, 3:08 PM

## 2019-07-08 NOTE — Plan of Care (Signed)
  Problem: Pain Managment: Goal: General experience of comfort will improve Outcome: Progressing   Problem: Safety: Goal: Ability to remain free from injury will improve Outcome: Progressing   Problem: Skin Integrity: Goal: Risk for impaired skin integrity will decrease Outcome: Progressing   

## 2019-07-08 NOTE — Progress Notes (Signed)
PROGRESS NOTE  COMFORT EMDE [id number] DOB: 08-07-1928 DOA: 07/05/2019 PCP: Philmore Pali, NP  Brief History   Lauren Moreno  is a 85 y.o. female, w hypertension, hyperlipidemia, thyroid disease, apparently sufferred mechanical fall and c/o left hip pain.  Pt apparently fell backwards, and hit her head, There was no syncope per her family.   In the ED the patient was found to have an acute mildly comminuted displaced and angulated intertrochanteric fracture of the left hip. CT of the head demonstrated no skull fracture or intracranial hemorrhage, no c-spine fracture or subluxation, and stable moderate diffuse cerebral and cerebellar atrophy and mild -moderate chronic small vessel white matter ischemic changes in both cerebral hemispheres. Mild cervical spine degenerative changes. CXR demonstrated minimal changes of congestive heart failure with chronic interstitial lung disease.   The patient  Underwent ORIF of her left hip on 07/06/2019. She has tolerated the procedure well.She awaits SNF placement.  Consultants  . Orthopedic surgery  Procedures  . ORIF left hip  Antibiotics   Anti-infectives (From admission, onward)   Start     Dose/Rate Route Frequency Ordered Stop   07/06/19 2200  clindamycin (CLEOCIN) IVPB 600 mg     600 mg 100 mL/hr over 30 Minutes Intravenous Every 6 hours 07/06/19 1751 07/07/19 0600   07/06/19 0900  clindamycin (CLEOCIN) IVPB 900 mg     900 mg 100 mL/hr over 30 Minutes Intravenous To Short Stay 07/06/19 0840 07/06/19 1557      Subjective  The patient is complaining of pain in her left leg. No new complaints. She is wondering if she had a cardiac event that may have resulted in her fall on 07/05/2019.   Objective   Vitals:  Vitals:   07/08/19 0418 07/08/19 1530  BP: (!) 124/53 (!) 114/53  Pulse: 61 76  Resp: 16 18  Temp: 98 F (36.7 C) 97.6 F (36.4 C)  SpO2: 96% 95%   Exam:  Constitutional:  . The patient is awake, alert, and oriented  x 3. No acute distress. Marland Kitchen  Respiratory:  . No increased work of breathing. . No wheezes, rales, or rhonchi . No tactile fremitus Cardiovascular:  . Regular rate and rhythm . No murmurs, ectopy, or gallups. . No lateral PMI. No thrills. Abdomen:  . Abdomen is soft, non-tender, non-distended . No hernias, masses, or organomegaly . Normoactive bowel sounds.  Musculoskeletal:  . No cyanosis, clubbing, or edema Skin:  . No rashes, lesions, ulcers . palpation of skin: no induration or nodules Neurologic:  . CN 2-12 intact . Sensation all 4 extremities intact Psychiatric:  . Mental status o Mood, affect appropriate o Orientation to person, place, time  . judgment and insight appear intact  I have personally reviewed the following:   Today's Data  . Vitals, CMP, CBC, BNP, Troponin  Scheduled Meds: . enoxaparin (LOVENOX) injection  40 mg Subcutaneous Q24H   Continuous Infusions: . sodium chloride      Principal Problem:   Intertrochanteric fracture of left femur (HCC) Active Problems:   Pain and swelling of left wrist   Abnormal CXR   Allergic rhinitis   LOS: 3 days   A & P  L intertrochanteric hip fracture: S/P ORIF of left hip on 07/06/2019.  L wrist swelling: Xray ordered, pt refused. Appears improved.  Evidence of CHF on CXR: BNP mildly increased. No respiratory distress. No history of CHF. Echocardiogram was performed today that demonstrated [id number] with diastolic dysfunction. The patient appears well compenstated.  Monitor volume status. Troponin negative. The patient has a cardiac event monitor. She is wondering if she had a cardiac event that may have resulted in her fall on 07/05/2019. I have called her cardiologist, Dr. Harrington Challenger to see if there was any suspicious cardiac activity recorded on 07/05/2019.  Hypertension: Blood pressures are well controlled. She is on no antihypertensives at home.   Insomnia at times: Continue Remeron 7.5mg  po qhs, prn insomnia.   Hx of Allerghic rhinitis: Cont benadryl prn   I have seen and examined this patient myself. I have spent 30 minutes in her evaluation and care.  DVT Prophylaxis:  SCDs. Defer to orthopedics regarding DVT prophylaxis. Family Communication: Daughter at bedside. Code Status:  FULL CODE per pt, son present at bedside Disposition: SNF  Yuriel Lopezmartinez, DO [facility] Direct contact: see [email or web address]  7PM-7AM contact night coverage as above 07/08/2019, 4:30 PM  LOS: 1 day

## 2019-07-08 NOTE — Progress Notes (Signed)
    Subjective: Patient reports pain as mild to moderate, controlled, but a little more sore.    Objective:   VITALS:   Vitals:   07/07/19 2021 07/07/19 2255 07/08/19 0418 07/08/19 0618  BP: (!) 134/48 140/62 (!) 124/53   Pulse: (!) 104 74 61   Resp: 18 16 16    Temp: 98.6 F (37 C) 98 F (36.7 C) 98 F (36.7 C)   TempSrc: Oral Oral Oral   SpO2: 95% 96% 96%   Weight:  83.1 kg  84.6 kg  Height:  5\' 2"  (1.575 m)     CBC Latest Ref Rng & Units 07/07/2019 07/06/2019 07/05/2019  WBC 4.0 - 10.5 K/uL 11.9(H) 12.3(H) 14.3(H)  Hemoglobin 12.0 - 15.0 g/dL 11.2(L) 12.8 13.9  Hematocrit 36.0 - 46.0 % 33.8(L) 38.4 41.9  Platelets 150 - 400 K/uL 173 190 189   BMP Latest Ref Rng & Units 07/07/2019 07/06/2019 07/05/2019  Glucose 70 - 99 mg/dL 119(H) 118(H) 121(H)  BUN 8 - 23 mg/dL 18 15 17   Creatinine 0.44 - 1.00 mg/dL 0.66 0.52 0.80  Sodium 135 - 145 mmol/L 137 138 136  Potassium 3.5 - 5.1 mmol/L 3.7 3.8 3.6  Chloride 98 - 111 mmol/L 104 106 101  CO2 22 - 32 mmol/L 23 24 23   Calcium 8.9 - 10.3 mg/dL 8.9 8.7(L) 8.7(L)   Intake/Output      10/05 0701 - 10/06 0700 10/06 0701 - 10/07 0700   P.O. 220    I.V. (mL/kg)     IV Piggyback     Total Intake(mL/kg) 220 (2.6)    Urine (mL/kg/hr) 50 (0)    Blood     Total Output 50    Net +170         Urine Occurrence 1 x       Physical Exam: General: NAD.  Supine in bed.  Calm and conversant.  No increased work of breathing. MSK LLE: Sensation intact distally Feet warm EHL/FHL, dorsiflexion/Plantar flexion intact Incision: dressings w/ scant drainge   Assessment: 2 Days Post-Op  S/P Procedure(s) (LRB): INTRAMEDULLARY (IM) NAIL LEFT HIP (Left) by Dr. Ernesta Amble. Percell Miller on 07/06/2019  Principal Problem:   Intertrochanteric fracture of left femur (HCC) Active Problems:   Pain and swelling of left wrist   Abnormal CXR   Allergic rhinitis   Closed comminuted left intertrochanteric femur fracture, status post cephalo-medullary IM nail  Pain controlled Slow mobilization  Plan: Up with therapy Incentive Spirometry Apply ice prn Continue care for medical conditions per primary team.  Weightbearing: WBAT LLE Insicional and dressing care: Dressings left intact until follow-up Showering: Keep dressing dry VTE prophylaxis: Lovenox, SCDs, ambulation Pain control: Tylenol.  Minimize narcotics.  Continue current regimen. Follow - up plan: 2 weeks in the office with Dr. Leamon Arnt information:  [name] MD, Roxan Hockey PA-C  Dispo:  Therapy evaluations ongoing.  SNF recommended.  The patient lives alone.  Discharge when mobilized and ready medically.   Lauren Moreno III, PA-C 07/08/2019, 7:22 AM

## 2019-07-08 NOTE — Care Management Important Message (Signed)
Important Message  Patient Details  Name: MILEYA GIRONDA MRN: [id number] Date of Birth: 12-Sep-1928   Medicare Important Message Given:  Yes     Shelda Altes 07/08/2019, 4:54 PM

## 2019-07-08 NOTE — Progress Notes (Signed)
Physical Therapy Treatment Patient Details Name: Lauren Moreno MRN: [id number] DOB: 1928-09-22 Today's Date: 07/08/2019    History of Present Illness Lauren Moreno  is a 85 y.o. female, w hypertension, hyperlipidemia, thyroid disease, apparently sufferred mechanical fall and c/o left hip pain.  Pt apparently fell backwards, and hit her head, There was no syncope per her family.  Pt had ORIF left femur on 07-06-2019.    PT Comments    Patient received in bed, pleasant and willing to work with therapy but reporting high levels of pain. Family present during session and observed today. She continues to require MaxA for supine to sit with HOB maximally raised, continues to be very pain limited with mobility. Once at EOB, she became increasingly woozy, stating "I feel faint, I'm going to pass out"; HR between 115-122BPM (87-92% of age predicted HR max), sat at EOB for a few minutes but symptoms and HR did not improve, patient became more pale and increased difficulty communicating with PT. Returned to supine with MaxAx1, required MaxAx2 for repositioning in bed. Once back in supine, HR recovered to 104BPM and patient's color returned/she was able to once again easily interact with PT and seemed to recover to baseline. BP stable and WNL with knee high TEDs during session. Education provided to family regarding reasoning for not pushing through symptoms and risk of adverse event if therapist had continued to push patient's mobility today, encouraged them to speak to medical team with any further questions for reasons for this event. She was left in bed with all needs met, bed alarm active and family present this afternoon.    Follow Up Recommendations  SNF;Supervision/Assistance - 24 hour     Equipment Recommendations  None recommended by PT    Recommendations for Other Services       Precautions / Restrictions Precautions Precautions: Fall Precaution Comments: Pt reports she has had 3 falls  this year Restrictions Weight Bearing Restrictions: Yes LLE Weight Bearing: Weight bearing as tolerated Other Position/Activity Restrictions: pt to have TED hose on when up    Mobility  Bed Mobility Overal bed mobility: Needs Assistance Bed Mobility: Supine to Sit;Sit to Supine     Supine to sit: Max assist;HOB elevated Sit to supine: Max assist   General bed mobility comments: MaxAx1 and HOB elevated, slow and steady due to severe pain L hip; once at EOB patient became dizzy and reported she felt faint, BP WNL but HR 115-122BPM and symptoms worsening with extended sitting, returned to supine with MaxAx1  Transfers                 General transfer comment: unable to perform  Ambulation/Gait             General Gait Details: unable to perform   Stairs             Wheelchair Mobility    Modified Rankin (Stroke Patients Only)       Balance Overall balance assessment: Needs assistance Sitting-balance support: Bilateral upper extremity supported Sitting balance-Leahy Scale: Good Sitting balance - Comments: Pt sat EOB with minimal UE use - no loss of balance                                    Cognition Arousal/Alertness: Awake/alert Behavior During Therapy: WFL for tasks assessed/performed Overall Cognitive Status: Within Functional Limits for tasks assessed  General Comments: family present for this tx session      Exercises      General Comments        Pertinent Vitals/Pain Pain Assessment: Faces Faces Pain Scale: Hurts worst Pain Location: L hip with motion/activity Pain Descriptors / Indicators: Burning;Discomfort;Operative site guarding;Guarding Pain Intervention(s): Limited activity within patient's tolerance;Monitored during session;Repositioned    Home Living                      Prior Function            PT Goals (current goals can now be found in the care  plan section) Acute Rehab PT Goals Patient Stated Goal: to recover and get back home PT Goal Formulation: With patient/family Time For Goal Achievement: 07/21/19 Potential to Achieve Goals: Good Progress towards PT goals: Not progressing toward goals - comment(limited by symptomatic tachycardia at EOB)    Frequency    Min 3X/week      PT Plan Current plan remains appropriate    Co-evaluation              AM-PAC PT "6 Clicks" Mobility   Outcome Measure  Help needed turning from your back to your side while in a flat bed without using bedrails?: A Lot Help needed moving from lying on your back to sitting on the side of a flat bed without using bedrails?: A Lot Help needed moving to and from a bed to a chair (including a wheelchair)?: Total Help needed standing up from a chair using your arms (e.g., wheelchair or bedside chair)?: Total Help needed to walk in hospital room?: Total Help needed climbing 3-5 steps with a railing? : Total 6 Click Score: 8    End of Session   Activity Tolerance: Treatment limited secondary to medical complications (Comment)(HR elevation, feeling faint at EOB) Patient left: in bed;with family/visitor present;with call bell/phone within reach;with bed alarm set Nurse Communication: Other (comment)(elevated HR and coming close to passing out at EOB) PT Visit Diagnosis: Other abnormalities of gait and mobility (R26.89);Repeated falls (R29.6);Muscle weakness (generalized) (M62.81);History of falling (Z91.81);Difficulty in walking, not elsewhere classified (R26.2);Pain Pain - Right/Left: Left Pain - part of body: Hip     Time: 7096-2836 PT Time Calculation (min) (ACUTE ONLY): 38 min  Charges:  $Therapeutic Activity: 23-37 mins $Self Care/Home Management: 8-22                     Lauren Moreno PT, DPT, CBIS  Supplemental Physical Therapist [place]    Pager [phone] Acute Rehab Office [phone]

## 2019-07-08 NOTE — Telephone Encounter (Signed)
Follow up   Patient's son is calling to see how the monitor needs to be returned. Please call.

## 2019-07-08 NOTE — NC FL2 (Signed)
[place] LEVEL OF CARE SCREENING TOOL     IDENTIFICATION  Patient Name: Lauren Moreno Birthdate: 06/12/1928 Sex: female Admission Date (Current Location): 07/05/2019  [facility] and [place] Number:  Herbalist and Address:  [facility], [place] [address] [place], [place] [address]      Provider Number: [id number]  Attending Physician Name and Address:  Karie Kirks, DO  Relative Name and Phone Number:       Current Level of Care: Hospital Recommended Level of Care: [place] Prior Approval Number:    Date Approved/Denied:   PASRR Number: [id number]  Discharge Plan: SNF    Current Diagnoses: Patient Active Problem List   Diagnosis Date Noted  . Pain and swelling of left wrist 07/06/2019  . Abnormal CXR 07/06/2019  . Allergic rhinitis 07/06/2019  . Intertrochanteric fracture of left femur ([place]) 07/05/2019  . Weakness generalized   . Chronic venous insufficiency   . Fall   . Dehydration   . Near syncope 03/21/2015    Orientation RESPIRATION BLADDER Height & Weight     Self, Time, Situation, Place  Normal Continent, External catheter Weight: 186 lb 8.2 oz (84.6 kg) Height:  5\' 2"  (157.5 cm)  BEHAVIORAL SYMPTOMS/MOOD NEUROLOGICAL BOWEL NUTRITION STATUS      Continent Diet(see discharge summary)  AMBULATORY STATUS COMMUNICATION OF NEEDS Skin   Extensive Assist Verbally Surgical wounds(incision on left hip with foam dressing)                       Personal Care Assistance Level of Assistance  Bathing, Feeding, Dressing Bathing Assistance: Maximum assistance Feeding assistance: Independent Dressing Assistance: Maximum assistance     Functional Limitations Info  Speech, Sight, Hearing Sight Info: Adequate Hearing Info: Adequate Speech Info: Adequate    SPECIAL CARE FACTORS FREQUENCY  PT (By licensed PT), OT (By licensed OT)     PT Frequency: 5x week OT Frequency: 5x week             Contractures Contractures Info: Not present    Additional Factors Info  Code Status, Allergies Code Status Info: Full Code Allergies Info: Penicillins, Shrimp (Shellfish Allergy)           Current Medications (07/08/2019):  This is the current hospital active medication list Current Facility-Administered Medications  Medication Dose Route Frequency Provider Last Rate Last Dose  . 0.9 %  sodium chloride infusion  250 mL Intravenous PRN Swayze, Ava, DO      . acetaminophen (TYLENOL) tablet 325-650 mg  325-650 mg Oral Q6H PRN Prudencio Burly III, PA-C      . diphenhydrAMINE (BENADRYL) tablet 25 mg  25 mg Oral Q6H PRN Swayze, Ava, DO      . enoxaparin (LOVENOX) injection 40 mg  40 mg Subcutaneous Q24H Prudencio Burly III, PA-C   40 mg at 07/08/19 0809  . HYDROcodone-acetaminophen (NORCO/VICODIN) 5-325 MG per tablet 1 tablet  1 tablet Oral Q4H PRN Prudencio Burly III, PA-C   1 tablet at 07/08/19 1232  . HYDROmorphone (DILAUDID) injection 0.25-0.5 mg  0.25-0.5 mg Intravenous Q4H PRN Prudencio Burly III, PA-C      . metoCLOPramide (REGLAN) tablet 5-10 mg  5-10 mg Oral Q8H PRN Prudencio Burly III, PA-C       Or  . metoCLOPramide (REGLAN) injection 5-10 mg  5-10 mg Intravenous Q8H PRN Prudencio Burly III, PA-C      .  mirtazapine (REMERON) tablet 7.5 mg  7.5 mg Oral QHS PRN Swayze, Ava, DO      . ondansetron (ZOFRAN) tablet 4 mg  4 mg Oral Q6H PRN Prudencio Burly III, PA-C       Or  . ondansetron (ZOFRAN) injection 4 mg  4 mg Intravenous Q6H PRN Prudencio Burly III, PA-C         Discharge Medications: Please see discharge summary for a list of discharge medications.  Relevant Imaging Results:  Relevant Lab Results:   Additional Information SS#[id number] Lauren Moreno, [place]

## 2019-07-08 NOTE — TOC Progression Note (Signed)
Transition of Care [facility]) - Progression Note    Patient Details  Name: Lauren Moreno MRN: [id number] Date of Birth: [birth year]  Transition of Care [facility]) CM/SW [place], [place] Phone Number: 07/08/2019, 1:26 PM  Clinical Narrative:    CSW met with pt at bedside. Introduced self, role, reason for visit. Pt confirms she is from home alone. She has multiple children that assist as needed however they work and she is worried about going home alone. We discussed SNF recs by therapy teams and pt in agreement but states she is in a lot of pain and worried about timing of discharge. At this time CSW provided verbal support and encouraged pt to work with therapies as best she can and let the MD team decide when she is medically ready. Pt prefers [place] where she has previously been during another post op recovery. Pt requested CSW call pt daughter Bethena Roys.  CSW ran into [place] entering the unit. Introduced self, role, reason for visit. Pt daughter requests also that pt referral be sent to [facility]. CSW will send referrals and f/u with family when offers received.    Expected Discharge Plan: [place] Barriers to Discharge: Continued Medical Work up  Expected Discharge Plan and Services Expected Discharge Plan: [place] In-house Referral: Clinical Social Work Discharge Planning Services: CM Consult Post Acute Care Choice: [place] arrangements for the past 2 months: Single Family Home   Social Determinants of Health (SDOH) Interventions    Readmission Risk Interventions No flowsheet data found.

## 2019-07-08 NOTE — Telephone Encounter (Signed)
Stick ZIO patch monitor to the last page of the patients log book.  Place log book in orange and white box and tape shut.  Box has prepaid [employer] on the package,

## 2019-07-08 NOTE — Progress Notes (Signed)
PT Cancellation Note  Patient Details Name: MALINE VOSSEN MRN: [id number] DOB: Oct 06, 1927   Cancelled Treatment:    Reason Eval/Treat Not Completed: Pain limiting ability to participate patient and family requesting PT return in afternoon due to pain. Will attempt in PM.   Deniece Ree PT, DPT, CBIS  Supplemental Physical Therapist [facility]    Pager [phone] Acute Rehab Office [phone]

## 2019-07-09 ENCOUNTER — Telehealth: Payer: Self-pay | Admitting: Internal Medicine

## 2019-07-09 NOTE — TOC Progression Note (Signed)
Transition of Care [facility]) - Progression Note    Patient Details  Name: Lauren Moreno MRN: [id number] Date of Birth: 1928/08/12  Transition of Care [facility]) CM/SW [place], [place] Phone Number: 07/09/2019, 4:51 PM  Clinical Narrative:    CSW met with pt at bedside, pt working with therapy and deferred to her son Gene at bedside. Pt son Gene was provided with CMS lists. He prefers [facility] and pt does as well. Pt aware again that dc is up to MD. Pt requires another COVID screen; this has been ordered by MD.   Continue to follow, [facility] also following for medical stability.    Expected Discharge Plan: [place] Barriers to Discharge: Continued Medical Work up  Expected Discharge Plan and Services Expected Discharge Plan: [place] In-house Referral: Clinical Social Work Discharge Planning Services: CM Consult Post Acute Care Choice: Durable Medical Equipment, [place] Living arrangements for the past 2 months: Single Family Home  Social Determinants of Health (SDOH) Interventions    Readmission Risk Interventions Readmission Risk Prevention Plan 07/08/2019  Post Dischage Appt Not Complete  Appt Comments SNF placement  Medication Screening Complete  Transportation Screening Complete  Some recent data might be hidden

## 2019-07-09 NOTE — Care Management Important Message (Signed)
Important Message  Patient Details  Name: Lauren Moreno MRN: [id number] Date of Birth: [birth year]   Medicare Important Message Given:  Yes     Memory Argue 07/09/2019, 1:12 PM    PATIENT HAS GIVEN  SON  VERBAL PERMISSION  TO SIGN IM

## 2019-07-09 NOTE — Telephone Encounter (Signed)
REceived call from hospitalist   Pt broke leg  Fell   Wearing monitor at the time Pt and physician wonder if monitor had any arrhythmia on it  NOt sure what type she is wearing   Please try to find out info

## 2019-07-09 NOTE — Progress Notes (Signed)
Physical Therapy Treatment Patient Details Name: Lauren Moreno MRN: [id number] DOB: 12-23-1927 Today's Date: 07/09/2019    History of Present Illness Lauren Moreno  is a 85 y.o. female, w hypertension, hyperlipidemia, thyroid disease, apparently sufferred mechanical fall and c/o left hip pain.  Pt apparently fell backwards, and hit her head, There was no syncope per her family.  Pt had ORIF left femur on 07-06-2019.    PT Comments    Patient received in bed, family present and reassured her for participation in therapy today, continues to remain anxious and limited by pain, screaming out in pain even when PT was only taking off SCD, putting sock on her foot, and on one occasion when PT was adjusting bed and not even touching patient. She required maxAx2 for supine to sit and MaxA to fully position self at EOB in preparation for sit to stand. Able to perform partial stand in stedy with MaxAx2, unable to extend hips fully to come to full stand. Also attempted with RW, unable to even raise hips off of bed with maxAx2 using this device. Returned to bed with maxAx2 and positioned to comfort with all needs met and bed alarm active this afternoon. Continue to strongly recommend SNF moving forward.     Follow Up Recommendations  SNF;Supervision/Assistance - 24 hour     Equipment Recommendations  None recommended by PT    Recommendations for Other Services       Precautions / Restrictions Precautions Precautions: Fall Precaution Comments: Pt reports she has had 3 falls this year Restrictions Weight Bearing Restrictions: Yes LLE Weight Bearing: Weight bearing as tolerated    Mobility  Bed Mobility Overal bed mobility: Needs Assistance Bed Mobility: Supine to Sit;Sit to Supine     Supine to sit: Max assist;+2 for physical assistance Sit to supine: Max assist;+2 for physical assistance   General bed mobility comments: MaxAx2 for safety and to pivot to EOB/bring trunk and legs around,  limited by pain  Transfers Overall transfer level: Needs assistance Equipment used: Rolling walker (2 wheeled) Transfers: Sit to/from Stand Sit to Stand: Max assist;+2 physical assistance         General transfer comment: MaxAx2 for partial stand in stedy, unable to clear hips to a fully extended position; attempted with RW due to L shoulder pain and unable to even clear hips from bed with MaxAx2, minimal effort noted from patient on second attempt  Ambulation/Gait             General Gait Details: unable to perform   Stairs             Wheelchair Mobility    Modified Rankin (Stroke Patients Only)       Balance Overall balance assessment: Needs assistance Sitting-balance support: Bilateral upper extremity supported Sitting balance-Leahy Scale: Fair Sitting balance - Comments: tends to lean posteriorly     Standing balance-Leahy Scale: Zero                              Cognition Arousal/Alertness: Awake/alert Behavior During Therapy: WFL for tasks assessed/performed;Anxious Overall Cognitive Status: Within Functional Limits for tasks assessed                                 General Comments: family present for this tx session      Exercises      General Comments General  comments (skin integrity, edema, etc.): VSS during session      Pertinent Vitals/Pain Pain Assessment: Faces Faces Pain Scale: Hurts worst Pain Location: L hip with motion/activity Pain Descriptors / Indicators: Burning;Discomfort;Operative site guarding;Guarding Pain Intervention(s): Limited activity within patient's tolerance;Monitored during session;Premedicated before session    Home Living                      Prior Function            PT Goals (current goals can now be found in the care plan section) Acute Rehab PT Goals Patient Stated Goal: to recover and get back home PT Goal Formulation: With patient/family Time For Goal  Achievement: 07/21/19 Potential to Achieve Goals: Good Progress towards PT goals: Not progressing toward goals - comment(limited by pain)    Frequency    Min 3X/week      PT Plan Current plan remains appropriate    Co-evaluation              AM-PAC PT "6 Clicks" Mobility   Outcome Measure  Help needed turning from your back to your side while in a flat bed without using bedrails?: A Lot Help needed moving from lying on your back to sitting on the side of a flat bed without using bedrails?: A Lot Help needed moving to and from a bed to a chair (including a wheelchair)?: Total Help needed standing up from a chair using your arms (e.g., wheelchair or bedside chair)?: Total Help needed to walk in hospital room?: Total Help needed climbing 3-5 steps with a railing? : Total 6 Click Score: 8    End of Session Equipment Utilized During Treatment: Gait belt Activity Tolerance: Patient limited by pain Patient left: in bed;with family/visitor present;with call bell/phone within reach;with bed alarm set   PT Visit Diagnosis: Other abnormalities of gait and mobility (R26.89);Repeated falls (R29.6);Muscle weakness (generalized) (M62.81);History of falling (Z91.81);Difficulty in walking, not elsewhere classified (R26.2);Pain Pain - Right/Left: Left Pain - part of body: Hip     Time: 5625-6389 PT Time Calculation (min) (ACUTE ONLY): 38 min  Charges:  $Therapeutic Activity: 38-52 mins                     Deniece Ree PT, DPT, CBIS  Supplemental Physical Therapist [place]    Pager [phone] Acute Rehab Office [phone]

## 2019-07-09 NOTE — Telephone Encounter (Signed)
A Zio XT monitor was ordered at ov with Dr. Harrington Challenger on 9/21.  This is holter style and NOT LIVE.   It has not been returned yet.

## 2019-07-09 NOTE — Telephone Encounter (Signed)
I spoke with patient's daughter and explained the Zio XT (holter style) monitor was ordered for 14 days and is not live monitoring.  Pt is scheduled to wear monitor until this Friday.

## 2019-07-09 NOTE — Telephone Encounter (Signed)
Pt's daughter had also called in (See previous telephone encounter 10/5).  I adv her that the monitor is not accessible yet since it was not live recording.  The pt is wearing the monitor until this Friday.  She has the box to return it but currently she is not the designated visitor.  Her sister who lives out of town is.  Daughter stated the pt is anxious and having pain and she could spend more time with pt than out of town sister can.  I adv her to speak with hospital staff re: this but reinforced the visitor rules and reason for them.

## 2019-07-09 NOTE — TOC Progression Note (Signed)
Transition of Care [facility]) - Progression Note    Patient Details  Name: Lauren Moreno MRN: [id number] Date of Birth: 05-Apr-1928  Transition of [facility]) CM/SW [place], [place] Phone Number: 07/09/2019, 11:30 AM  Clinical Narrative:    Continue to follow, both pt preferred SNFs have offered. However she was unable to work with therapy yesterday due to pain. Following for continued therapy evals. Will provide CMS list to pt and place on chart.    Expected Discharge Plan: [place] Barriers to Discharge: Continued Medical Work up  Expected Discharge Plan and Services Expected Discharge Plan: [place] In-house Referral: Clinical Social Work Discharge Planning Services: CM Consult Post Acute Care Choice: [place] Living arrangements for the past 2 months: Single Family Home   Social Determinants of Health (SDOH) Interventions    Readmission Risk Interventions Readmission Risk Prevention Plan 07/08/2019  Post Dischage Appt Not Complete  Appt Comments SNF placement  Medication Screening Complete  Transportation Screening Complete  Some recent data might be hidden

## 2019-07-09 NOTE — Progress Notes (Signed)
    Subjective: Slow mobilization progress.  SNF placement in process.  Objective:   VITALS:   Vitals:   07/08/19 0618 07/08/19 1530 07/08/19 2014 07/09/19 0532  BP:  (!) 114/53 (!) 113/40 (!) 127/57  Pulse:  76 92 98  Resp:  18    Temp:  97.6 F (36.4 C) 98.1 F (36.7 C) 98.2 F (36.8 C)  TempSrc:  Oral Oral Oral  SpO2:  95% 93% 94%  Weight: 84.6 kg     Height:       CBC Latest Ref Rng & Units 07/07/2019 07/06/2019 07/05/2019  WBC 4.0 - 10.5 K/uL 11.9(H) 12.3(H) 14.3(H)  Hemoglobin 12.0 - 15.0 g/dL 11.2(L) 12.8 13.9  Hematocrit 36.0 - 46.0 % 33.8(L) 38.4 41.9  Platelets 150 - 400 K/uL 173 190 189   BMP Latest Ref Rng & Units 07/07/2019 07/06/2019 07/05/2019  Glucose 70 - 99 mg/dL 119(H) 118(H) 121(H)  BUN 8 - 23 mg/dL 18 15 17   Creatinine 0.44 - 1.00 mg/dL 0.66 0.52 0.80  Sodium 135 - 145 mmol/L 137 138 136  Potassium 3.5 - 5.1 mmol/L 3.7 3.8 3.6  Chloride 98 - 111 mmol/L 104 106 101  CO2 22 - 32 mmol/L 23 24 23   Calcium 8.9 - 10.3 mg/dL 8.9 8.7(L) 8.7(L)   Intake/Output      10/06 0701 - 10/07 0700 10/07 0701 - 10/08 0700   P.O. 500 360   Total Intake(mL/kg) 500 (5.9) 360 (4.3)   Urine (mL/kg/hr) 650 (0.3)    Total Output 650    Net -150 +360        Urine Occurrence 2 x      Assessment: 3 Days Post-Op  S/P Procedure(s) (LRB): INTRAMEDULLARY (IM) NAIL LEFT HIP (Left) by Dr. Ernesta Amble. Percell Miller on 07/06/2019  Principal Problem:   Intertrochanteric fracture of left femur ([place]) Active Problems:   Pain and swelling of left wrist   Abnormal CXR   Allergic rhinitis   Closed comminuted left intertrochanteric femur fracture, status post cephalo-medullary IM nail Slow mobilization  Plan: Up with therapy Incentive Spirometry Apply ice prn Continue care for medical conditions per primary team.  Weightbearing: WBAT LLE Insicional and dressing care: Dressings left intact until follow-up Showering: Keep dressing dry VTE prophylaxis: Lovenox, SCDs, ambulation Pain  control: Tylenol.  Minimize narcotics.  Continue current regimen. Follow - up plan: 2 weeks in the office with Dr. Leamon Arnt information:  [name] MD, Roxan Hockey PA-C  Dispo:  Therapy evaluations ongoing.  SNF recommended.  The patient lives alone.  Discharge when mobilized and ready medically.  Please call with questions.   Prudencio Burly III, PA-C 07/09/2019, 12:53 PM

## 2019-07-09 NOTE — Progress Notes (Signed)
PROGRESS NOTE  [name] [id number] DOB: 1928-04-06 DOA: 07/05/2019 PCP: Lauren Pali, NP  Brief History   Lauren Moreno  is a 85 y.o. female, w hypertension, hyperlipidemia, thyroid disease, apparently sufferred mechanical fall and c/o left hip pain.  Pt apparently fell backwards, and hit her head, There was no syncope per her family.   In the ED the patient was found to have an acute mildly comminuted displaced and angulated intertrochanteric fracture of the left hip. CT of the head demonstrated no skull fracture or intracranial hemorrhage, no c-spine fracture or subluxation, and stable moderate diffuse cerebral and cerebellar atrophy and mild -moderate chronic small vessel white matter ischemic changes in both cerebral hemispheres. Mild cervical spine degenerative changes. CXR demonstrated minimal changes of congestive heart failure with chronic interstitial lung disease.   The patient  Underwent ORIF of her left hip on 07/06/2019. She has tolerated the procedure well.She awaits SNF placement.  Consultants  . Orthopedic surgery  Procedures  . ORIF left hip  Antibiotics   Anti-infectives (From admission, onward)   Start     Dose/Rate Route Frequency Ordered Stop   07/06/19 2200  clindamycin (CLEOCIN) IVPB 600 mg     600 mg 100 mL/hr over 30 Minutes Intravenous Every 6 hours 07/06/19 1751 07/07/19 0600   07/06/19 0900  clindamycin (CLEOCIN) IVPB 900 mg     900 mg 100 mL/hr over 30 Minutes Intravenous To Short Stay 07/06/19 0840 07/06/19 1557      Subjective  The patient is complaining of pain in her left leg. No new complaints. She is wondering if she had a cardiac event that may have resulted in her fall on 07/05/2019. I contacted Dr. Harrington Challenger on 07/08/2019. Dr. Harrington Challenger responded through Epic that she needed to know the type of the devise. I will call again today.  Objective   Vitals:  Vitals:   07/09/19 0532 07/09/19 1441  BP: (!) 127/57 (!) 117/54  Pulse: 98 94  Resp:   18  Temp: 98.2 F (36.8 C) 98.2 F (36.8 C)  SpO2: 94% 95%   Exam:  Constitutional:  . The patient is awake, alert, and oriented x 3. No acute distress. Marland Kitchen  Respiratory:  . No increased work of breathing. . No wheezes, rales, or rhonchi . No tactile fremitus Cardiovascular:  . Regular rate and rhythm . No murmurs, ectopy, or gallups. . No lateral PMI. No thrills. Abdomen:  . Abdomen is soft, non-tender, non-distended . No hernias, masses, or organomegaly . Normoactive bowel sounds.  Musculoskeletal:  . No cyanosis, clubbing, or edema Skin:  . No rashes, lesions, ulcers . palpation of skin: no induration or nodules Neurologic:  . CN 2-12 intact . Sensation all 4 extremities intact Psychiatric:  . Mental status o Mood, affect appropriate o Orientation to person, place, time  . judgment and insight appear intact  I have personally reviewed the following:   Today's Data  . Vitals, CMP, CBC, BNP, Troponin  Scheduled Meds: . enoxaparin (LOVENOX) injection  40 mg Subcutaneous Q24H   Continuous Infusions: . sodium chloride      Principal Problem:   Intertrochanteric fracture of left femur (HCC) Active Problems:   Pain and swelling of left wrist   Abnormal CXR   Allergic rhinitis   LOS: 4 days   A & P  L intertrochanteric hip fracture: S/P ORIF of left hip on 07/06/2019.  L wrist swelling: Xray ordered, pt refused. Appears improved.  Evidence of CHF on CXR:  BNP mildly increased. No respiratory distress. No history of CHF. Echocardiogram was performed today that demonstrated Q000111Q with diastolic dysfunction. The patient appears well compenstated. Monitor volume status. Troponin negative. The patient has a cardiac event monitor. She is wondering if she had a cardiac event that may have resulted in her fall on 07/05/2019. I have called her cardiologist, Dr. Harrington Challenger to see if there was any suspicious cardiac activity recorded on 07/05/2019. Dr. Harrington Challenger stated that she  needed to know the type of device. It appears to be a ZIO brand monitor patch device.  Hypertension: Blood pressures are well controlled. She is on no antihypertensives at home.   Insomnia at times: Continue Remeron 7.5mg  po qhs, prn insomnia.  Hx of Allerghic rhinitis: Cont benadryl prn   I have seen and examined this patient myself. I have spent 30 minutes in her evaluation and care.  DVT Prophylaxis:  SCDs. Defer to orthopedics regarding DVT prophylaxis. Family Communication: Daughter at bedside. Code Status:  FULL CODE per pt, son present at bedside Disposition: SNF  Cira Deyoe, DO [facility] Direct contact: see [email or web address]  7PM-7AM contact night coverage as above 07/09/2019, 4:47 PM  LOS: 1 day

## 2019-07-09 NOTE — Telephone Encounter (Signed)
New Message    Patient's daughter called back wanting to know if the heart monitor can be read to see if there were any indications as to why her mother fell on Saturday.

## 2019-07-10 LAB — CBC WITH DIFFERENTIAL/PLATELET
Abs Immature Granulocytes: 0.09 10*3/uL — ABNORMAL HIGH (ref 0.00–0.07)
Basophils Absolute: 0.1 10*3/uL (ref 0.0–0.1)
Basophils Relative: 1 %
Eosinophils Absolute: 0.5 10*3/uL (ref 0.0–0.5)
Eosinophils Relative: 4 %
HCT: 34.9 % — ABNORMAL LOW (ref 36.0–46.0)
Hemoglobin: 11.5 g/dL — ABNORMAL LOW (ref 12.0–15.0)
Immature Granulocytes: 1 %
Lymphocytes Relative: 19 %
Lymphs Abs: 1.9 10*3/uL (ref 0.7–4.0)
MCH: 31.3 pg (ref 26.0–34.0)
MCHC: 33 g/dL (ref 30.0–36.0)
MCV: 95.1 fL (ref 80.0–100.0)
Monocytes Absolute: 1.3 10*3/uL — ABNORMAL HIGH (ref 0.1–1.0)
Monocytes Relative: 13 %
Neutro Abs: 6.5 10*3/uL (ref 1.7–7.7)
Neutrophils Relative %: 62 %
Platelets: 180 10*3/uL (ref 150–400)
RBC: 3.67 MIL/uL — ABNORMAL LOW (ref 3.87–5.11)
RDW: 13.8 % (ref 11.5–15.5)
WBC: 10.3 10*3/uL (ref 4.0–10.5)
nRBC: 0 % (ref 0.0–0.2)

## 2019-07-10 LAB — BASIC METABOLIC PANEL
Anion gap: 11 (ref 5–15)
BUN: 15 mg/dL (ref 8–23)
CO2: 24 mmol/L (ref 22–32)
Calcium: 8.6 mg/dL — ABNORMAL LOW (ref 8.9–10.3)
Chloride: 103 mmol/L (ref 98–111)
Creatinine, Ser: 0.52 mg/dL (ref 0.44–1.00)
GFR calc Af Amer: >60 mL/min (ref >60)
GFR calc non Af Amer: >60 mL/min (ref >60)
Glucose, Bld: 121 mg/dL — ABNORMAL HIGH (ref 70–99)
Potassium: 3.9 mmol/L (ref 3.5–5.1)
Sodium: 138 mmol/L (ref 135–145)

## 2019-07-10 NOTE — TOC Progression Note (Signed)
Transition of Care [facility]) - Progression Note    Patient Details  Name: Lauren Moreno MRN: [id number] Date of Birth: 1928-05-25  Transition of Care [facility]) CM/SW [place], [place] Phone Number: 07/10/2019, 12:43 PM  Clinical Narrative:    Pt requires another COVID screen; this has been ordered by MD.  RN is aware- will complete swab.  Continue to follow, [place] also following for medical stability.     Expected Discharge Plan: [place] Barriers to Discharge: Continued Medical Work up  Expected Discharge Plan and Services Expected Discharge Plan: [place] In-house Referral: Clinical Social Work Discharge Planning Services: CM Consult Post Acute Care Choice: Durable Medical Equipment, [place] Living arrangements for the past 2 months: Single Family Home   Social Determinants of Health (SDOH) Interventions    Readmission Risk Interventions Readmission Risk Prevention Plan 07/08/2019  Post Dischage Appt Not Complete  Appt Comments SNF placement  Medication Screening Complete  Transportation Screening Complete  Some recent data might be hidden

## 2019-07-10 NOTE — Progress Notes (Signed)
PROGRESS NOTE  Lauren Moreno [id number] DOB: [birth year] DOA: 07/05/2019 PCP: Philmore Pali, NP  Brief History   Lauren Moreno  is a 85 y.o. female, w hypertension, hyperlipidemia, thyroid disease, apparently sufferred mechanical fall and c/o left hip pain.  Pt apparently fell backwards, and hit her head, There was no syncope per her family.   In the ED the patient was found to have an acute mildly comminuted displaced and angulated intertrochanteric fracture of the left hip. CT of the head demonstrated no skull fracture or intracranial hemorrhage, no c-spine fracture or subluxation, and stable moderate diffuse cerebral and cerebellar atrophy and mild -moderate chronic small vessel white matter ischemic changes in both cerebral hemispheres. Mild cervical spine degenerative changes. CXR demonstrated minimal changes of congestive heart failure with chronic interstitial lung disease.   The patient  Underwent ORIF of her left hip on 07/06/2019. She has tolerated the procedure well. Repeat COVID-19 has been ordered.She awaits SNF placement.  Consultants  . Orthopedic surgery  Procedures  . ORIF left hip  Antibiotics   Anti-infectives (From admission, onward)   Start     Dose/Rate Route Frequency Ordered Stop   07/06/19 2200  clindamycin (CLEOCIN) IVPB 600 mg     600 mg 100 mL/hr over 30 Minutes Intravenous Every 6 hours 07/06/19 1751 07/07/19 0600   07/06/19 0900  clindamycin (CLEOCIN) IVPB 900 mg     900 mg 100 mL/hr over 30 Minutes Intravenous To Short Stay 07/06/19 0840 07/06/19 1557      Subjective  The patient is complaining of pain in her left leg. No new complaints.   Objective   Vitals:  Vitals:   07/10/19 1029 07/10/19 1529  BP: (!) 114/48 (!) 111/53  Pulse: 98 96  Resp: 18 18  Temp: 98.2 F (36.8 C) 97.6 F (36.4 C)  SpO2: 96% 97%   Exam:  Constitutional:  . The patient is awake, alert, and oriented x 3. No acute distress. Respiratory:  . No increased  work of breathing. . No wheezes, rales, or rhonchi . No tactile fremitus Cardiovascular:  . Regular rate and rhythm . No murmurs, ectopy, or gallups. . No lateral PMI. No thrills. Abdomen:  . Abdomen is soft, non-tender, non-distended . No hernias, masses, or organomegaly . Normoactive bowel sounds.  Musculoskeletal:  . No cyanosis, clubbing, or edema Skin:  . No rashes, lesions, ulcers . palpation of skin: no induration or nodules Neurologic:  . CN 2-12 intact . Sensation all 4 extremities intact Psychiatric:  . Mental status o Mood, affect appropriate o Orientation to person, place, time  . judgment and insight appear intact  I have personally reviewed the following:   Today's Data  . Vitals, CMP, CBC, BNP, Troponin  Scheduled Meds: . enoxaparin (LOVENOX) injection  40 mg Subcutaneous Q24H   Continuous Infusions: . sodium chloride      Principal Problem:   Intertrochanteric fracture of left femur (HCC) Active Problems:   Pain and swelling of left wrist   Abnormal CXR   Allergic rhinitis   LOS: 5 days   A & P  L intertrochanteric hip fracture: S/P ORIF of left hip on 07/06/2019.  L wrist swelling: Xray ordered, pt refused. Resolved.  Evidence of CHF on CXR: BNP mildly increased. No respiratory distress. No history of CHF. Echocardiogram was performed today that demonstrated Q000111Q with diastolic dysfunction. The patient appears well compenstated. Monitor volume status. Troponin negative. The patient has a cardiac event monitor. She is wondering if  she had a cardiac event that may have resulted in her fall on 07/05/2019.  Hypertension: Blood pressures are well controlled. She is on no antihypertensives at home.   Insomnia at times: Continue Remeron 7.5mg  po qhs, prn insomnia.  Hx of Allerghic rhinitis: Cont benadryl prn   I have seen and examined this patient myself. I have spent 32 minutes in her evaluation and care.  DVT Prophylaxis:  SCDs. Defer to  orthopedics regarding DVT prophylaxis. Family Communication: Daughter at bedside. Code Status:  FULL CODE per pt, son present at bedside Disposition: SNF  Tamsen Reist, DO [facility] Direct contact: see [email or web address]  7PM-7AM contact night coverage as above 07/10/2019, 3:58 PM  LOS: 1 day

## 2019-07-10 NOTE — Progress Notes (Signed)
Physical Therapy Treatment Patient Details Name: Lauren Moreno MRN: [id number] DOB: Apr 08, 1928 Today's Date: 07/10/2019    History of Present Illness Lauren Moreno  is a 85 y.o. female admitted after fall with left hip pain s/p ORIF femur 10/4. PMHx: HTN, HLD, thyroid disease    PT Comments    Pt supine on arrival receiving medication. Son, Gene, present throughout session. Pt able to pivot to EOB, stand, pivot to [facility] and sit in recliner with HEP education and performance end of session. Son educated for HEP, transfer sequence and progression with pt encouraged to perform HEP throughout the day and continue to assist with all mobility. D/C plan remains appropriate and will continue to follow.  Pt benefits from cues for slow deep breaths and encouragement throughout session.     Follow Up Recommendations  SNF;Supervision/Assistance - 24 hour     Equipment Recommendations  None recommended by PT    Recommendations for Other Services       Precautions / Restrictions Precautions Precautions: Fall Restrictions LLE Weight Bearing: Weight bearing as tolerated    Mobility  Bed Mobility Overal bed mobility: Needs Assistance Bed Mobility: Supine to Sit     Supine to sit: Mod assist;+2 for physical assistance;HOB elevated     General bed mobility comments: HOb 30 degrees with cues and assist to pivot legs to EOB, elevate trunk and scoot to EOB with increased time  Transfers Overall transfer level: Needs assistance   Transfers: Sit to/from Stand;Stand Pivot Transfers Sit to Stand: Mod assist;+2 physical assistance;From elevated surface Stand pivot transfers: Max assist;+2 physical assistance       General transfer comment: mod +2 assist to rise from bed with RW present with pt maintaining flexed posture. Max assist for small sequential steps to pivot to [facility] with RW present. Mod +2 to stand from [facility] with pt able to take 2 very short shuffling steps less than 6" with BSC  pulled out from behind pt and bed moved under her  Ambulation/Gait             General Gait Details: unable to perform   Stairs             Wheelchair Mobility    Modified Rankin (Stroke Patients Only)       Balance Overall balance assessment: Needs assistance   Sitting balance-Leahy Scale: Fair Sitting balance - Comments: sitting EOB and on bSC with minguard   Standing balance support: Bilateral upper extremity supported Standing balance-Leahy Scale: Poor Standing balance comment: bil UE support and physical assist in standing with max cues for trunk and hip extension                            Cognition Arousal/Alertness: Awake/alert Behavior During Therapy: Flat affect Overall Cognitive Status: Impaired/Different from baseline Area of Impairment: Following commands;Problem solving                       Following Commands: Follows one step commands inconsistently     Problem Solving: Slow processing        Exercises General Exercises - Lower Extremity Long Arc Quad: AAROM;Left;Seated;10 reps Hip Flexion/Marching: AAROM;Left;Seated;10 reps    General Comments        Pertinent Vitals/Pain Pain Score: 2  Pain Location: L hip with motion/activity Pain Descriptors / Indicators: Sore Pain Intervention(s): Premedicated before session;Monitored during session;Limited activity within patient's tolerance;Repositioned    Home Living  Prior Function            PT Goals (current goals can now be found in the care plan section) Progress towards PT goals: Progressing toward goals    Frequency    Min 3X/week      PT Plan Current plan remains appropriate    Co-evaluation              AM-PAC PT "6 Clicks" Mobility   Outcome Measure  Help needed turning from your back to your side while in a flat bed without using bedrails?: A Lot Help needed moving from lying on your back to sitting on  the side of a flat bed without using bedrails?: A Lot Help needed moving to and from a bed to a chair (including a wheelchair)?: Total Help needed standing up from a chair using your arms (e.g., wheelchair or bedside chair)?: Total Help needed to walk in hospital room?: Total Help needed climbing 3-5 steps with a railing? : Total 6 Click Score: 8    End of Session Equipment Utilized During Treatment: Gait belt Activity Tolerance: Patient tolerated treatment well Patient left: in chair;with call bell/phone within reach;with chair alarm set;with family/visitor present Nurse Communication: Mobility status;Precautions;Weight bearing status(stand with 2 person assist and switch surfaces behind pt for return to bed) PT Visit Diagnosis: Other abnormalities of gait and mobility (R26.89);Repeated falls (R29.6);Muscle weakness (generalized) (M62.81);History of falling (Z91.81);Difficulty in walking, not elsewhere classified (R26.2);Pain     Time: [id number] PT Time Calculation (min) (ACUTE ONLY): 34 min  Charges:  $Therapeutic Activity: 23-37 mins                     [facility], PT Acute Rehabilitation Services Pager: [phone] Office: [place] 07/10/2019, 2:38 PM

## 2019-07-11 DIAGNOSIS — I491 Atrial premature depolarization: Secondary | ICD-10-CM | POA: Diagnosis not present

## 2019-07-11 DIAGNOSIS — S72142A Displaced intertrochanteric fracture of left femur, initial encounter for closed fracture: Secondary | ICD-10-CM | POA: Diagnosis not present

## 2019-07-11 DIAGNOSIS — R110 Nausea: Secondary | ICD-10-CM | POA: Diagnosis not present

## 2019-07-11 DIAGNOSIS — R0789 Other chest pain: Secondary | ICD-10-CM | POA: Diagnosis not present

## 2019-07-11 DIAGNOSIS — Z7401 Bed confinement status: Secondary | ICD-10-CM | POA: Diagnosis not present

## 2019-07-11 DIAGNOSIS — R52 Pain, unspecified: Secondary | ICD-10-CM | POA: Diagnosis not present

## 2019-07-11 DIAGNOSIS — R001 Bradycardia, unspecified: Secondary | ICD-10-CM | POA: Diagnosis not present

## 2019-07-11 DIAGNOSIS — I959 Hypotension, unspecified: Secondary | ICD-10-CM | POA: Diagnosis not present

## 2019-07-11 DIAGNOSIS — K5900 Constipation, unspecified: Secondary | ICD-10-CM | POA: Diagnosis not present

## 2019-07-11 DIAGNOSIS — R279 Unspecified lack of coordination: Secondary | ICD-10-CM | POA: Diagnosis not present

## 2019-07-11 DIAGNOSIS — I499 Cardiac arrhythmia, unspecified: Secondary | ICD-10-CM | POA: Diagnosis not present

## 2019-07-11 DIAGNOSIS — Z743 Need for continuous supervision: Secondary | ICD-10-CM | POA: Diagnosis not present

## 2019-07-11 DIAGNOSIS — R2681 Unsteadiness on feet: Secondary | ICD-10-CM | POA: Diagnosis not present

## 2019-07-11 DIAGNOSIS — M2550 Pain in unspecified joint: Secondary | ICD-10-CM | POA: Diagnosis not present

## 2019-07-11 DIAGNOSIS — Z7982 Long term (current) use of aspirin: Secondary | ICD-10-CM | POA: Diagnosis not present

## 2019-07-11 DIAGNOSIS — W19XXXD Unspecified fall, subsequent encounter: Secondary | ICD-10-CM | POA: Diagnosis not present

## 2019-07-11 DIAGNOSIS — Z9181 History of falling: Secondary | ICD-10-CM | POA: Diagnosis not present

## 2019-07-11 DIAGNOSIS — Z4789 Encounter for other orthopedic aftercare: Secondary | ICD-10-CM | POA: Diagnosis not present

## 2019-07-11 DIAGNOSIS — M25552 Pain in left hip: Secondary | ICD-10-CM | POA: Diagnosis not present

## 2019-07-11 DIAGNOSIS — S72142D Displaced intertrochanteric fracture of left femur, subsequent encounter for closed fracture with routine healing: Secondary | ICD-10-CM | POA: Diagnosis not present

## 2019-07-11 DIAGNOSIS — R41841 Cognitive communication deficit: Secondary | ICD-10-CM | POA: Diagnosis not present

## 2019-07-11 DIAGNOSIS — J309 Allergic rhinitis, unspecified: Secondary | ICD-10-CM | POA: Diagnosis not present

## 2019-07-11 DIAGNOSIS — M25532 Pain in left wrist: Secondary | ICD-10-CM | POA: Diagnosis not present

## 2019-07-11 DIAGNOSIS — W19XXXA Unspecified fall, initial encounter: Secondary | ICD-10-CM

## 2019-07-11 DIAGNOSIS — M6281 Muscle weakness (generalized): Secondary | ICD-10-CM | POA: Diagnosis not present

## 2019-07-11 DIAGNOSIS — R58 Hemorrhage, not elsewhere classified: Secondary | ICD-10-CM | POA: Diagnosis not present

## 2019-07-11 DIAGNOSIS — Z5189 Encounter for other specified aftercare: Secondary | ICD-10-CM | POA: Diagnosis not present

## 2019-07-11 DIAGNOSIS — R112 Nausea with vomiting, unspecified: Secondary | ICD-10-CM | POA: Diagnosis not present

## 2019-07-11 DIAGNOSIS — G4700 Insomnia, unspecified: Secondary | ICD-10-CM | POA: Diagnosis not present

## 2019-07-11 DIAGNOSIS — E079 Disorder of thyroid, unspecified: Secondary | ICD-10-CM | POA: Diagnosis not present

## 2019-07-11 DIAGNOSIS — M25539 Pain in unspecified wrist: Secondary | ICD-10-CM

## 2019-07-11 DIAGNOSIS — I10 Essential (primary) hypertension: Secondary | ICD-10-CM | POA: Diagnosis not present

## 2019-07-11 DIAGNOSIS — R278 Other lack of coordination: Secondary | ICD-10-CM | POA: Diagnosis not present

## 2019-07-11 DIAGNOSIS — R4181 Age-related cognitive decline: Secondary | ICD-10-CM | POA: Diagnosis not present

## 2019-07-11 DIAGNOSIS — R5381 Other malaise: Secondary | ICD-10-CM | POA: Diagnosis not present

## 2019-07-11 DIAGNOSIS — R9389 Abnormal findings on diagnostic imaging of other specified body structures: Secondary | ICD-10-CM | POA: Diagnosis not present

## 2019-07-11 DIAGNOSIS — E785 Hyperlipidemia, unspecified: Secondary | ICD-10-CM | POA: Diagnosis not present

## 2019-07-11 DIAGNOSIS — Z7901 Long term (current) use of anticoagulants: Secondary | ICD-10-CM | POA: Diagnosis not present

## 2019-07-11 DIAGNOSIS — Z20828 Contact with and (suspected) exposure to other viral communicable diseases: Secondary | ICD-10-CM | POA: Diagnosis not present

## 2019-07-11 LAB — NOVEL CORONAVIRUS, NAA (HOSP ORDER, SEND-OUT TO REF LAB; TAT 18-24 HRS): SARS-CoV-2, NAA: NOT DETECTED

## 2019-07-11 MED ORDER — POLYETHYLENE GLYCOL 3350 17 G PO PACK: 17.0000 g | PACK | Freq: Every day | ORAL | 0 refills | Status: DC

## 2019-07-11 MED ORDER — ACETAMINOPHEN 500 MG PO TABS: 500.0000 mg | ORAL_TABLET | Freq: Four times a day (QID) | ORAL | 0 refills | Status: DC | PRN

## 2019-07-11 MED ORDER — ONDANSETRON HCL 4 MG PO TABS: 4.0000 mg | ORAL_TABLET | Freq: Four times a day (QID) | ORAL | 0 refills | Status: DC | PRN

## 2019-07-11 MED ORDER — MIRTAZAPINE 7.5 MG PO TABS: 7.5000 mg | ORAL_TABLET | Freq: Every evening | ORAL | 1 refills | Status: DC | PRN

## 2019-07-11 NOTE — Progress Notes (Signed)
Report given to Claiborne Billings, Therapist, sports at [facility].

## 2019-07-11 NOTE — Plan of Care (Signed)

## 2019-07-11 NOTE — Progress Notes (Signed)
Discharge to [facility], transported by [facility].

## 2019-07-11 NOTE — Discharge Instructions (Signed)
You were cared for by a hospitalist during your hospital stay. If you have any questions about your discharge medications or the care you received while you were in the hospital after you are discharged, you can call the unit and ask to speak with the hospitalist on call if the hospitalist that took care of you is not available. Once you are discharged, your primary care physician will handle any further medical issues. Please note that NO REFILLS for any discharge medications will be authorized once you are discharged, as it is imperative that you return to your primary care physician (or establish a relationship with a primary care physician if you do not have one) for your aftercare needs so that they can reassess your need for medications and monitor your lab values.  Follow up with PCP and orthopedic surgeon Dr. Alain Marion all medications as prescribed. If symptoms change or worsen please return to the ED for evaluation

## 2019-07-11 NOTE — Discharge Summary (Signed)
Physician Discharge Summary  [name] [id number] DOB: 07-06-1928 DOA: 07/05/2019  PCP: Philmore Pali, NP  Admit date: 07/05/2019 Discharge date: 07/11/2019  Time spent: 36 minutes  Recommendations for Outpatient Follow-up:  1. Check BMP in 7 days 2. Please follow up with PCP 10-14 days 3. Repeat BMP at follow up visit 4. Please follow up with Dr. Percell Miller of ortho  Discharge Diagnoses:  Principal Problem:   Intertrochanteric fracture of left femur [facility]) Active Problems:   Pain and swelling of left wrist   Abnormal CXR   Allergic rhinitis   Discharge Condition: stable  Diet recommendation: low salt diet  Filed Weights   07/07/19 2255 07/08/19 0618 07/11/19 0442  Weight: 83.1 kg 84.6 kg 80.7 kg    History of present illness:   ElaineFlinchumis a85 y.o.female,w hypertension, hyperlipidemia, thyroid disease, apparently sufferred mechanical fall and c/o left hip pain. Pt apparently fell backwards, and hit her head, There was no syncope per her family.   In the ED the patient was found to have an acute mildly comminuted displaced and angulated intertrochanteric fracture of the left hip. CT of the head demonstrated no skull fracture or intracranial hemorrhage, no c-spine fracture or subluxation, and stable moderate diffuse cerebral and cerebellar atrophy and mild -moderate chronic small vessel white matter ischemic changes in both cerebral hemispheres. Mild cervical spine degenerative changes. CXR demonstrated minimal changes of congestive heart failure with chronic interstitial lung disease.  The patient  Underwent ORIF of her left hip on 07/06/2019. She has tolerated the procedure well. Repeat COVID-19 negative.Norco prescription was given by orthopedic surgeon.   Hospital Course:   L intertrochanteric hip fracture: S/P ORIF of left hip on 07/06/2019. Norco prescription was given by orthopedic surgeon.  L wrist swelling: Xray ordered, pt refused.  Resolved.  Evidence of CHF on CXR: BNP mildly increased. No respiratory distress. No history of CHF. Echocardiogram was performed today that demonstrated [id number] with diastolic dysfunction. The patient appears well compenstated. Monitor volume status. Troponin negative. The patient has a cardiac event monitor. She is wondering if she had a cardiac event that may have resulted in her fall on 07/05/2019.  Hypertension: Blood pressures are well controlled. She is on no antihypertensives at home.   Insomnia at times: Continue Remeron 7.5mg  po qhs, prn insomnia.    Procedures:  ORIF of left hip on 07/06/19  Consultations: Orthopedic surgeon   Discharge Exam: Vitals:   07/10/19 2149 07/11/19 0443  BP: (!) 143/66 (!) 125/56  Pulse: 73 100  Resp: 18 16  Temp: 99.1 F (37.3 C) 98.2 F (36.8 C)  SpO2: 96% 94%    General: Not in acute distress HEENT: PERRL, EOMI, no scleral icterus, No JVD or bruit Cardiac: S1/S2, RRR, No murmurs, gallops or rubs Pulm: Clear to auscultation bilaterally. No rales, wheezing, rhonchi or rubs. Abd: Soft, nondistended, nontender, no rebound pain, no organomegaly, BS present Ext: No edema. 2+DP/PT pulse bilaterally Musculoskeletal: mild tenderness in left hip Skin: No rashes.  Neuro: Alert and oriented X3, cranial nerves II-XII grossly intact, muscle strength 5/5 in all extremeties. Psych: Patient is not psychotic, no suicidal or hemocidal ideation.  Discharge Instructions   Discharge Instructions    Call MD for:  extreme fatigue   Complete by: As directed    Call MD for:  persistant dizziness or light-headedness   Complete by: As directed    Call MD for:  redness, tenderness, or signs of infection (pain, swelling, redness, odor or green/yellow discharge around  incision site)   Complete by: As directed    Call MD for:  severe uncontrolled pain   Complete by: As directed    Call MD for:  temperature >100.4   Complete by: As directed    Diet -  low sodium heart healthy   Complete by: As directed    Increase activity slowly   Complete by: As directed      Allergies as of 07/11/2019      Reactions   Penicillins Anaphylaxis   Did it involve swelling of the face/tongue/throat, SOB, or low BP? Yes Did it involve sudden or severe rash/hives, skin peeling, or any reaction on the inside of your mouth or nose? No Did you need to seek medical attention at a hospital or doctor's office? Yes When did it last happen?young adult or middle aged adult If all above answers are "NO", may proceed with cephalosporin use.   Shrimp [shellfish Allergy] Anaphylaxis      Medication List    TAKE these medications   acetaminophen 500 MG tablet Commonly known as: TYLENOL Take 1 tablet (500 mg total) by mouth every 6 (six) hours as needed for mild pain or headache. What changed:   how much to take  reasons to take this   aspirin EC 81 MG tablet Take 81 mg by mouth every morning.   diphenhydrAMINE 25 MG tablet Commonly known as: BENADRYL Take 25 mg by mouth every 6 (six) hours as needed for allergies.   enoxaparin 40 MG/0.4ML injection Commonly known as: LOVENOX Inject 0.4 mLs (40 mg total) into the skin daily for 30 doses. For 30 days post op for DVT prophylaxis   HYDROcodone-acetaminophen 5-325 MG tablet Commonly known as: Norco Take 1 tablet by mouth every 6 (six) hours as needed for up to 5 days for severe pain (take tylenol for mild and moderate pain).   mirtazapine 7.5 MG tablet Commonly known as: REMERON Take 1 tablet (7.5 mg total) by mouth at bedtime as needed (insomnia).   multivitamin with minerals Tabs tablet Take 1 tablet by mouth every morning.   nitroGLYCERIN 0.4 MG SL tablet Commonly known as: NITROSTAT Place 0.4 mg under the tongue every 5 (five) minutes as needed for chest pain (3 doses max).   ondansetron 4 MG tablet Commonly known as: ZOFRAN Take 1 tablet (4 mg total) by mouth every 6 (six) hours as needed  for nausea.   polyethylene glycol 17 g packet Commonly known as: MiraLax Take 17 g by mouth daily.      Allergies  Allergen Reactions  . Penicillins Anaphylaxis    Did it involve swelling of the face/tongue/throat, SOB, or low BP? Yes Did it involve sudden or severe rash/hives, skin peeling, or any reaction on the inside of your mouth or nose? No Did you need to seek medical attention at a hospital or doctor's office? Yes When did it last happen?young adult or middle aged adult If all above answers are "NO", may proceed with cephalosporin use.   Marland Kitchen Shrimp [Shellfish Allergy] Anaphylaxis    Contact information for follow-up providers    Renette Butters, MD In 2 weeks.   Specialty: Orthopedic Surgery Contact information: [address] [place] [address] [phone]            Contact information for after-discharge care    Destination    [facility] Preferred SNF .   Service: Skilled Nursing Contact information: [place]  [address] [phone]                   The results of significant diagnostics from this hospitalization (including imaging, microbiology, ancillary and laboratory) are listed below for reference.    Significant Diagnostic Studies: Dg Chest 1 View  Result Date: 07/05/2019 CLINICAL DATA:  Left hip pain following a fall. EXAM: CHEST  1 VIEW COMPARISON:  04/08/2019 FINDINGS: Normal sized heart. Tortuous aorta. Clear lungs. Mild increase in prominence of the interstitial markings with some Kerley lines on the left. Decreased prominence of the vasculature. Diffuse osteopenia. Electronic devices overlying the left shoulder and left neck. IMPRESSION: Minimal changes of congestive heart failure superimposed on chronic interstitial lung disease. Electronically Signed   By: Claudie Revering M.D.   On: 07/05/2019 20:46   Dg Knee 2 Views Left  Result Date:  07/05/2019 CLINICAL DATA:  Left hip pain following a fall. EXAM: LEFT KNEE - 1-2 VIEW COMPARISON:  None. FINDINGS: Limited examination due to obliquity on the lateral view, distortion on the frontal view due to the fact that was obtained cross-table and no oblique views. No gross fracture or dislocation and no gross effusion seen. IMPRESSION: Limited examination demonstrating no gross fracture or effusion. Electronically Signed   By: Claudie Revering M.D.   On: 07/05/2019 20:44   Ct Head Wo Contrast  Result Date: 07/05/2019 CLINICAL DATA:  Neck pain after falling and hitting the back her head on kitchen cabinets tonight. EXAM: CT HEAD WITHOUT CONTRAST CT CERVICAL SPINE WITHOUT CONTRAST TECHNIQUE: Multidetector CT imaging of the head and cervical spine was performed following the standard protocol without intravenous contrast. Multiplanar CT image reconstructions of the cervical spine were also generated. COMPARISON:  Brain MR dated 04/08/2019. Head CT dated 04/20/2015. Cervical spine CT dated 06/05/2012 FINDINGS: CT HEAD FINDINGS Brain: Stable moderately enlarged ventricles and subarachnoid spaces. Mild-to-moderate patchy white matter low density in both cerebral hemispheres without significant change. No intracranial hemorrhage, mass lesion or CT evidence of acute infarction. Vascular: No hyperdense vessel or unexpected calcification. Skull: Normal. Negative for fracture or focal lesion. Sinuses/Orbits: Status post bilateral cataract extraction. Mild left ethmoid sinus mucosal thickening. Other: None. CT CERVICAL SPINE FINDINGS Alignment: Normal. Skull base and vertebrae: No acute fracture. No primary bone lesion or focal pathologic process. Soft tissues and spinal canal: No prevertebral fluid or swelling. No visible canal hematoma. Disc levels:  Mild multilevel degenerative changes. Upper chest: Unremarkable. Other: None. IMPRESSION: 1. No skull fracture or intracranial hemorrhage. 2. No cervical spine fracture or  subluxation. 3. Stable moderate diffuse cerebral and cerebellar atrophy and mild-to-moderate chronic small vessel white matter ischemic changes in both cerebral hemispheres. 4. Mild cervical spine degenerative changes. Electronically Signed   By: Claudie Revering M.D.   On: 07/05/2019 20:56   Ct Cervical Spine Wo Contrast  Result Date: 07/05/2019 CLINICAL DATA:  Neck pain after falling and hitting the back her head on kitchen cabinets tonight. EXAM: CT HEAD WITHOUT CONTRAST CT CERVICAL SPINE WITHOUT CONTRAST TECHNIQUE: Multidetector CT imaging of the head and cervical spine was performed following the standard protocol without intravenous contrast. Multiplanar CT image reconstructions of the cervical spine were also generated. COMPARISON:  Brain MR dated 04/08/2019. Head CT dated 04/20/2015. Cervical spine CT dated 06/05/2012 FINDINGS: CT HEAD FINDINGS Brain: Stable moderately enlarged ventricles and subarachnoid spaces. Mild-to-moderate patchy white matter low density in both cerebral hemispheres without significant change. No intracranial hemorrhage, mass lesion or CT evidence of acute infarction. Vascular: No hyperdense vessel  or unexpected calcification. Skull: Normal. Negative for fracture or focal lesion. Sinuses/Orbits: Status post bilateral cataract extraction. Mild left ethmoid sinus mucosal thickening. Other: None. CT CERVICAL SPINE FINDINGS Alignment: Normal. Skull base and vertebrae: No acute fracture. No primary bone lesion or focal pathologic process. Soft tissues and spinal canal: No prevertebral fluid or swelling. No visible canal hematoma. Disc levels:  Mild multilevel degenerative changes. Upper chest: Unremarkable. Other: None. IMPRESSION: 1. No skull fracture or intracranial hemorrhage. 2. No cervical spine fracture or subluxation. 3. Stable moderate diffuse cerebral and cerebellar atrophy and mild-to-moderate chronic small vessel white matter ischemic changes in both cerebral hemispheres. 4.  Mild cervical spine degenerative changes. Electronically Signed   By: Claudie Revering M.D.   On: 07/05/2019 20:56   Dg C-arm 1-60 Min  Result Date: 07/06/2019 CLINICAL DATA:  ORIF left proximal femur fracture EXAM: DG C-ARM 1-60 MIN; LEFT FEMUR 2 VIEWS COMPARISON:  07/05/2019 left hip radiograph FLUOROSCOPY TIME:  Fluoroscopy Time:  1 minutes 19 seconds Number of Acquired Spot Images: 6 FINDINGS: Spot fluoroscopic intraoperative left femur radiographs demonstrate transfixation of anterior trochanteric left proximal femur fracture with intramedullary rod and interlocking left femoral neck pin with distal interlocking screw. IMPRESSION: Intraoperative fluoroscopic guidance for ORIF left proximal femur fracture. Electronically Signed   By: Ilona Sorrel M.D.   On: 07/06/2019 18:38   Dg Hip Unilat With Pelvis 2-3 Views Left  Result Date: 07/05/2019 CLINICAL DATA:  85 year old female with suspected left hip facture after a fall. Left hip pain. EXAM: DG HIP (WITH OR WITHOUT PELVIS) 2-3V LEFT COMPARISON:  No priors. FINDINGS: There is an acute mildly displaced mildly comminuted intertrochanteric fracture of the left hip which is associated with increased varus angulation. There is also likely some rotation of the femoral head in the acetabulum causing distortion of the inter trochanteric regions. Femoral head remains located. Bony pelvis and visualized portions of the right proximal femur appear intact. IMPRESSION: 1. Acute mildly comminuted, displaced and angulated intertrochanteric fracture of the left hip. Electronically Signed   By: Vinnie Langton M.D.   On: 07/05/2019 20:43   Dg Femur Min 2 Views Left  Result Date: 07/06/2019 CLINICAL DATA:  ORIF left proximal femur fracture EXAM: DG C-ARM 1-60 MIN; LEFT FEMUR 2 VIEWS COMPARISON:  07/05/2019 left hip radiograph FLUOROSCOPY TIME:  Fluoroscopy Time:  1 minutes 19 seconds Number of Acquired Spot Images: 6 FINDINGS: Spot fluoroscopic intraoperative left femur  radiographs demonstrate transfixation of anterior trochanteric left proximal femur fracture with intramedullary rod and interlocking left femoral neck pin with distal interlocking screw. IMPRESSION: Intraoperative fluoroscopic guidance for ORIF left proximal femur fracture. Electronically Signed   By: Ilona Sorrel M.D.   On: 07/06/2019 18:38    Microbiology: Recent Results (from the past 240 hour(s))  SARS Coronavirus 2 [facility] order, Performed in [facility] hospital lab) Nasopharyngeal Nasopharyngeal Swab     Status: None   Collection Time: 07/05/19  9:15 PM   Specimen: Nasopharyngeal Swab  Result Value Ref Range Status   SARS Coronavirus 2 NEGATIVE NEGATIVE Final    Comment: (NOTE) If result is NEGATIVE SARS-CoV-2 target nucleic acids are NOT DETECTED. The SARS-CoV-2 RNA is generally detectable in upper and lower  respiratory specimens during the acute phase of infection. The lowest  concentration of SARS-CoV-2 viral copies this assay can detect is 250  copies / mL. A negative result does not preclude SARS-CoV-2 infection  and should not be used as the sole basis for treatment or other  patient management decisions.  A negative result may occur with  improper specimen collection / handling, submission of specimen other  than nasopharyngeal swab, presence of viral mutation(s) within the  areas targeted by this assay, and inadequate number of viral copies  (<250 copies / mL). A negative result must be combined with clinical  observations, patient history, and epidemiological information. If result is POSITIVE SARS-CoV-2 target nucleic acids are DETECTED. The SARS-CoV-2 RNA is generally detectable in upper and lower  respiratory specimens dur ing the acute phase of infection.  Positive  results are indicative of active infection with SARS-CoV-2.  Clinical  correlation with patient history and other diagnostic information is  necessary to determine patient infection status.  Positive  results do  not rule out bacterial infection or co-infection with other viruses. If result is PRESUMPTIVE POSTIVE SARS-CoV-2 nucleic acids MAY BE PRESENT.   A presumptive positive result was obtained on the submitted specimen  and confirmed on repeat testing.  While 2019 novel coronavirus  (SARS-CoV-2) nucleic acids may be present in the submitted sample  additional confirmatory testing may be necessary for epidemiological  and / or clinical management purposes  to differentiate between  SARS-CoV-2 and other Sarbecovirus currently known to infect humans.  If clinically indicated additional testing with an alternate test  methodology [phone]) is advised. The SARS-CoV-2 RNA is generally  detectable in upper and lower respiratory sp ecimens during the acute  phase of infection. The expected result is Negative. Fact Sheet for Patients:  StrictlyIdeas.no Fact Sheet for Healthcare Providers: BankingDealers.co.za This test is not yet approved or cleared by the [place] FDA and has been authorized for detection and/or diagnosis of SARS-CoV-2 by FDA under an Emergency Use Authorization (EUA).  This EUA will remain in effect (meaning this test can be used) for the duration of the COVID-19 declaration under Section 564(b)(1) of the Act, 21 U.S.C. section 360bbb-3(b)(1), unless the authorization is terminated or revoked sooner. Performed at [facility], [place] [address] [place], [place] [address]   Surgical pcr screen     Status: None   Collection Time: 07/06/19  2:15 AM   Specimen: Nasal Mucosa; Nasal Swab  Result Value Ref Range Status   MRSA, PCR NEGATIVE NEGATIVE Final   Staphylococcus aureus NEGATIVE NEGATIVE Final    Comment: (NOTE) The Xpert SA Assay (FDA approved for NASAL specimens in patients 27 years of age and older), is one component of a comprehensive surveillance program. It is not intended to diagnose infection nor  to guide or monitor treatment. Performed at [facility], [place] [address] [place], [place] [address]   Novel Coronavirus, NAA (hospital order; send-out to ref lab)     Status: None   Collection Time: 07/10/19  2:24 PM   Specimen: Nasopharyngeal Swab; Respiratory  Result Value Ref Range Status   SARS-CoV-2, NAA NOT DETECTED NOT DETECTED Final    Comment: (NOTE) This nucleic acid amplification test was developed and its performance characteristics determined by [employer]. Nucleic acid amplification tests include PCR and TMA. This test has not been FDA cleared or approved. This test has been authorized by FDA under an Emergency Use Authorization (EUA). This test is only authorized for the duration of time the declaration that circumstances exist justifying the authorization of the emergency use of in vitro diagnostic tests for detection of SARS-CoV-2 virus and/or diagnosis of COVID-19 infection under section 564(b)(1) of the Act, 21 U.S.C. [id number]) (1), unless the authorization is terminated or  revoked sooner. When diagnostic testing is negative, the possibility of a false negative result should be considered in the context of a patient's recent exposures and the presence of clinical signs and symptoms consistent with COVID-19. An individual without symptoms of COVID- 19 and who is not shedding SARS-CoV-2 vi rus would expect to have a negative (not detected) result in this assay. Performed At: [facility] [address] [place], [place] [id number] Rush Farmer MD [id number]    [place]  Final    Comment: Performed at [facility], [place] [address], [place], [place] [address]     Labs: Basic Metabolic Panel: Recent Labs  Lab 07/05/19 2123 07/06/19 0627 07/07/19 0557 07/10/19 0504  NA 136 138 137 138  K 3.6 3.8 3.7 3.9  CL 101 106 104 103  CO2 23 24 23 24   GLUCOSE 121* 118* 119* 121*  BUN 17 15 18 15    CREATININE 0.80 0.52 0.66 0.52  CALCIUM 8.7* 8.7* 8.9 8.6*   Liver Function Tests: Recent Labs  Lab 07/05/19 2123 07/06/19 0627  AST 22 18  ALT 13 12  ALKPHOS 75 73  BILITOT 0.8 0.7  PROT 6.2* 5.7*  ALBUMIN 3.3* 2.9*   No results for input(s): LIPASE, AMYLASE in the last 168 hours. No results for input(s): AMMONIA in the last 168 hours. CBC: Recent Labs  Lab 07/05/19 2123 07/06/19 0627 07/07/19 0557 07/10/19 0212  WBC 14.3* 12.3* 11.9* 10.3  NEUTROABS 11.9*  --  8.4* 6.5  HGB 13.9 12.8 11.2* 11.5*  HCT 41.9 38.4 33.8* 34.9*  MCV 95.2 94.1 95.5 95.1  PLT 189 190 173 180   Cardiac Enzymes: No results for input(s): CKTOTAL, CKMB, CKMBINDEX, TROPONINI in the last 168 hours. BNP: BNP (last 3 results) Recent Labs    07/06/19 0627  BNP 214.6*    ProBNP (last 3 results) No results for input(s): PROBNP in the last 8760 hours.  CBG: No results for input(s): GLUCAP in the last 168 hours.     Signed:  Ivor Costa  [facility] 07/11/2019, 2:32 PM

## 2019-07-11 NOTE — Social Work (Signed)
Clinical Social Worker facilitated patient discharge including contacting patient family and facility to confirm patient discharge plans.  Clinical information faxed to facility and family agreeable with plan.  CSW arranged ambulance transport via [facility] to [facility]. Pick up at [place].  RN to call [phone]  with report prior to discharge.  Clinical Social Worker will sign off for now as social work intervention is no longer needed. Please consult [place] again if new need arises.  Westley Hummer, MSW, [place] Social Worker [phone]

## 2019-07-11 NOTE — TOC Transition Note (Signed)
Transition of Care [facility]) - CM/SW Discharge Note   Patient Details  Name: Lauren Moreno MRN: [id number] Date of Birth: 21-Jul-1928  Transition of Care [facility]) CM/SW Contact:  Alexander Mt, [place] Phone Number: 07/11/2019, 2:15 PM   Clinical Narrative:    Pt stable for dc per MD; pt son Gene called and aware of dc today. Await dc summary and orders. [facility] aware and waiting for paperwork. [facility] papers on chart.    Final next level of care: Skilled Nursing Facility Barriers to Discharge: Barriers Resolved   Patient Goals and CMS Choice Patient states their goals for this hospitalization and ongoing recovery are:: to safely be able to return to home CMS Medicare.gov Compare Post Acute Care list provided to:: Patient Choice offered to / list presented to : Patient  Discharge Placement PASRR number recieved: 07/09/19            Patient chooses bed at: [name] Patient to be transferred to facility by: [facility] Name of family member notified: pt son Gene Patient and family notified of of transfer: 07/11/19  Discharge Plan and Services In-house Referral: Clinical Social Work Discharge Planning Services: CM Consult Post Acute Care Choice: Durable Medical Equipment, [place]                               Social Determinants of Health (SDOH) Interventions     Readmission Risk Interventions Readmission Risk Prevention Plan 07/08/2019  Post Dischage Appt Not Complete  Appt Comments SNF placement  Medication Screening Complete  Transportation Screening Complete  Some recent data might be hidden

## 2019-07-11 NOTE — TOC Progression Note (Signed)
Transition of Care [facility]) - Progression Note    Patient Details  Name: Lauren Moreno MRN: [id number] Date of Birth: 1927-11-05  Transition of Care [facility]) CM/SW [place], [place] Phone Number: 07/11/2019, 10:54 AM  Clinical Narrative:    Pt COVID has resulted, at this time await medical stability to dc pt to [place]. CSW has paged MD.    Expected Discharge Plan: [place] Barriers to Discharge: Barriers Resolved  Expected Discharge Plan and Services Expected Discharge Plan: [place] In-house Referral: Clinical Social Work Discharge Planning Services: CM Consult Post Acute Care Choice: [place], [place] Living arrangements for the past 2 months: Single Family Home   Social Determinants of Health (SDOH) Interventions    Readmission Risk Interventions Readmission Risk Prevention Plan 07/08/2019  Post Dischage Appt Not Complete  Appt Comments SNF placement  Medication Screening Complete  Transportation Screening Complete  Some recent data might be hidden

## 2019-07-13 DIAGNOSIS — I491 Atrial premature depolarization: Secondary | ICD-10-CM | POA: Diagnosis not present

## 2019-07-13 DIAGNOSIS — E785 Hyperlipidemia, unspecified: Secondary | ICD-10-CM | POA: Diagnosis not present

## 2019-07-13 DIAGNOSIS — E079 Disorder of thyroid, unspecified: Secondary | ICD-10-CM | POA: Diagnosis not present

## 2019-07-13 DIAGNOSIS — I10 Essential (primary) hypertension: Secondary | ICD-10-CM | POA: Diagnosis not present

## 2019-07-19 DIAGNOSIS — R001 Bradycardia, unspecified: Secondary | ICD-10-CM | POA: Diagnosis not present

## 2019-07-23 DIAGNOSIS — S72142D Displaced intertrochanteric fracture of left femur, subsequent encounter for closed fracture with routine healing: Secondary | ICD-10-CM | POA: Diagnosis not present

## 2019-07-29 ENCOUNTER — Telehealth: Payer: Self-pay | Admitting: Internal Medicine

## 2019-07-29 NOTE — Telephone Encounter (Signed)
Pt is at [facility].Marland KitchenMarland KitchenAdv that when monitor is reviewed by Dr. Harrington Challenger and sent to me, we will call her back.  She is SOB during therapy and even just sitting in chair.  This is not new per daughter.  She was getting SOB prior to falling.  Gets dizzy when she gets out of bed which is not very often right now.  She says "it seems like my heart is not doing right".  Vitals seem to be stable when checked but therapist reports HR goes down during therapy.  No heart rates reported.    Daughter is interested in hearing what her heart was doing when she fell and broke her hip. (07/05/19)

## 2019-07-29 NOTE — Telephone Encounter (Signed)
New Message:  Daughter of the patient called and wanted to know what the results of the patient's heart monitor were. She called [employer], and according to [facility], the monitor was sent back to the company on the 14th and results were sent to Dr. Harrington Challenger on the 17th.

## 2019-07-30 NOTE — Telephone Encounter (Signed)
There was not atrial fibrillation on monitor Isolated skps only   No significant slowing in HR   REcomm trila of low dose toprol to see if helps

## 2019-08-01 MED ORDER — METOPROLOL SUCCINATE ER 25 MG PO TB24: 12.5000 mg | ORAL_TABLET | Freq: Every day | ORAL | 3 refills | Status: DC

## 2019-08-01 NOTE — Telephone Encounter (Signed)
Spoke with daughter and reviewed results from Dr. Harrington Challenger. [facility] [phone]

## 2019-08-08 ENCOUNTER — Other Ambulatory Visit: Payer: Self-pay | Admitting: *Deleted

## 2019-08-08 NOTE — Patient Outreach (Signed)
Member assessed for potential [facility] Care Management needs as a benefit of [place] [employer].  Member is receiving rehab therapy at [facility].   Will plan to make [facility] Management referral upon SNF discharge.   Mrs. Afzal lived alone prior to admission. History of HTN, CHF, HLD.   Marthenia Rolling, MSN-Ed, RN,BSN [place] Acute Care Coordinator [phone] [facility]) [phone]  (Toll free office)

## 2019-08-11 ENCOUNTER — Other Ambulatory Visit: Payer: Self-pay | Admitting: *Deleted

## 2019-08-12 DIAGNOSIS — R2681 Unsteadiness on feet: Secondary | ICD-10-CM | POA: Diagnosis not present

## 2019-08-12 DIAGNOSIS — R278 Other lack of coordination: Secondary | ICD-10-CM | POA: Diagnosis not present

## 2019-08-12 DIAGNOSIS — M6281 Muscle weakness (generalized): Secondary | ICD-10-CM | POA: Diagnosis not present

## 2019-08-12 DIAGNOSIS — Z9181 History of falling: Secondary | ICD-10-CM | POA: Diagnosis not present

## 2019-08-12 DIAGNOSIS — Z4789 Encounter for other orthopedic aftercare: Secondary | ICD-10-CM | POA: Diagnosis not present

## 2019-08-12 DIAGNOSIS — S72142D Displaced intertrochanteric fracture of left femur, subsequent encounter for closed fracture with routine healing: Secondary | ICD-10-CM | POA: Diagnosis not present

## 2019-08-12 DIAGNOSIS — Z5189 Encounter for other specified aftercare: Secondary | ICD-10-CM | POA: Diagnosis not present

## 2019-08-12 DIAGNOSIS — M25552 Pain in left hip: Secondary | ICD-10-CM | POA: Diagnosis not present

## 2019-08-12 NOTE — Patient Outreach (Signed)
Member assessed for potential [facility] Care Management needs as a benefit of  [place] [employer].  Per Patient Ping and [facility] UM RN, member has transitioned to long term care/private pay at [facility].   No identifiable [facility] Care Management needs at this time.  Marthenia Rolling, MSN-Ed, RN,BSN [place] Acute Care Coordinator [phone] [facility]) [phone]  (Toll free office)

## 2019-08-13 DIAGNOSIS — Z9181 History of falling: Secondary | ICD-10-CM | POA: Diagnosis not present

## 2019-08-13 DIAGNOSIS — R278 Other lack of coordination: Secondary | ICD-10-CM | POA: Diagnosis not present

## 2019-08-13 DIAGNOSIS — R2681 Unsteadiness on feet: Secondary | ICD-10-CM | POA: Diagnosis not present

## 2019-08-13 DIAGNOSIS — M25552 Pain in left hip: Secondary | ICD-10-CM | POA: Diagnosis not present

## 2019-08-13 DIAGNOSIS — M6281 Muscle weakness (generalized): Secondary | ICD-10-CM | POA: Diagnosis not present

## 2019-08-13 DIAGNOSIS — S72142D Displaced intertrochanteric fracture of left femur, subsequent encounter for closed fracture with routine healing: Secondary | ICD-10-CM | POA: Diagnosis not present

## 2019-08-14 DIAGNOSIS — Z9181 History of falling: Secondary | ICD-10-CM | POA: Diagnosis not present

## 2019-08-14 DIAGNOSIS — R278 Other lack of coordination: Secondary | ICD-10-CM | POA: Diagnosis not present

## 2019-08-14 DIAGNOSIS — S72142D Displaced intertrochanteric fracture of left femur, subsequent encounter for closed fracture with routine healing: Secondary | ICD-10-CM | POA: Diagnosis not present

## 2019-08-14 DIAGNOSIS — R2681 Unsteadiness on feet: Secondary | ICD-10-CM | POA: Diagnosis not present

## 2019-08-14 DIAGNOSIS — M6281 Muscle weakness (generalized): Secondary | ICD-10-CM | POA: Diagnosis not present

## 2019-08-14 DIAGNOSIS — M25552 Pain in left hip: Secondary | ICD-10-CM | POA: Diagnosis not present

## 2019-08-15 DIAGNOSIS — M25552 Pain in left hip: Secondary | ICD-10-CM | POA: Diagnosis not present

## 2019-08-15 DIAGNOSIS — R2681 Unsteadiness on feet: Secondary | ICD-10-CM | POA: Diagnosis not present

## 2019-08-15 DIAGNOSIS — S72142D Displaced intertrochanteric fracture of left femur, subsequent encounter for closed fracture with routine healing: Secondary | ICD-10-CM | POA: Diagnosis not present

## 2019-08-15 DIAGNOSIS — Z9181 History of falling: Secondary | ICD-10-CM | POA: Diagnosis not present

## 2019-08-15 DIAGNOSIS — R278 Other lack of coordination: Secondary | ICD-10-CM | POA: Diagnosis not present

## 2019-08-15 DIAGNOSIS — M6281 Muscle weakness (generalized): Secondary | ICD-10-CM | POA: Diagnosis not present

## 2019-08-18 DIAGNOSIS — R2681 Unsteadiness on feet: Secondary | ICD-10-CM | POA: Diagnosis not present

## 2019-08-18 DIAGNOSIS — M25552 Pain in left hip: Secondary | ICD-10-CM | POA: Diagnosis not present

## 2019-08-18 DIAGNOSIS — S72142D Displaced intertrochanteric fracture of left femur, subsequent encounter for closed fracture with routine healing: Secondary | ICD-10-CM | POA: Diagnosis not present

## 2019-08-18 DIAGNOSIS — Z9181 History of falling: Secondary | ICD-10-CM | POA: Diagnosis not present

## 2019-08-18 DIAGNOSIS — M6281 Muscle weakness (generalized): Secondary | ICD-10-CM | POA: Diagnosis not present

## 2019-08-18 DIAGNOSIS — R278 Other lack of coordination: Secondary | ICD-10-CM | POA: Diagnosis not present

## 2019-08-19 DIAGNOSIS — S72142D Displaced intertrochanteric fracture of left femur, subsequent encounter for closed fracture with routine healing: Secondary | ICD-10-CM | POA: Diagnosis not present

## 2019-08-19 DIAGNOSIS — R278 Other lack of coordination: Secondary | ICD-10-CM | POA: Diagnosis not present

## 2019-08-19 DIAGNOSIS — M6281 Muscle weakness (generalized): Secondary | ICD-10-CM | POA: Diagnosis not present

## 2019-08-19 DIAGNOSIS — M25552 Pain in left hip: Secondary | ICD-10-CM | POA: Diagnosis not present

## 2019-08-19 DIAGNOSIS — Z9181 History of falling: Secondary | ICD-10-CM | POA: Diagnosis not present

## 2019-08-19 DIAGNOSIS — R2681 Unsteadiness on feet: Secondary | ICD-10-CM | POA: Diagnosis not present

## 2019-08-20 DIAGNOSIS — Z9181 History of falling: Secondary | ICD-10-CM | POA: Diagnosis not present

## 2019-08-20 DIAGNOSIS — M25552 Pain in left hip: Secondary | ICD-10-CM | POA: Diagnosis not present

## 2019-08-20 DIAGNOSIS — R2681 Unsteadiness on feet: Secondary | ICD-10-CM | POA: Diagnosis not present

## 2019-08-20 DIAGNOSIS — M6281 Muscle weakness (generalized): Secondary | ICD-10-CM | POA: Diagnosis not present

## 2019-08-20 DIAGNOSIS — M1612 Unilateral primary osteoarthritis, left hip: Secondary | ICD-10-CM | POA: Diagnosis not present

## 2019-08-20 DIAGNOSIS — R278 Other lack of coordination: Secondary | ICD-10-CM | POA: Diagnosis not present

## 2019-08-20 DIAGNOSIS — S72142D Displaced intertrochanteric fracture of left femur, subsequent encounter for closed fracture with routine healing: Secondary | ICD-10-CM | POA: Diagnosis not present

## 2019-08-21 DIAGNOSIS — S72142D Displaced intertrochanteric fracture of left femur, subsequent encounter for closed fracture with routine healing: Secondary | ICD-10-CM | POA: Diagnosis not present

## 2019-08-21 DIAGNOSIS — R278 Other lack of coordination: Secondary | ICD-10-CM | POA: Diagnosis not present

## 2019-08-21 DIAGNOSIS — M25552 Pain in left hip: Secondary | ICD-10-CM | POA: Diagnosis not present

## 2019-08-21 DIAGNOSIS — M6281 Muscle weakness (generalized): Secondary | ICD-10-CM | POA: Diagnosis not present

## 2019-08-21 DIAGNOSIS — R2681 Unsteadiness on feet: Secondary | ICD-10-CM | POA: Diagnosis not present

## 2019-08-21 DIAGNOSIS — Z9181 History of falling: Secondary | ICD-10-CM | POA: Diagnosis not present

## 2019-08-22 DIAGNOSIS — Z9181 History of falling: Secondary | ICD-10-CM | POA: Diagnosis not present

## 2019-08-22 DIAGNOSIS — M6281 Muscle weakness (generalized): Secondary | ICD-10-CM | POA: Diagnosis not present

## 2019-08-22 DIAGNOSIS — M25552 Pain in left hip: Secondary | ICD-10-CM | POA: Diagnosis not present

## 2019-08-22 DIAGNOSIS — S72142D Displaced intertrochanteric fracture of left femur, subsequent encounter for closed fracture with routine healing: Secondary | ICD-10-CM | POA: Diagnosis not present

## 2019-08-22 DIAGNOSIS — R278 Other lack of coordination: Secondary | ICD-10-CM | POA: Diagnosis not present

## 2019-08-22 DIAGNOSIS — R2681 Unsteadiness on feet: Secondary | ICD-10-CM | POA: Diagnosis not present

## 2019-08-24 DIAGNOSIS — R2681 Unsteadiness on feet: Secondary | ICD-10-CM | POA: Diagnosis not present

## 2019-08-24 DIAGNOSIS — Z9181 History of falling: Secondary | ICD-10-CM | POA: Diagnosis not present

## 2019-08-24 DIAGNOSIS — S72142D Displaced intertrochanteric fracture of left femur, subsequent encounter for closed fracture with routine healing: Secondary | ICD-10-CM | POA: Diagnosis not present

## 2019-08-24 DIAGNOSIS — M6281 Muscle weakness (generalized): Secondary | ICD-10-CM | POA: Diagnosis not present

## 2019-08-24 DIAGNOSIS — R278 Other lack of coordination: Secondary | ICD-10-CM | POA: Diagnosis not present

## 2019-08-24 DIAGNOSIS — M25552 Pain in left hip: Secondary | ICD-10-CM | POA: Diagnosis not present

## 2019-08-25 DIAGNOSIS — M6281 Muscle weakness (generalized): Secondary | ICD-10-CM | POA: Diagnosis not present

## 2019-08-25 DIAGNOSIS — Z9181 History of falling: Secondary | ICD-10-CM | POA: Diagnosis not present

## 2019-08-25 DIAGNOSIS — M25552 Pain in left hip: Secondary | ICD-10-CM | POA: Diagnosis not present

## 2019-08-25 DIAGNOSIS — S72142D Displaced intertrochanteric fracture of left femur, subsequent encounter for closed fracture with routine healing: Secondary | ICD-10-CM | POA: Diagnosis not present

## 2019-08-25 DIAGNOSIS — R2681 Unsteadiness on feet: Secondary | ICD-10-CM | POA: Diagnosis not present

## 2019-08-25 DIAGNOSIS — R278 Other lack of coordination: Secondary | ICD-10-CM | POA: Diagnosis not present

## 2019-08-26 DIAGNOSIS — R2681 Unsteadiness on feet: Secondary | ICD-10-CM | POA: Diagnosis not present

## 2019-08-26 DIAGNOSIS — R278 Other lack of coordination: Secondary | ICD-10-CM | POA: Diagnosis not present

## 2019-08-26 DIAGNOSIS — S72142D Displaced intertrochanteric fracture of left femur, subsequent encounter for closed fracture with routine healing: Secondary | ICD-10-CM | POA: Diagnosis not present

## 2019-08-26 DIAGNOSIS — M6281 Muscle weakness (generalized): Secondary | ICD-10-CM | POA: Diagnosis not present

## 2019-08-26 DIAGNOSIS — M25552 Pain in left hip: Secondary | ICD-10-CM | POA: Diagnosis not present

## 2019-08-26 DIAGNOSIS — Z9181 History of falling: Secondary | ICD-10-CM | POA: Diagnosis not present

## 2019-08-27 DIAGNOSIS — M25552 Pain in left hip: Secondary | ICD-10-CM | POA: Diagnosis not present

## 2019-08-27 DIAGNOSIS — Z9181 History of falling: Secondary | ICD-10-CM | POA: Diagnosis not present

## 2019-08-27 DIAGNOSIS — S72142D Displaced intertrochanteric fracture of left femur, subsequent encounter for closed fracture with routine healing: Secondary | ICD-10-CM | POA: Diagnosis not present

## 2019-08-27 DIAGNOSIS — M6281 Muscle weakness (generalized): Secondary | ICD-10-CM | POA: Diagnosis not present

## 2019-08-27 DIAGNOSIS — R2681 Unsteadiness on feet: Secondary | ICD-10-CM | POA: Diagnosis not present

## 2019-08-27 DIAGNOSIS — R278 Other lack of coordination: Secondary | ICD-10-CM | POA: Diagnosis not present

## 2019-08-29 DIAGNOSIS — M6281 Muscle weakness (generalized): Secondary | ICD-10-CM | POA: Diagnosis not present

## 2019-08-29 DIAGNOSIS — M25552 Pain in left hip: Secondary | ICD-10-CM | POA: Diagnosis not present

## 2019-08-29 DIAGNOSIS — Z9181 History of falling: Secondary | ICD-10-CM | POA: Diagnosis not present

## 2019-08-29 DIAGNOSIS — R278 Other lack of coordination: Secondary | ICD-10-CM | POA: Diagnosis not present

## 2019-08-29 DIAGNOSIS — R2681 Unsteadiness on feet: Secondary | ICD-10-CM | POA: Diagnosis not present

## 2019-08-29 DIAGNOSIS — S72142D Displaced intertrochanteric fracture of left femur, subsequent encounter for closed fracture with routine healing: Secondary | ICD-10-CM | POA: Diagnosis not present

## 2019-08-31 DIAGNOSIS — Z20828 Contact with and (suspected) exposure to other viral communicable diseases: Secondary | ICD-10-CM | POA: Diagnosis not present

## 2019-09-01 DIAGNOSIS — R278 Other lack of coordination: Secondary | ICD-10-CM | POA: Diagnosis not present

## 2019-09-01 DIAGNOSIS — S72142D Displaced intertrochanteric fracture of left femur, subsequent encounter for closed fracture with routine healing: Secondary | ICD-10-CM | POA: Diagnosis not present

## 2019-09-01 DIAGNOSIS — M6281 Muscle weakness (generalized): Secondary | ICD-10-CM | POA: Diagnosis not present

## 2019-09-01 DIAGNOSIS — Z9181 History of falling: Secondary | ICD-10-CM | POA: Diagnosis not present

## 2019-09-01 DIAGNOSIS — R2681 Unsteadiness on feet: Secondary | ICD-10-CM | POA: Diagnosis not present

## 2019-09-01 DIAGNOSIS — M25552 Pain in left hip: Secondary | ICD-10-CM | POA: Diagnosis not present

## 2019-09-02 DIAGNOSIS — M25552 Pain in left hip: Secondary | ICD-10-CM | POA: Diagnosis not present

## 2019-09-02 DIAGNOSIS — R278 Other lack of coordination: Secondary | ICD-10-CM | POA: Diagnosis not present

## 2019-09-02 DIAGNOSIS — S72142D Displaced intertrochanteric fracture of left femur, subsequent encounter for closed fracture with routine healing: Secondary | ICD-10-CM | POA: Diagnosis not present

## 2019-09-02 DIAGNOSIS — M6281 Muscle weakness (generalized): Secondary | ICD-10-CM | POA: Diagnosis not present

## 2019-09-02 DIAGNOSIS — R2681 Unsteadiness on feet: Secondary | ICD-10-CM | POA: Diagnosis not present

## 2019-09-03 DIAGNOSIS — R2681 Unsteadiness on feet: Secondary | ICD-10-CM | POA: Diagnosis not present

## 2019-09-03 DIAGNOSIS — R278 Other lack of coordination: Secondary | ICD-10-CM | POA: Diagnosis not present

## 2019-09-03 DIAGNOSIS — M25552 Pain in left hip: Secondary | ICD-10-CM | POA: Diagnosis not present

## 2019-09-03 DIAGNOSIS — M6281 Muscle weakness (generalized): Secondary | ICD-10-CM | POA: Diagnosis not present

## 2019-09-03 DIAGNOSIS — S72142D Displaced intertrochanteric fracture of left femur, subsequent encounter for closed fracture with routine healing: Secondary | ICD-10-CM | POA: Diagnosis not present

## 2019-09-04 DIAGNOSIS — R278 Other lack of coordination: Secondary | ICD-10-CM | POA: Diagnosis not present

## 2019-09-04 DIAGNOSIS — M6281 Muscle weakness (generalized): Secondary | ICD-10-CM | POA: Diagnosis not present

## 2019-09-04 DIAGNOSIS — S72142D Displaced intertrochanteric fracture of left femur, subsequent encounter for closed fracture with routine healing: Secondary | ICD-10-CM | POA: Diagnosis not present

## 2019-09-04 DIAGNOSIS — M25552 Pain in left hip: Secondary | ICD-10-CM | POA: Diagnosis not present

## 2019-09-04 DIAGNOSIS — R2681 Unsteadiness on feet: Secondary | ICD-10-CM | POA: Diagnosis not present

## 2019-09-05 DIAGNOSIS — R278 Other lack of coordination: Secondary | ICD-10-CM | POA: Diagnosis not present

## 2019-09-05 DIAGNOSIS — S72142D Displaced intertrochanteric fracture of left femur, subsequent encounter for closed fracture with routine healing: Secondary | ICD-10-CM | POA: Diagnosis not present

## 2019-09-05 DIAGNOSIS — M25552 Pain in left hip: Secondary | ICD-10-CM | POA: Diagnosis not present

## 2019-09-05 DIAGNOSIS — M6281 Muscle weakness (generalized): Secondary | ICD-10-CM | POA: Diagnosis not present

## 2019-09-05 DIAGNOSIS — R2681 Unsteadiness on feet: Secondary | ICD-10-CM | POA: Diagnosis not present

## 2019-09-07 DIAGNOSIS — M79652 Pain in left thigh: Secondary | ICD-10-CM | POA: Diagnosis not present

## 2019-09-07 DIAGNOSIS — S7292XA Unspecified fracture of left femur, initial encounter for closed fracture: Secondary | ICD-10-CM | POA: Diagnosis not present

## 2019-09-08 ENCOUNTER — Other Ambulatory Visit (HOSPITAL_COMMUNITY): Payer: Self-pay | Admitting: Orthopedic Surgery

## 2019-09-08 ENCOUNTER — Ambulatory Visit (HOSPITAL_COMMUNITY)
Admission: RE | Admit: 2019-09-08 | Discharge: 2019-09-08 | Disposition: A | Discharge status: Home / Self Care | Payer: Medicare Other | Source: Ambulatory Visit | Attending: Orthopedic Surgery | Admitting: Orthopedic Surgery

## 2019-09-08 ENCOUNTER — Encounter (INDEPENDENT_AMBULATORY_CARE_PROVIDER_SITE_OTHER): Payer: Self-pay

## 2019-09-08 ENCOUNTER — Other Ambulatory Visit: Payer: Self-pay

## 2019-09-08 DIAGNOSIS — R2681 Unsteadiness on feet: Secondary | ICD-10-CM | POA: Diagnosis not present

## 2019-09-08 DIAGNOSIS — M79605 Pain in left leg: Secondary | ICD-10-CM | POA: Diagnosis not present

## 2019-09-08 DIAGNOSIS — M1612 Unilateral primary osteoarthritis, left hip: Secondary | ICD-10-CM | POA: Diagnosis not present

## 2019-09-08 DIAGNOSIS — R278 Other lack of coordination: Secondary | ICD-10-CM | POA: Diagnosis not present

## 2019-09-08 DIAGNOSIS — M25552 Pain in left hip: Secondary | ICD-10-CM | POA: Diagnosis not present

## 2019-09-08 DIAGNOSIS — M7989 Other specified soft tissue disorders: Secondary | ICD-10-CM

## 2019-09-08 DIAGNOSIS — M6281 Muscle weakness (generalized): Secondary | ICD-10-CM | POA: Diagnosis not present

## 2019-09-08 DIAGNOSIS — S72142D Displaced intertrochanteric fracture of left femur, subsequent encounter for closed fracture with routine healing: Secondary | ICD-10-CM | POA: Diagnosis not present

## 2019-09-08 NOTE — Progress Notes (Signed)
Left lower extremity venous duplex completed. Refer to "CV Proc" under chart review to view preliminary results.  09/08/2019 3:16 PM Kelby Aline., MHA, RVT, RDCS, RDMS

## 2019-09-09 DIAGNOSIS — R2681 Unsteadiness on feet: Secondary | ICD-10-CM | POA: Diagnosis not present

## 2019-09-09 DIAGNOSIS — M6281 Muscle weakness (generalized): Secondary | ICD-10-CM | POA: Diagnosis not present

## 2019-09-09 DIAGNOSIS — M25552 Pain in left hip: Secondary | ICD-10-CM | POA: Diagnosis not present

## 2019-09-09 DIAGNOSIS — R278 Other lack of coordination: Secondary | ICD-10-CM | POA: Diagnosis not present

## 2019-09-09 DIAGNOSIS — S72142D Displaced intertrochanteric fracture of left femur, subsequent encounter for closed fracture with routine healing: Secondary | ICD-10-CM | POA: Diagnosis not present

## 2019-09-10 DIAGNOSIS — R2681 Unsteadiness on feet: Secondary | ICD-10-CM | POA: Diagnosis not present

## 2019-09-10 DIAGNOSIS — F22 Delusional disorders: Secondary | ICD-10-CM | POA: Diagnosis not present

## 2019-09-10 DIAGNOSIS — R278 Other lack of coordination: Secondary | ICD-10-CM | POA: Diagnosis not present

## 2019-09-10 DIAGNOSIS — S72142D Displaced intertrochanteric fracture of left femur, subsequent encounter for closed fracture with routine healing: Secondary | ICD-10-CM | POA: Diagnosis not present

## 2019-09-10 DIAGNOSIS — F419 Anxiety disorder, unspecified: Secondary | ICD-10-CM | POA: Diagnosis not present

## 2019-09-10 DIAGNOSIS — M6281 Muscle weakness (generalized): Secondary | ICD-10-CM | POA: Diagnosis not present

## 2019-09-10 DIAGNOSIS — G4700 Insomnia, unspecified: Secondary | ICD-10-CM | POA: Diagnosis not present

## 2019-09-10 DIAGNOSIS — M25552 Pain in left hip: Secondary | ICD-10-CM | POA: Diagnosis not present

## 2019-09-14 DIAGNOSIS — M25552 Pain in left hip: Secondary | ICD-10-CM | POA: Diagnosis not present

## 2019-09-15 DIAGNOSIS — Z20828 Contact with and (suspected) exposure to other viral communicable diseases: Secondary | ICD-10-CM | POA: Diagnosis not present

## 2019-09-21 DIAGNOSIS — M79606 Pain in leg, unspecified: Secondary | ICD-10-CM | POA: Diagnosis not present

## 2019-09-24 DIAGNOSIS — F419 Anxiety disorder, unspecified: Secondary | ICD-10-CM | POA: Diagnosis not present

## 2019-09-24 DIAGNOSIS — G4700 Insomnia, unspecified: Secondary | ICD-10-CM | POA: Diagnosis not present

## 2019-09-29 DIAGNOSIS — Z23 Encounter for immunization: Secondary | ICD-10-CM | POA: Diagnosis not present

## 2019-10-06 DIAGNOSIS — R278 Other lack of coordination: Secondary | ICD-10-CM | POA: Diagnosis not present

## 2019-10-06 DIAGNOSIS — S72142D Displaced intertrochanteric fracture of left femur, subsequent encounter for closed fracture with routine healing: Secondary | ICD-10-CM | POA: Diagnosis not present

## 2019-10-06 DIAGNOSIS — M6281 Muscle weakness (generalized): Secondary | ICD-10-CM | POA: Diagnosis not present

## 2019-10-06 DIAGNOSIS — Z9181 History of falling: Secondary | ICD-10-CM | POA: Diagnosis not present

## 2019-10-06 DIAGNOSIS — R41841 Cognitive communication deficit: Secondary | ICD-10-CM | POA: Diagnosis not present

## 2019-10-06 DIAGNOSIS — M545 Low back pain: Secondary | ICD-10-CM | POA: Diagnosis not present

## 2019-10-06 DIAGNOSIS — M25552 Pain in left hip: Secondary | ICD-10-CM | POA: Diagnosis not present

## 2019-10-06 DIAGNOSIS — R2681 Unsteadiness on feet: Secondary | ICD-10-CM | POA: Diagnosis not present

## 2019-10-07 DIAGNOSIS — R278 Other lack of coordination: Secondary | ICD-10-CM | POA: Diagnosis not present

## 2019-10-07 DIAGNOSIS — S72142D Displaced intertrochanteric fracture of left femur, subsequent encounter for closed fracture with routine healing: Secondary | ICD-10-CM | POA: Diagnosis not present

## 2019-10-07 DIAGNOSIS — Z9181 History of falling: Secondary | ICD-10-CM | POA: Diagnosis not present

## 2019-10-07 DIAGNOSIS — R2681 Unsteadiness on feet: Secondary | ICD-10-CM | POA: Diagnosis not present

## 2019-10-07 DIAGNOSIS — M6281 Muscle weakness (generalized): Secondary | ICD-10-CM | POA: Diagnosis not present

## 2019-10-07 DIAGNOSIS — M25552 Pain in left hip: Secondary | ICD-10-CM | POA: Diagnosis not present

## 2019-10-08 DIAGNOSIS — D509 Iron deficiency anemia, unspecified: Secondary | ICD-10-CM | POA: Diagnosis not present

## 2019-10-08 DIAGNOSIS — Z1159 Encounter for screening for other viral diseases: Secondary | ICD-10-CM | POA: Diagnosis not present

## 2019-10-08 DIAGNOSIS — Z79899 Other long term (current) drug therapy: Secondary | ICD-10-CM | POA: Diagnosis not present

## 2019-10-08 DIAGNOSIS — R0602 Shortness of breath: Secondary | ICD-10-CM | POA: Diagnosis not present

## 2019-10-08 DIAGNOSIS — D649 Anemia, unspecified: Secondary | ICD-10-CM | POA: Diagnosis not present

## 2019-10-09 DIAGNOSIS — Z9181 History of falling: Secondary | ICD-10-CM | POA: Diagnosis not present

## 2019-10-09 DIAGNOSIS — R278 Other lack of coordination: Secondary | ICD-10-CM | POA: Diagnosis not present

## 2019-10-09 DIAGNOSIS — M6281 Muscle weakness (generalized): Secondary | ICD-10-CM | POA: Diagnosis not present

## 2019-10-09 DIAGNOSIS — M25552 Pain in left hip: Secondary | ICD-10-CM | POA: Diagnosis not present

## 2019-10-09 DIAGNOSIS — S72142D Displaced intertrochanteric fracture of left femur, subsequent encounter for closed fracture with routine healing: Secondary | ICD-10-CM | POA: Diagnosis not present

## 2019-10-09 DIAGNOSIS — R2681 Unsteadiness on feet: Secondary | ICD-10-CM | POA: Diagnosis not present

## 2019-10-13 DIAGNOSIS — M25552 Pain in left hip: Secondary | ICD-10-CM | POA: Diagnosis not present

## 2019-10-13 DIAGNOSIS — M6281 Muscle weakness (generalized): Secondary | ICD-10-CM | POA: Diagnosis not present

## 2019-10-13 DIAGNOSIS — Z9181 History of falling: Secondary | ICD-10-CM | POA: Diagnosis not present

## 2019-10-13 DIAGNOSIS — R2681 Unsteadiness on feet: Secondary | ICD-10-CM | POA: Diagnosis not present

## 2019-10-13 DIAGNOSIS — R278 Other lack of coordination: Secondary | ICD-10-CM | POA: Diagnosis not present

## 2019-10-13 DIAGNOSIS — S72142D Displaced intertrochanteric fracture of left femur, subsequent encounter for closed fracture with routine healing: Secondary | ICD-10-CM | POA: Diagnosis not present

## 2019-10-15 DIAGNOSIS — D509 Iron deficiency anemia, unspecified: Secondary | ICD-10-CM | POA: Diagnosis not present

## 2019-10-15 DIAGNOSIS — Z1159 Encounter for screening for other viral diseases: Secondary | ICD-10-CM | POA: Diagnosis not present

## 2019-10-15 DIAGNOSIS — R0602 Shortness of breath: Secondary | ICD-10-CM | POA: Diagnosis not present

## 2019-10-15 DIAGNOSIS — D649 Anemia, unspecified: Secondary | ICD-10-CM | POA: Diagnosis not present

## 2019-10-16 DIAGNOSIS — S72142D Displaced intertrochanteric fracture of left femur, subsequent encounter for closed fracture with routine healing: Secondary | ICD-10-CM | POA: Diagnosis not present

## 2019-10-16 DIAGNOSIS — M25552 Pain in left hip: Secondary | ICD-10-CM | POA: Diagnosis not present

## 2019-10-16 DIAGNOSIS — R2681 Unsteadiness on feet: Secondary | ICD-10-CM | POA: Diagnosis not present

## 2019-10-16 DIAGNOSIS — M6281 Muscle weakness (generalized): Secondary | ICD-10-CM | POA: Diagnosis not present

## 2019-10-16 DIAGNOSIS — Z9181 History of falling: Secondary | ICD-10-CM | POA: Diagnosis not present

## 2019-10-16 DIAGNOSIS — R278 Other lack of coordination: Secondary | ICD-10-CM | POA: Diagnosis not present

## 2019-10-23 DIAGNOSIS — S72142D Displaced intertrochanteric fracture of left femur, subsequent encounter for closed fracture with routine healing: Secondary | ICD-10-CM | POA: Diagnosis not present

## 2019-10-23 DIAGNOSIS — R2681 Unsteadiness on feet: Secondary | ICD-10-CM | POA: Diagnosis not present

## 2019-10-23 DIAGNOSIS — Z9181 History of falling: Secondary | ICD-10-CM | POA: Diagnosis not present

## 2019-10-23 DIAGNOSIS — D649 Anemia, unspecified: Secondary | ICD-10-CM | POA: Diagnosis not present

## 2019-10-23 DIAGNOSIS — R079 Chest pain, unspecified: Secondary | ICD-10-CM | POA: Diagnosis not present

## 2019-10-23 DIAGNOSIS — M6281 Muscle weakness (generalized): Secondary | ICD-10-CM | POA: Diagnosis not present

## 2019-10-23 DIAGNOSIS — D509 Iron deficiency anemia, unspecified: Secondary | ICD-10-CM | POA: Diagnosis not present

## 2019-10-23 DIAGNOSIS — R278 Other lack of coordination: Secondary | ICD-10-CM | POA: Diagnosis not present

## 2019-10-23 DIAGNOSIS — Z1159 Encounter for screening for other viral diseases: Secondary | ICD-10-CM | POA: Diagnosis not present

## 2019-10-23 DIAGNOSIS — Z79899 Other long term (current) drug therapy: Secondary | ICD-10-CM | POA: Diagnosis not present

## 2019-10-23 DIAGNOSIS — M25552 Pain in left hip: Secondary | ICD-10-CM | POA: Diagnosis not present

## 2019-10-23 DIAGNOSIS — R05 Cough: Secondary | ICD-10-CM | POA: Diagnosis not present

## 2019-10-24 DIAGNOSIS — Z9181 History of falling: Secondary | ICD-10-CM | POA: Diagnosis not present

## 2019-10-24 DIAGNOSIS — M6281 Muscle weakness (generalized): Secondary | ICD-10-CM | POA: Diagnosis not present

## 2019-10-24 DIAGNOSIS — R2681 Unsteadiness on feet: Secondary | ICD-10-CM | POA: Diagnosis not present

## 2019-10-24 DIAGNOSIS — S72142D Displaced intertrochanteric fracture of left femur, subsequent encounter for closed fracture with routine healing: Secondary | ICD-10-CM | POA: Diagnosis not present

## 2019-10-24 DIAGNOSIS — M25552 Pain in left hip: Secondary | ICD-10-CM | POA: Diagnosis not present

## 2019-10-24 DIAGNOSIS — R278 Other lack of coordination: Secondary | ICD-10-CM | POA: Diagnosis not present

## 2019-10-27 DIAGNOSIS — M6281 Muscle weakness (generalized): Secondary | ICD-10-CM | POA: Diagnosis not present

## 2019-10-27 DIAGNOSIS — R278 Other lack of coordination: Secondary | ICD-10-CM | POA: Diagnosis not present

## 2019-10-27 DIAGNOSIS — Z9181 History of falling: Secondary | ICD-10-CM | POA: Diagnosis not present

## 2019-10-27 DIAGNOSIS — R2681 Unsteadiness on feet: Secondary | ICD-10-CM | POA: Diagnosis not present

## 2019-10-27 DIAGNOSIS — S72142D Displaced intertrochanteric fracture of left femur, subsequent encounter for closed fracture with routine healing: Secondary | ICD-10-CM | POA: Diagnosis not present

## 2019-10-27 DIAGNOSIS — M25552 Pain in left hip: Secondary | ICD-10-CM | POA: Diagnosis not present

## 2019-10-27 DIAGNOSIS — Z23 Encounter for immunization: Secondary | ICD-10-CM | POA: Diagnosis not present

## 2019-10-28 DIAGNOSIS — M6281 Muscle weakness (generalized): Secondary | ICD-10-CM | POA: Diagnosis not present

## 2019-10-28 DIAGNOSIS — R2681 Unsteadiness on feet: Secondary | ICD-10-CM | POA: Diagnosis not present

## 2019-10-28 DIAGNOSIS — R278 Other lack of coordination: Secondary | ICD-10-CM | POA: Diagnosis not present

## 2019-10-28 DIAGNOSIS — S72142D Displaced intertrochanteric fracture of left femur, subsequent encounter for closed fracture with routine healing: Secondary | ICD-10-CM | POA: Diagnosis not present

## 2019-10-28 DIAGNOSIS — Z9181 History of falling: Secondary | ICD-10-CM | POA: Diagnosis not present

## 2019-10-28 DIAGNOSIS — M25552 Pain in left hip: Secondary | ICD-10-CM | POA: Diagnosis not present

## 2019-10-29 DIAGNOSIS — R2681 Unsteadiness on feet: Secondary | ICD-10-CM | POA: Diagnosis not present

## 2019-10-29 DIAGNOSIS — Z9181 History of falling: Secondary | ICD-10-CM | POA: Diagnosis not present

## 2019-10-29 DIAGNOSIS — M6281 Muscle weakness (generalized): Secondary | ICD-10-CM | POA: Diagnosis not present

## 2019-10-29 DIAGNOSIS — S72142D Displaced intertrochanteric fracture of left femur, subsequent encounter for closed fracture with routine healing: Secondary | ICD-10-CM | POA: Diagnosis not present

## 2019-10-29 DIAGNOSIS — M25552 Pain in left hip: Secondary | ICD-10-CM | POA: Diagnosis not present

## 2019-10-29 DIAGNOSIS — R278 Other lack of coordination: Secondary | ICD-10-CM | POA: Diagnosis not present

## 2019-10-30 DIAGNOSIS — R278 Other lack of coordination: Secondary | ICD-10-CM | POA: Diagnosis not present

## 2019-10-30 DIAGNOSIS — S72142D Displaced intertrochanteric fracture of left femur, subsequent encounter for closed fracture with routine healing: Secondary | ICD-10-CM | POA: Diagnosis not present

## 2019-10-30 DIAGNOSIS — M25552 Pain in left hip: Secondary | ICD-10-CM | POA: Diagnosis not present

## 2019-10-30 DIAGNOSIS — M6281 Muscle weakness (generalized): Secondary | ICD-10-CM | POA: Diagnosis not present

## 2019-10-30 DIAGNOSIS — R2681 Unsteadiness on feet: Secondary | ICD-10-CM | POA: Diagnosis not present

## 2019-10-30 DIAGNOSIS — Z9181 History of falling: Secondary | ICD-10-CM | POA: Diagnosis not present

## 2019-11-03 DIAGNOSIS — K5900 Constipation, unspecified: Secondary | ICD-10-CM | POA: Diagnosis not present

## 2019-11-03 DIAGNOSIS — E079 Disorder of thyroid, unspecified: Secondary | ICD-10-CM | POA: Diagnosis not present

## 2019-11-03 DIAGNOSIS — I10 Essential (primary) hypertension: Secondary | ICD-10-CM | POA: Diagnosis not present

## 2019-11-03 DIAGNOSIS — R41841 Cognitive communication deficit: Secondary | ICD-10-CM | POA: Diagnosis not present

## 2019-11-03 DIAGNOSIS — Z9181 History of falling: Secondary | ICD-10-CM | POA: Diagnosis not present

## 2019-11-03 DIAGNOSIS — R2681 Unsteadiness on feet: Secondary | ICD-10-CM | POA: Diagnosis not present

## 2019-11-03 DIAGNOSIS — R1312 Dysphagia, oropharyngeal phase: Secondary | ICD-10-CM | POA: Diagnosis not present

## 2019-11-03 DIAGNOSIS — S72142D Displaced intertrochanteric fracture of left femur, subsequent encounter for closed fracture with routine healing: Secondary | ICD-10-CM | POA: Diagnosis not present

## 2019-11-03 DIAGNOSIS — R278 Other lack of coordination: Secondary | ICD-10-CM | POA: Diagnosis not present

## 2019-11-03 DIAGNOSIS — M25552 Pain in left hip: Secondary | ICD-10-CM | POA: Diagnosis not present

## 2019-11-03 DIAGNOSIS — Z5189 Encounter for other specified aftercare: Secondary | ICD-10-CM | POA: Diagnosis not present

## 2019-11-03 DIAGNOSIS — M545 Low back pain: Secondary | ICD-10-CM | POA: Diagnosis not present

## 2019-11-03 DIAGNOSIS — M6281 Muscle weakness (generalized): Secondary | ICD-10-CM | POA: Diagnosis not present

## 2019-11-06 DIAGNOSIS — Z9181 History of falling: Secondary | ICD-10-CM | POA: Diagnosis not present

## 2019-11-06 DIAGNOSIS — M6281 Muscle weakness (generalized): Secondary | ICD-10-CM | POA: Diagnosis not present

## 2019-11-06 DIAGNOSIS — R2681 Unsteadiness on feet: Secondary | ICD-10-CM | POA: Diagnosis not present

## 2019-11-06 DIAGNOSIS — M24572 Contracture, left ankle: Secondary | ICD-10-CM | POA: Diagnosis not present

## 2019-11-06 DIAGNOSIS — M25552 Pain in left hip: Secondary | ICD-10-CM | POA: Diagnosis not present

## 2019-11-06 DIAGNOSIS — I739 Peripheral vascular disease, unspecified: Secondary | ICD-10-CM | POA: Diagnosis not present

## 2019-11-06 DIAGNOSIS — R278 Other lack of coordination: Secondary | ICD-10-CM | POA: Diagnosis not present

## 2019-11-06 DIAGNOSIS — S72142D Displaced intertrochanteric fracture of left femur, subsequent encounter for closed fracture with routine healing: Secondary | ICD-10-CM | POA: Diagnosis not present

## 2019-11-06 DIAGNOSIS — M24571 Contracture, right ankle: Secondary | ICD-10-CM | POA: Diagnosis not present

## 2019-11-06 DIAGNOSIS — L603 Nail dystrophy: Secondary | ICD-10-CM | POA: Diagnosis not present

## 2019-11-07 DIAGNOSIS — M25552 Pain in left hip: Secondary | ICD-10-CM | POA: Diagnosis not present

## 2019-11-07 DIAGNOSIS — S72142D Displaced intertrochanteric fracture of left femur, subsequent encounter for closed fracture with routine healing: Secondary | ICD-10-CM | POA: Diagnosis not present

## 2019-11-07 DIAGNOSIS — R2681 Unsteadiness on feet: Secondary | ICD-10-CM | POA: Diagnosis not present

## 2019-11-07 DIAGNOSIS — M6281 Muscle weakness (generalized): Secondary | ICD-10-CM | POA: Diagnosis not present

## 2019-11-07 DIAGNOSIS — R278 Other lack of coordination: Secondary | ICD-10-CM | POA: Diagnosis not present

## 2019-11-07 DIAGNOSIS — Z9181 History of falling: Secondary | ICD-10-CM | POA: Diagnosis not present

## 2019-11-10 DIAGNOSIS — M25552 Pain in left hip: Secondary | ICD-10-CM | POA: Diagnosis not present

## 2019-11-10 DIAGNOSIS — M6281 Muscle weakness (generalized): Secondary | ICD-10-CM | POA: Diagnosis not present

## 2019-11-10 DIAGNOSIS — R2681 Unsteadiness on feet: Secondary | ICD-10-CM | POA: Diagnosis not present

## 2019-11-10 DIAGNOSIS — Z9181 History of falling: Secondary | ICD-10-CM | POA: Diagnosis not present

## 2019-11-10 DIAGNOSIS — R278 Other lack of coordination: Secondary | ICD-10-CM | POA: Diagnosis not present

## 2019-11-10 DIAGNOSIS — S72142D Displaced intertrochanteric fracture of left femur, subsequent encounter for closed fracture with routine healing: Secondary | ICD-10-CM | POA: Diagnosis not present

## 2019-11-12 DIAGNOSIS — M6281 Muscle weakness (generalized): Secondary | ICD-10-CM | POA: Diagnosis not present

## 2019-11-12 DIAGNOSIS — Z9181 History of falling: Secondary | ICD-10-CM | POA: Diagnosis not present

## 2019-11-12 DIAGNOSIS — M25552 Pain in left hip: Secondary | ICD-10-CM | POA: Diagnosis not present

## 2019-11-12 DIAGNOSIS — R278 Other lack of coordination: Secondary | ICD-10-CM | POA: Diagnosis not present

## 2019-11-12 DIAGNOSIS — R2681 Unsteadiness on feet: Secondary | ICD-10-CM | POA: Diagnosis not present

## 2019-11-12 DIAGNOSIS — S72142D Displaced intertrochanteric fracture of left femur, subsequent encounter for closed fracture with routine healing: Secondary | ICD-10-CM | POA: Diagnosis not present

## 2019-11-14 DIAGNOSIS — R2681 Unsteadiness on feet: Secondary | ICD-10-CM | POA: Diagnosis not present

## 2019-11-14 DIAGNOSIS — S72142D Displaced intertrochanteric fracture of left femur, subsequent encounter for closed fracture with routine healing: Secondary | ICD-10-CM | POA: Diagnosis not present

## 2019-11-14 DIAGNOSIS — M25552 Pain in left hip: Secondary | ICD-10-CM | POA: Diagnosis not present

## 2019-11-14 DIAGNOSIS — M6281 Muscle weakness (generalized): Secondary | ICD-10-CM | POA: Diagnosis not present

## 2019-11-14 DIAGNOSIS — Z9181 History of falling: Secondary | ICD-10-CM | POA: Diagnosis not present

## 2019-11-14 DIAGNOSIS — R278 Other lack of coordination: Secondary | ICD-10-CM | POA: Diagnosis not present

## 2019-11-15 DIAGNOSIS — D509 Iron deficiency anemia, unspecified: Secondary | ICD-10-CM | POA: Diagnosis not present

## 2019-11-15 DIAGNOSIS — Z79899 Other long term (current) drug therapy: Secondary | ICD-10-CM | POA: Diagnosis not present

## 2019-11-15 DIAGNOSIS — Z1159 Encounter for screening for other viral diseases: Secondary | ICD-10-CM | POA: Diagnosis not present

## 2019-11-15 DIAGNOSIS — R0602 Shortness of breath: Secondary | ICD-10-CM | POA: Diagnosis not present

## 2019-11-17 DIAGNOSIS — M6281 Muscle weakness (generalized): Secondary | ICD-10-CM | POA: Diagnosis not present

## 2019-11-17 DIAGNOSIS — Z1159 Encounter for screening for other viral diseases: Secondary | ICD-10-CM | POA: Diagnosis not present

## 2019-11-17 DIAGNOSIS — Z9181 History of falling: Secondary | ICD-10-CM | POA: Diagnosis not present

## 2019-11-17 DIAGNOSIS — D649 Anemia, unspecified: Secondary | ICD-10-CM | POA: Diagnosis not present

## 2019-11-17 DIAGNOSIS — Z79899 Other long term (current) drug therapy: Secondary | ICD-10-CM | POA: Diagnosis not present

## 2019-11-17 DIAGNOSIS — R0602 Shortness of breath: Secondary | ICD-10-CM | POA: Diagnosis not present

## 2019-11-17 DIAGNOSIS — D509 Iron deficiency anemia, unspecified: Secondary | ICD-10-CM | POA: Diagnosis not present

## 2019-11-17 DIAGNOSIS — I10 Essential (primary) hypertension: Secondary | ICD-10-CM | POA: Diagnosis not present

## 2019-11-17 DIAGNOSIS — M25552 Pain in left hip: Secondary | ICD-10-CM | POA: Diagnosis not present

## 2019-11-17 DIAGNOSIS — R2681 Unsteadiness on feet: Secondary | ICD-10-CM | POA: Diagnosis not present

## 2019-11-17 DIAGNOSIS — R278 Other lack of coordination: Secondary | ICD-10-CM | POA: Diagnosis not present

## 2019-11-17 DIAGNOSIS — S72142D Displaced intertrochanteric fracture of left femur, subsequent encounter for closed fracture with routine healing: Secondary | ICD-10-CM | POA: Diagnosis not present

## 2019-11-19 DIAGNOSIS — F419 Anxiety disorder, unspecified: Secondary | ICD-10-CM | POA: Diagnosis not present

## 2019-11-19 DIAGNOSIS — Z9181 History of falling: Secondary | ICD-10-CM | POA: Diagnosis not present

## 2019-11-19 DIAGNOSIS — R2681 Unsteadiness on feet: Secondary | ICD-10-CM | POA: Diagnosis not present

## 2019-11-19 DIAGNOSIS — M25552 Pain in left hip: Secondary | ICD-10-CM | POA: Diagnosis not present

## 2019-11-19 DIAGNOSIS — D649 Anemia, unspecified: Secondary | ICD-10-CM | POA: Diagnosis not present

## 2019-11-19 DIAGNOSIS — S72142D Displaced intertrochanteric fracture of left femur, subsequent encounter for closed fracture with routine healing: Secondary | ICD-10-CM | POA: Diagnosis not present

## 2019-11-19 DIAGNOSIS — M6281 Muscle weakness (generalized): Secondary | ICD-10-CM | POA: Diagnosis not present

## 2019-11-19 DIAGNOSIS — R278 Other lack of coordination: Secondary | ICD-10-CM | POA: Diagnosis not present

## 2019-11-19 DIAGNOSIS — I10 Essential (primary) hypertension: Secondary | ICD-10-CM | POA: Diagnosis not present

## 2019-11-19 DIAGNOSIS — G4700 Insomnia, unspecified: Secondary | ICD-10-CM | POA: Diagnosis not present

## 2019-11-19 DIAGNOSIS — F4320 Adjustment disorder, unspecified: Secondary | ICD-10-CM | POA: Diagnosis not present

## 2019-11-19 DIAGNOSIS — R0602 Shortness of breath: Secondary | ICD-10-CM | POA: Diagnosis not present

## 2019-11-21 DIAGNOSIS — S72142D Displaced intertrochanteric fracture of left femur, subsequent encounter for closed fracture with routine healing: Secondary | ICD-10-CM | POA: Diagnosis not present

## 2019-11-21 DIAGNOSIS — M25552 Pain in left hip: Secondary | ICD-10-CM | POA: Diagnosis not present

## 2019-11-21 DIAGNOSIS — M6281 Muscle weakness (generalized): Secondary | ICD-10-CM | POA: Diagnosis not present

## 2019-11-21 DIAGNOSIS — R278 Other lack of coordination: Secondary | ICD-10-CM | POA: Diagnosis not present

## 2019-11-21 DIAGNOSIS — R2681 Unsteadiness on feet: Secondary | ICD-10-CM | POA: Diagnosis not present

## 2019-11-21 DIAGNOSIS — Z9181 History of falling: Secondary | ICD-10-CM | POA: Diagnosis not present

## 2019-11-27 DIAGNOSIS — M6281 Muscle weakness (generalized): Secondary | ICD-10-CM | POA: Diagnosis not present

## 2019-11-27 DIAGNOSIS — R2681 Unsteadiness on feet: Secondary | ICD-10-CM | POA: Diagnosis not present

## 2019-11-27 DIAGNOSIS — S72142D Displaced intertrochanteric fracture of left femur, subsequent encounter for closed fracture with routine healing: Secondary | ICD-10-CM | POA: Diagnosis not present

## 2019-11-27 DIAGNOSIS — Z9181 History of falling: Secondary | ICD-10-CM | POA: Diagnosis not present

## 2019-11-27 DIAGNOSIS — M25552 Pain in left hip: Secondary | ICD-10-CM | POA: Diagnosis not present

## 2019-11-27 DIAGNOSIS — R278 Other lack of coordination: Secondary | ICD-10-CM | POA: Diagnosis not present

## 2019-11-28 DIAGNOSIS — M6281 Muscle weakness (generalized): Secondary | ICD-10-CM | POA: Diagnosis not present

## 2019-11-28 DIAGNOSIS — S72142D Displaced intertrochanteric fracture of left femur, subsequent encounter for closed fracture with routine healing: Secondary | ICD-10-CM | POA: Diagnosis not present

## 2019-11-28 DIAGNOSIS — Z9181 History of falling: Secondary | ICD-10-CM | POA: Diagnosis not present

## 2019-11-28 DIAGNOSIS — R278 Other lack of coordination: Secondary | ICD-10-CM | POA: Diagnosis not present

## 2019-11-28 DIAGNOSIS — M25552 Pain in left hip: Secondary | ICD-10-CM | POA: Diagnosis not present

## 2019-11-28 DIAGNOSIS — Z79899 Other long term (current) drug therapy: Secondary | ICD-10-CM | POA: Diagnosis not present

## 2019-11-28 DIAGNOSIS — R2681 Unsteadiness on feet: Secondary | ICD-10-CM | POA: Diagnosis not present

## 2019-12-01 DIAGNOSIS — R0602 Shortness of breath: Secondary | ICD-10-CM | POA: Diagnosis not present

## 2019-12-01 DIAGNOSIS — D649 Anemia, unspecified: Secondary | ICD-10-CM | POA: Diagnosis not present

## 2019-12-01 DIAGNOSIS — I10 Essential (primary) hypertension: Secondary | ICD-10-CM | POA: Diagnosis not present

## 2019-12-02 DIAGNOSIS — D519 Vitamin B12 deficiency anemia, unspecified: Secondary | ICD-10-CM | POA: Diagnosis not present

## 2019-12-02 DIAGNOSIS — E039 Hypothyroidism, unspecified: Secondary | ICD-10-CM | POA: Diagnosis not present

## 2019-12-02 DIAGNOSIS — D509 Iron deficiency anemia, unspecified: Secondary | ICD-10-CM | POA: Diagnosis not present

## 2019-12-02 DIAGNOSIS — E559 Vitamin D deficiency, unspecified: Secondary | ICD-10-CM | POA: Diagnosis not present

## 2019-12-02 DIAGNOSIS — Z79899 Other long term (current) drug therapy: Secondary | ICD-10-CM | POA: Diagnosis not present

## 2019-12-02 DIAGNOSIS — E079 Disorder of thyroid, unspecified: Secondary | ICD-10-CM | POA: Diagnosis not present

## 2019-12-02 DIAGNOSIS — R0602 Shortness of breath: Secondary | ICD-10-CM | POA: Diagnosis not present

## 2019-12-02 DIAGNOSIS — I10 Essential (primary) hypertension: Secondary | ICD-10-CM | POA: Diagnosis not present

## 2019-12-02 DIAGNOSIS — Z1159 Encounter for screening for other viral diseases: Secondary | ICD-10-CM | POA: Diagnosis not present

## 2019-12-02 DIAGNOSIS — D649 Anemia, unspecified: Secondary | ICD-10-CM | POA: Diagnosis not present

## 2019-12-02 DIAGNOSIS — R41841 Cognitive communication deficit: Secondary | ICD-10-CM | POA: Diagnosis not present

## 2019-12-04 DIAGNOSIS — R41841 Cognitive communication deficit: Secondary | ICD-10-CM | POA: Diagnosis not present

## 2019-12-04 DIAGNOSIS — E079 Disorder of thyroid, unspecified: Secondary | ICD-10-CM | POA: Diagnosis not present

## 2019-12-04 DIAGNOSIS — I10 Essential (primary) hypertension: Secondary | ICD-10-CM | POA: Diagnosis not present

## 2019-12-08 DIAGNOSIS — E079 Disorder of thyroid, unspecified: Secondary | ICD-10-CM | POA: Diagnosis not present

## 2019-12-08 DIAGNOSIS — R41841 Cognitive communication deficit: Secondary | ICD-10-CM | POA: Diagnosis not present

## 2019-12-08 DIAGNOSIS — I10 Essential (primary) hypertension: Secondary | ICD-10-CM | POA: Diagnosis not present

## 2019-12-09 DIAGNOSIS — E079 Disorder of thyroid, unspecified: Secondary | ICD-10-CM | POA: Diagnosis not present

## 2019-12-09 DIAGNOSIS — R41841 Cognitive communication deficit: Secondary | ICD-10-CM | POA: Diagnosis not present

## 2019-12-09 DIAGNOSIS — I10 Essential (primary) hypertension: Secondary | ICD-10-CM | POA: Diagnosis not present

## 2019-12-10 DIAGNOSIS — R41841 Cognitive communication deficit: Secondary | ICD-10-CM | POA: Diagnosis not present

## 2019-12-10 DIAGNOSIS — E079 Disorder of thyroid, unspecified: Secondary | ICD-10-CM | POA: Diagnosis not present

## 2019-12-10 DIAGNOSIS — I10 Essential (primary) hypertension: Secondary | ICD-10-CM | POA: Diagnosis not present

## 2019-12-24 DIAGNOSIS — F419 Anxiety disorder, unspecified: Secondary | ICD-10-CM | POA: Diagnosis not present

## 2019-12-24 DIAGNOSIS — F4320 Adjustment disorder, unspecified: Secondary | ICD-10-CM | POA: Diagnosis not present

## 2019-12-24 DIAGNOSIS — G4700 Insomnia, unspecified: Secondary | ICD-10-CM | POA: Diagnosis not present

## 2019-12-24 DIAGNOSIS — F39 Unspecified mood [affective] disorder: Secondary | ICD-10-CM | POA: Diagnosis not present

## 2020-01-01 DIAGNOSIS — Z79899 Other long term (current) drug therapy: Secondary | ICD-10-CM | POA: Diagnosis not present

## 2020-01-01 DIAGNOSIS — E039 Hypothyroidism, unspecified: Secondary | ICD-10-CM | POA: Diagnosis not present

## 2020-01-07 DIAGNOSIS — G4700 Insomnia, unspecified: Secondary | ICD-10-CM | POA: Diagnosis not present

## 2020-01-07 DIAGNOSIS — F419 Anxiety disorder, unspecified: Secondary | ICD-10-CM | POA: Diagnosis not present

## 2020-01-07 DIAGNOSIS — F39 Unspecified mood [affective] disorder: Secondary | ICD-10-CM | POA: Diagnosis not present

## 2020-01-30 DIAGNOSIS — F4320 Adjustment disorder, unspecified: Secondary | ICD-10-CM | POA: Diagnosis not present

## 2020-01-30 DIAGNOSIS — F39 Unspecified mood [affective] disorder: Secondary | ICD-10-CM | POA: Diagnosis not present

## 2020-01-30 DIAGNOSIS — F419 Anxiety disorder, unspecified: Secondary | ICD-10-CM | POA: Diagnosis not present

## 2020-01-30 DIAGNOSIS — G4700 Insomnia, unspecified: Secondary | ICD-10-CM | POA: Diagnosis not present

## 2020-02-06 DIAGNOSIS — G4700 Insomnia, unspecified: Secondary | ICD-10-CM | POA: Diagnosis not present

## 2020-02-06 DIAGNOSIS — F39 Unspecified mood [affective] disorder: Secondary | ICD-10-CM | POA: Diagnosis not present

## 2020-02-06 DIAGNOSIS — F419 Anxiety disorder, unspecified: Secondary | ICD-10-CM | POA: Diagnosis not present

## 2020-02-06 DIAGNOSIS — F4320 Adjustment disorder, unspecified: Secondary | ICD-10-CM | POA: Diagnosis not present

## 2020-02-13 DIAGNOSIS — F4320 Adjustment disorder, unspecified: Secondary | ICD-10-CM | POA: Diagnosis not present

## 2020-02-13 DIAGNOSIS — G4700 Insomnia, unspecified: Secondary | ICD-10-CM | POA: Diagnosis not present

## 2020-02-13 DIAGNOSIS — F39 Unspecified mood [affective] disorder: Secondary | ICD-10-CM | POA: Diagnosis not present

## 2020-02-13 DIAGNOSIS — F419 Anxiety disorder, unspecified: Secondary | ICD-10-CM | POA: Diagnosis not present

## 2020-02-20 DIAGNOSIS — F39 Unspecified mood [affective] disorder: Secondary | ICD-10-CM | POA: Diagnosis not present

## 2020-02-20 DIAGNOSIS — F419 Anxiety disorder, unspecified: Secondary | ICD-10-CM | POA: Diagnosis not present

## 2020-02-20 DIAGNOSIS — F4320 Adjustment disorder, unspecified: Secondary | ICD-10-CM | POA: Diagnosis not present

## 2020-02-20 DIAGNOSIS — G4700 Insomnia, unspecified: Secondary | ICD-10-CM | POA: Diagnosis not present

## 2020-02-27 DIAGNOSIS — F4320 Adjustment disorder, unspecified: Secondary | ICD-10-CM | POA: Diagnosis not present

## 2020-02-27 DIAGNOSIS — F419 Anxiety disorder, unspecified: Secondary | ICD-10-CM | POA: Diagnosis not present

## 2020-02-27 DIAGNOSIS — G4700 Insomnia, unspecified: Secondary | ICD-10-CM | POA: Diagnosis not present

## 2020-03-05 DIAGNOSIS — G4700 Insomnia, unspecified: Secondary | ICD-10-CM | POA: Diagnosis not present

## 2020-03-05 DIAGNOSIS — F4320 Adjustment disorder, unspecified: Secondary | ICD-10-CM | POA: Diagnosis not present

## 2020-03-05 DIAGNOSIS — F39 Unspecified mood [affective] disorder: Secondary | ICD-10-CM | POA: Diagnosis not present

## 2020-03-05 DIAGNOSIS — F419 Anxiety disorder, unspecified: Secondary | ICD-10-CM | POA: Diagnosis not present

## 2020-03-12 DIAGNOSIS — G4700 Insomnia, unspecified: Secondary | ICD-10-CM | POA: Diagnosis not present

## 2020-03-12 DIAGNOSIS — F419 Anxiety disorder, unspecified: Secondary | ICD-10-CM | POA: Diagnosis not present

## 2020-03-12 DIAGNOSIS — F4320 Adjustment disorder, unspecified: Secondary | ICD-10-CM | POA: Diagnosis not present

## 2020-03-19 DIAGNOSIS — D519 Vitamin B12 deficiency anemia, unspecified: Secondary | ICD-10-CM | POA: Diagnosis not present

## 2020-03-19 DIAGNOSIS — E039 Hypothyroidism, unspecified: Secondary | ICD-10-CM | POA: Diagnosis not present

## 2020-03-19 DIAGNOSIS — I10 Essential (primary) hypertension: Secondary | ICD-10-CM | POA: Diagnosis not present

## 2020-03-19 DIAGNOSIS — Z1159 Encounter for screening for other viral diseases: Secondary | ICD-10-CM | POA: Diagnosis not present

## 2020-03-19 DIAGNOSIS — F39 Unspecified mood [affective] disorder: Secondary | ICD-10-CM | POA: Diagnosis not present

## 2020-03-19 DIAGNOSIS — D509 Iron deficiency anemia, unspecified: Secondary | ICD-10-CM | POA: Diagnosis not present

## 2020-03-19 DIAGNOSIS — F419 Anxiety disorder, unspecified: Secondary | ICD-10-CM | POA: Diagnosis not present

## 2020-03-19 DIAGNOSIS — R0602 Shortness of breath: Secondary | ICD-10-CM | POA: Diagnosis not present

## 2020-03-19 DIAGNOSIS — E559 Vitamin D deficiency, unspecified: Secondary | ICD-10-CM | POA: Diagnosis not present

## 2020-03-19 DIAGNOSIS — G4700 Insomnia, unspecified: Secondary | ICD-10-CM | POA: Diagnosis not present

## 2020-03-19 DIAGNOSIS — Z79899 Other long term (current) drug therapy: Secondary | ICD-10-CM | POA: Diagnosis not present

## 2020-03-19 DIAGNOSIS — D649 Anemia, unspecified: Secondary | ICD-10-CM | POA: Diagnosis not present

## 2020-03-31 DIAGNOSIS — F39 Unspecified mood [affective] disorder: Secondary | ICD-10-CM | POA: Diagnosis not present

## 2020-03-31 DIAGNOSIS — G4700 Insomnia, unspecified: Secondary | ICD-10-CM | POA: Diagnosis not present

## 2020-03-31 DIAGNOSIS — F419 Anxiety disorder, unspecified: Secondary | ICD-10-CM | POA: Diagnosis not present

## 2020-04-02 DIAGNOSIS — F419 Anxiety disorder, unspecified: Secondary | ICD-10-CM | POA: Diagnosis not present

## 2020-04-02 DIAGNOSIS — G4700 Insomnia, unspecified: Secondary | ICD-10-CM | POA: Diagnosis not present

## 2020-04-02 DIAGNOSIS — F39 Unspecified mood [affective] disorder: Secondary | ICD-10-CM | POA: Diagnosis not present

## 2020-04-09 DIAGNOSIS — G4700 Insomnia, unspecified: Secondary | ICD-10-CM | POA: Diagnosis not present

## 2020-04-09 DIAGNOSIS — F419 Anxiety disorder, unspecified: Secondary | ICD-10-CM | POA: Diagnosis not present

## 2020-04-09 DIAGNOSIS — F39 Unspecified mood [affective] disorder: Secondary | ICD-10-CM | POA: Diagnosis not present

## 2020-04-23 DIAGNOSIS — F419 Anxiety disorder, unspecified: Secondary | ICD-10-CM | POA: Diagnosis not present

## 2020-04-23 DIAGNOSIS — G4700 Insomnia, unspecified: Secondary | ICD-10-CM | POA: Diagnosis not present

## 2020-04-23 DIAGNOSIS — F39 Unspecified mood [affective] disorder: Secondary | ICD-10-CM | POA: Diagnosis not present

## 2020-04-30 DIAGNOSIS — G4700 Insomnia, unspecified: Secondary | ICD-10-CM | POA: Diagnosis not present

## 2020-04-30 DIAGNOSIS — F419 Anxiety disorder, unspecified: Secondary | ICD-10-CM | POA: Diagnosis not present

## 2020-04-30 DIAGNOSIS — F39 Unspecified mood [affective] disorder: Secondary | ICD-10-CM | POA: Diagnosis not present

## 2020-05-05 DIAGNOSIS — F419 Anxiety disorder, unspecified: Secondary | ICD-10-CM | POA: Diagnosis not present

## 2020-05-05 DIAGNOSIS — G4700 Insomnia, unspecified: Secondary | ICD-10-CM | POA: Diagnosis not present

## 2020-05-05 DIAGNOSIS — F39 Unspecified mood [affective] disorder: Secondary | ICD-10-CM | POA: Diagnosis not present

## 2020-05-14 DIAGNOSIS — F39 Unspecified mood [affective] disorder: Secondary | ICD-10-CM | POA: Diagnosis not present

## 2020-05-14 DIAGNOSIS — G4700 Insomnia, unspecified: Secondary | ICD-10-CM | POA: Diagnosis not present

## 2020-05-14 DIAGNOSIS — F411 Generalized anxiety disorder: Secondary | ICD-10-CM | POA: Diagnosis not present

## 2020-05-26 DIAGNOSIS — F39 Unspecified mood [affective] disorder: Secondary | ICD-10-CM | POA: Diagnosis not present

## 2020-05-26 DIAGNOSIS — G4700 Insomnia, unspecified: Secondary | ICD-10-CM | POA: Diagnosis not present

## 2020-05-26 DIAGNOSIS — F411 Generalized anxiety disorder: Secondary | ICD-10-CM | POA: Diagnosis not present

## 2020-05-27 DIAGNOSIS — D649 Anemia, unspecified: Secondary | ICD-10-CM | POA: Diagnosis not present

## 2020-05-27 DIAGNOSIS — I10 Essential (primary) hypertension: Secondary | ICD-10-CM | POA: Diagnosis not present

## 2020-05-27 DIAGNOSIS — R69 Illness, unspecified: Secondary | ICD-10-CM | POA: Diagnosis not present

## 2020-05-27 DIAGNOSIS — I5032 Chronic diastolic (congestive) heart failure: Secondary | ICD-10-CM | POA: Diagnosis not present

## 2020-05-28 DIAGNOSIS — F411 Generalized anxiety disorder: Secondary | ICD-10-CM | POA: Diagnosis not present

## 2020-05-28 DIAGNOSIS — G4700 Insomnia, unspecified: Secondary | ICD-10-CM | POA: Diagnosis not present

## 2020-05-28 DIAGNOSIS — F39 Unspecified mood [affective] disorder: Secondary | ICD-10-CM | POA: Diagnosis not present

## 2020-06-04 DIAGNOSIS — F39 Unspecified mood [affective] disorder: Secondary | ICD-10-CM | POA: Diagnosis not present

## 2020-06-04 DIAGNOSIS — G4700 Insomnia, unspecified: Secondary | ICD-10-CM | POA: Diagnosis not present

## 2020-06-04 DIAGNOSIS — F411 Generalized anxiety disorder: Secondary | ICD-10-CM | POA: Diagnosis not present

## 2020-06-24 DIAGNOSIS — R293 Abnormal posture: Secondary | ICD-10-CM | POA: Diagnosis not present

## 2020-06-24 DIAGNOSIS — M25552 Pain in left hip: Secondary | ICD-10-CM | POA: Diagnosis not present

## 2020-06-24 DIAGNOSIS — R278 Other lack of coordination: Secondary | ICD-10-CM | POA: Diagnosis not present

## 2020-06-24 DIAGNOSIS — M6281 Muscle weakness (generalized): Secondary | ICD-10-CM | POA: Diagnosis not present

## 2020-06-24 DIAGNOSIS — F39 Unspecified mood [affective] disorder: Secondary | ICD-10-CM | POA: Diagnosis not present

## 2020-06-25 DIAGNOSIS — F39 Unspecified mood [affective] disorder: Secondary | ICD-10-CM | POA: Diagnosis not present

## 2020-06-25 DIAGNOSIS — R293 Abnormal posture: Secondary | ICD-10-CM | POA: Diagnosis not present

## 2020-06-25 DIAGNOSIS — M6281 Muscle weakness (generalized): Secondary | ICD-10-CM | POA: Diagnosis not present

## 2020-06-25 DIAGNOSIS — R278 Other lack of coordination: Secondary | ICD-10-CM | POA: Diagnosis not present

## 2020-06-25 DIAGNOSIS — M25552 Pain in left hip: Secondary | ICD-10-CM | POA: Diagnosis not present

## 2020-06-28 DIAGNOSIS — F39 Unspecified mood [affective] disorder: Secondary | ICD-10-CM | POA: Diagnosis not present

## 2020-06-28 DIAGNOSIS — R293 Abnormal posture: Secondary | ICD-10-CM | POA: Diagnosis not present

## 2020-06-28 DIAGNOSIS — R278 Other lack of coordination: Secondary | ICD-10-CM | POA: Diagnosis not present

## 2020-06-28 DIAGNOSIS — M25552 Pain in left hip: Secondary | ICD-10-CM | POA: Diagnosis not present

## 2020-06-28 DIAGNOSIS — M6281 Muscle weakness (generalized): Secondary | ICD-10-CM | POA: Diagnosis not present

## 2020-06-29 DIAGNOSIS — R293 Abnormal posture: Secondary | ICD-10-CM | POA: Diagnosis not present

## 2020-06-29 DIAGNOSIS — R278 Other lack of coordination: Secondary | ICD-10-CM | POA: Diagnosis not present

## 2020-06-29 DIAGNOSIS — F39 Unspecified mood [affective] disorder: Secondary | ICD-10-CM | POA: Diagnosis not present

## 2020-06-29 DIAGNOSIS — M25552 Pain in left hip: Secondary | ICD-10-CM | POA: Diagnosis not present

## 2020-06-29 DIAGNOSIS — M6281 Muscle weakness (generalized): Secondary | ICD-10-CM | POA: Diagnosis not present

## 2020-06-30 DIAGNOSIS — M25552 Pain in left hip: Secondary | ICD-10-CM | POA: Diagnosis not present

## 2020-06-30 DIAGNOSIS — R293 Abnormal posture: Secondary | ICD-10-CM | POA: Diagnosis not present

## 2020-06-30 DIAGNOSIS — F411 Generalized anxiety disorder: Secondary | ICD-10-CM | POA: Diagnosis not present

## 2020-06-30 DIAGNOSIS — G4700 Insomnia, unspecified: Secondary | ICD-10-CM | POA: Diagnosis not present

## 2020-06-30 DIAGNOSIS — F39 Unspecified mood [affective] disorder: Secondary | ICD-10-CM | POA: Diagnosis not present

## 2020-06-30 DIAGNOSIS — M6281 Muscle weakness (generalized): Secondary | ICD-10-CM | POA: Diagnosis not present

## 2020-06-30 DIAGNOSIS — R278 Other lack of coordination: Secondary | ICD-10-CM | POA: Diagnosis not present

## 2020-07-02 DIAGNOSIS — M6281 Muscle weakness (generalized): Secondary | ICD-10-CM | POA: Diagnosis not present

## 2020-07-02 DIAGNOSIS — M25552 Pain in left hip: Secondary | ICD-10-CM | POA: Diagnosis not present

## 2020-07-02 DIAGNOSIS — F39 Unspecified mood [affective] disorder: Secondary | ICD-10-CM | POA: Diagnosis not present

## 2020-07-02 DIAGNOSIS — R293 Abnormal posture: Secondary | ICD-10-CM | POA: Diagnosis not present

## 2020-07-04 DIAGNOSIS — I10 Essential (primary) hypertension: Secondary | ICD-10-CM | POA: Diagnosis not present

## 2020-07-04 DIAGNOSIS — I491 Atrial premature depolarization: Secondary | ICD-10-CM | POA: Diagnosis not present

## 2020-07-04 DIAGNOSIS — E669 Obesity, unspecified: Secondary | ICD-10-CM | POA: Diagnosis not present

## 2020-07-04 DIAGNOSIS — R269 Unspecified abnormalities of gait and mobility: Secondary | ICD-10-CM | POA: Diagnosis not present

## 2020-07-04 DIAGNOSIS — E039 Hypothyroidism, unspecified: Secondary | ICD-10-CM | POA: Diagnosis not present

## 2020-07-04 DIAGNOSIS — Z66 Do not resuscitate: Secondary | ICD-10-CM | POA: Diagnosis not present

## 2020-07-04 DIAGNOSIS — E785 Hyperlipidemia, unspecified: Secondary | ICD-10-CM | POA: Diagnosis not present

## 2020-07-09 DIAGNOSIS — G4700 Insomnia, unspecified: Secondary | ICD-10-CM | POA: Diagnosis not present

## 2020-07-09 DIAGNOSIS — F411 Generalized anxiety disorder: Secondary | ICD-10-CM | POA: Diagnosis not present

## 2020-07-09 DIAGNOSIS — F39 Unspecified mood [affective] disorder: Secondary | ICD-10-CM | POA: Diagnosis not present

## 2020-07-14 DIAGNOSIS — F411 Generalized anxiety disorder: Secondary | ICD-10-CM | POA: Diagnosis not present

## 2020-07-14 DIAGNOSIS — G4700 Insomnia, unspecified: Secondary | ICD-10-CM | POA: Diagnosis not present

## 2020-07-14 DIAGNOSIS — F39 Unspecified mood [affective] disorder: Secondary | ICD-10-CM | POA: Diagnosis not present

## 2020-07-28 DIAGNOSIS — R109 Unspecified abdominal pain: Secondary | ICD-10-CM | POA: Diagnosis not present

## 2020-07-28 DIAGNOSIS — G4700 Insomnia, unspecified: Secondary | ICD-10-CM | POA: Diagnosis not present

## 2020-07-28 DIAGNOSIS — F39 Unspecified mood [affective] disorder: Secondary | ICD-10-CM | POA: Diagnosis not present

## 2020-07-28 DIAGNOSIS — F411 Generalized anxiety disorder: Secondary | ICD-10-CM | POA: Diagnosis not present

## 2020-08-28 DIAGNOSIS — R079 Chest pain, unspecified: Secondary | ICD-10-CM | POA: Diagnosis not present

## 2020-08-28 DIAGNOSIS — I10 Essential (primary) hypertension: Secondary | ICD-10-CM | POA: Diagnosis not present

## 2020-08-29 DIAGNOSIS — I10 Essential (primary) hypertension: Secondary | ICD-10-CM | POA: Diagnosis not present

## 2020-09-03 DIAGNOSIS — F39 Unspecified mood [affective] disorder: Secondary | ICD-10-CM | POA: Diagnosis not present

## 2020-09-03 DIAGNOSIS — F411 Generalized anxiety disorder: Secondary | ICD-10-CM | POA: Diagnosis not present

## 2020-09-03 DIAGNOSIS — G4700 Insomnia, unspecified: Secondary | ICD-10-CM | POA: Diagnosis not present

## 2020-10-27 DIAGNOSIS — G4700 Insomnia, unspecified: Secondary | ICD-10-CM | POA: Diagnosis not present

## 2020-10-27 DIAGNOSIS — F411 Generalized anxiety disorder: Secondary | ICD-10-CM | POA: Diagnosis not present

## 2020-10-27 DIAGNOSIS — F39 Unspecified mood [affective] disorder: Secondary | ICD-10-CM | POA: Diagnosis not present

## 2020-12-14 DIAGNOSIS — I5032 Chronic diastolic (congestive) heart failure: Secondary | ICD-10-CM | POA: Diagnosis not present

## 2020-12-14 DIAGNOSIS — Z79899 Other long term (current) drug therapy: Secondary | ICD-10-CM | POA: Diagnosis not present

## 2020-12-14 DIAGNOSIS — I10 Essential (primary) hypertension: Secondary | ICD-10-CM | POA: Diagnosis not present

## 2020-12-27 DIAGNOSIS — M6281 Muscle weakness (generalized): Secondary | ICD-10-CM | POA: Diagnosis not present

## 2020-12-27 DIAGNOSIS — R278 Other lack of coordination: Secondary | ICD-10-CM | POA: Diagnosis not present

## 2020-12-27 DIAGNOSIS — R4181 Age-related cognitive decline: Secondary | ICD-10-CM | POA: Diagnosis not present

## 2020-12-27 DIAGNOSIS — S72142D Displaced intertrochanteric fracture of left femur, subsequent encounter for closed fracture with routine healing: Secondary | ICD-10-CM | POA: Diagnosis not present

## 2020-12-27 DIAGNOSIS — R2681 Unsteadiness on feet: Secondary | ICD-10-CM | POA: Diagnosis not present

## 2020-12-27 DIAGNOSIS — R293 Abnormal posture: Secondary | ICD-10-CM | POA: Diagnosis not present

## 2020-12-27 DIAGNOSIS — I10 Essential (primary) hypertension: Secondary | ICD-10-CM | POA: Diagnosis not present

## 2020-12-27 DIAGNOSIS — M25552 Pain in left hip: Secondary | ICD-10-CM | POA: Diagnosis not present

## 2020-12-28 DIAGNOSIS — M6281 Muscle weakness (generalized): Secondary | ICD-10-CM | POA: Diagnosis not present

## 2020-12-28 DIAGNOSIS — R2681 Unsteadiness on feet: Secondary | ICD-10-CM | POA: Diagnosis not present

## 2020-12-28 DIAGNOSIS — S72142D Displaced intertrochanteric fracture of left femur, subsequent encounter for closed fracture with routine healing: Secondary | ICD-10-CM | POA: Diagnosis not present

## 2020-12-28 DIAGNOSIS — R293 Abnormal posture: Secondary | ICD-10-CM | POA: Diagnosis not present

## 2020-12-28 DIAGNOSIS — I10 Essential (primary) hypertension: Secondary | ICD-10-CM | POA: Diagnosis not present

## 2020-12-28 DIAGNOSIS — R278 Other lack of coordination: Secondary | ICD-10-CM | POA: Diagnosis not present

## 2020-12-29 DIAGNOSIS — R2681 Unsteadiness on feet: Secondary | ICD-10-CM | POA: Diagnosis not present

## 2020-12-29 DIAGNOSIS — S72142D Displaced intertrochanteric fracture of left femur, subsequent encounter for closed fracture with routine healing: Secondary | ICD-10-CM | POA: Diagnosis not present

## 2020-12-29 DIAGNOSIS — R278 Other lack of coordination: Secondary | ICD-10-CM | POA: Diagnosis not present

## 2020-12-29 DIAGNOSIS — M6281 Muscle weakness (generalized): Secondary | ICD-10-CM | POA: Diagnosis not present

## 2020-12-29 DIAGNOSIS — R293 Abnormal posture: Secondary | ICD-10-CM | POA: Diagnosis not present

## 2020-12-29 DIAGNOSIS — I10 Essential (primary) hypertension: Secondary | ICD-10-CM | POA: Diagnosis not present

## 2020-12-30 DIAGNOSIS — R293 Abnormal posture: Secondary | ICD-10-CM | POA: Diagnosis not present

## 2020-12-30 DIAGNOSIS — M6281 Muscle weakness (generalized): Secondary | ICD-10-CM | POA: Diagnosis not present

## 2020-12-30 DIAGNOSIS — R2681 Unsteadiness on feet: Secondary | ICD-10-CM | POA: Diagnosis not present

## 2020-12-30 DIAGNOSIS — R278 Other lack of coordination: Secondary | ICD-10-CM | POA: Diagnosis not present

## 2020-12-30 DIAGNOSIS — S72142D Displaced intertrochanteric fracture of left femur, subsequent encounter for closed fracture with routine healing: Secondary | ICD-10-CM | POA: Diagnosis not present

## 2020-12-30 DIAGNOSIS — I10 Essential (primary) hypertension: Secondary | ICD-10-CM | POA: Diagnosis not present

## 2020-12-31 DIAGNOSIS — M6281 Muscle weakness (generalized): Secondary | ICD-10-CM | POA: Diagnosis not present

## 2020-12-31 DIAGNOSIS — R278 Other lack of coordination: Secondary | ICD-10-CM | POA: Diagnosis not present

## 2020-12-31 DIAGNOSIS — R2681 Unsteadiness on feet: Secondary | ICD-10-CM | POA: Diagnosis not present

## 2020-12-31 DIAGNOSIS — M25552 Pain in left hip: Secondary | ICD-10-CM | POA: Diagnosis not present

## 2020-12-31 DIAGNOSIS — R293 Abnormal posture: Secondary | ICD-10-CM | POA: Diagnosis not present

## 2020-12-31 DIAGNOSIS — I10 Essential (primary) hypertension: Secondary | ICD-10-CM | POA: Diagnosis not present

## 2020-12-31 DIAGNOSIS — S72142D Displaced intertrochanteric fracture of left femur, subsequent encounter for closed fracture with routine healing: Secondary | ICD-10-CM | POA: Diagnosis not present

## 2020-12-31 DIAGNOSIS — R4181 Age-related cognitive decline: Secondary | ICD-10-CM | POA: Diagnosis not present

## 2021-01-04 DIAGNOSIS — R2681 Unsteadiness on feet: Secondary | ICD-10-CM | POA: Diagnosis not present

## 2021-01-04 DIAGNOSIS — R293 Abnormal posture: Secondary | ICD-10-CM | POA: Diagnosis not present

## 2021-01-04 DIAGNOSIS — I10 Essential (primary) hypertension: Secondary | ICD-10-CM | POA: Diagnosis not present

## 2021-01-04 DIAGNOSIS — M6281 Muscle weakness (generalized): Secondary | ICD-10-CM | POA: Diagnosis not present

## 2021-01-04 DIAGNOSIS — S72142D Displaced intertrochanteric fracture of left femur, subsequent encounter for closed fracture with routine healing: Secondary | ICD-10-CM | POA: Diagnosis not present

## 2021-01-04 DIAGNOSIS — R278 Other lack of coordination: Secondary | ICD-10-CM | POA: Diagnosis not present

## 2021-01-05 DIAGNOSIS — F411 Generalized anxiety disorder: Secondary | ICD-10-CM | POA: Diagnosis not present

## 2021-01-05 DIAGNOSIS — G4700 Insomnia, unspecified: Secondary | ICD-10-CM | POA: Diagnosis not present

## 2021-01-05 DIAGNOSIS — F39 Unspecified mood [affective] disorder: Secondary | ICD-10-CM | POA: Diagnosis not present

## 2021-01-06 DIAGNOSIS — I10 Essential (primary) hypertension: Secondary | ICD-10-CM | POA: Diagnosis not present

## 2021-01-06 DIAGNOSIS — M6281 Muscle weakness (generalized): Secondary | ICD-10-CM | POA: Diagnosis not present

## 2021-01-06 DIAGNOSIS — R2681 Unsteadiness on feet: Secondary | ICD-10-CM | POA: Diagnosis not present

## 2021-01-06 DIAGNOSIS — R278 Other lack of coordination: Secondary | ICD-10-CM | POA: Diagnosis not present

## 2021-01-06 DIAGNOSIS — R293 Abnormal posture: Secondary | ICD-10-CM | POA: Diagnosis not present

## 2021-01-06 DIAGNOSIS — S72142D Displaced intertrochanteric fracture of left femur, subsequent encounter for closed fracture with routine healing: Secondary | ICD-10-CM | POA: Diagnosis not present

## 2021-01-10 DIAGNOSIS — R2681 Unsteadiness on feet: Secondary | ICD-10-CM | POA: Diagnosis not present

## 2021-01-10 DIAGNOSIS — S72142D Displaced intertrochanteric fracture of left femur, subsequent encounter for closed fracture with routine healing: Secondary | ICD-10-CM | POA: Diagnosis not present

## 2021-01-10 DIAGNOSIS — I10 Essential (primary) hypertension: Secondary | ICD-10-CM | POA: Diagnosis not present

## 2021-01-10 DIAGNOSIS — M6281 Muscle weakness (generalized): Secondary | ICD-10-CM | POA: Diagnosis not present

## 2021-01-10 DIAGNOSIS — R293 Abnormal posture: Secondary | ICD-10-CM | POA: Diagnosis not present

## 2021-01-10 DIAGNOSIS — R278 Other lack of coordination: Secondary | ICD-10-CM | POA: Diagnosis not present

## 2021-01-11 DIAGNOSIS — R278 Other lack of coordination: Secondary | ICD-10-CM | POA: Diagnosis not present

## 2021-01-11 DIAGNOSIS — R2681 Unsteadiness on feet: Secondary | ICD-10-CM | POA: Diagnosis not present

## 2021-01-11 DIAGNOSIS — S72142D Displaced intertrochanteric fracture of left femur, subsequent encounter for closed fracture with routine healing: Secondary | ICD-10-CM | POA: Diagnosis not present

## 2021-01-11 DIAGNOSIS — R293 Abnormal posture: Secondary | ICD-10-CM | POA: Diagnosis not present

## 2021-01-11 DIAGNOSIS — M6281 Muscle weakness (generalized): Secondary | ICD-10-CM | POA: Diagnosis not present

## 2021-01-11 DIAGNOSIS — I10 Essential (primary) hypertension: Secondary | ICD-10-CM | POA: Diagnosis not present

## 2021-02-23 DIAGNOSIS — F411 Generalized anxiety disorder: Secondary | ICD-10-CM | POA: Diagnosis not present

## 2021-02-23 DIAGNOSIS — F5101 Primary insomnia: Secondary | ICD-10-CM | POA: Diagnosis not present

## 2021-02-23 DIAGNOSIS — F39 Unspecified mood [affective] disorder: Secondary | ICD-10-CM | POA: Diagnosis not present

## 2021-03-22 DIAGNOSIS — R293 Abnormal posture: Secondary | ICD-10-CM | POA: Diagnosis not present

## 2021-03-22 DIAGNOSIS — I499 Cardiac arrhythmia, unspecified: Secondary | ICD-10-CM | POA: Diagnosis not present

## 2021-03-22 DIAGNOSIS — M545 Low back pain, unspecified: Secondary | ICD-10-CM | POA: Diagnosis not present

## 2021-03-22 DIAGNOSIS — Z9181 History of falling: Secondary | ICD-10-CM | POA: Diagnosis not present

## 2021-03-22 DIAGNOSIS — R279 Unspecified lack of coordination: Secondary | ICD-10-CM | POA: Diagnosis not present

## 2021-03-22 DIAGNOSIS — R4181 Age-related cognitive decline: Secondary | ICD-10-CM | POA: Diagnosis not present

## 2021-03-22 DIAGNOSIS — M6281 Muscle weakness (generalized): Secondary | ICD-10-CM | POA: Diagnosis not present

## 2021-03-22 DIAGNOSIS — R2681 Unsteadiness on feet: Secondary | ICD-10-CM | POA: Diagnosis not present

## 2021-03-22 DIAGNOSIS — S72142D Displaced intertrochanteric fracture of left femur, subsequent encounter for closed fracture with routine healing: Secondary | ICD-10-CM | POA: Diagnosis not present

## 2021-03-23 DIAGNOSIS — R4181 Age-related cognitive decline: Secondary | ICD-10-CM | POA: Diagnosis not present

## 2021-03-23 DIAGNOSIS — S72142D Displaced intertrochanteric fracture of left femur, subsequent encounter for closed fracture with routine healing: Secondary | ICD-10-CM | POA: Diagnosis not present

## 2021-03-23 DIAGNOSIS — R293 Abnormal posture: Secondary | ICD-10-CM | POA: Diagnosis not present

## 2021-03-23 DIAGNOSIS — R279 Unspecified lack of coordination: Secondary | ICD-10-CM | POA: Diagnosis not present

## 2021-03-23 DIAGNOSIS — M6281 Muscle weakness (generalized): Secondary | ICD-10-CM | POA: Diagnosis not present

## 2021-03-23 DIAGNOSIS — R2681 Unsteadiness on feet: Secondary | ICD-10-CM | POA: Diagnosis not present

## 2021-03-24 DIAGNOSIS — R2681 Unsteadiness on feet: Secondary | ICD-10-CM | POA: Diagnosis not present

## 2021-03-24 DIAGNOSIS — R293 Abnormal posture: Secondary | ICD-10-CM | POA: Diagnosis not present

## 2021-03-24 DIAGNOSIS — S72142D Displaced intertrochanteric fracture of left femur, subsequent encounter for closed fracture with routine healing: Secondary | ICD-10-CM | POA: Diagnosis not present

## 2021-03-24 DIAGNOSIS — R4181 Age-related cognitive decline: Secondary | ICD-10-CM | POA: Diagnosis not present

## 2021-03-24 DIAGNOSIS — R279 Unspecified lack of coordination: Secondary | ICD-10-CM | POA: Diagnosis not present

## 2021-03-24 DIAGNOSIS — M6281 Muscle weakness (generalized): Secondary | ICD-10-CM | POA: Diagnosis not present

## 2021-03-25 DIAGNOSIS — R4181 Age-related cognitive decline: Secondary | ICD-10-CM | POA: Diagnosis not present

## 2021-03-25 DIAGNOSIS — S72142D Displaced intertrochanteric fracture of left femur, subsequent encounter for closed fracture with routine healing: Secondary | ICD-10-CM | POA: Diagnosis not present

## 2021-03-25 DIAGNOSIS — R293 Abnormal posture: Secondary | ICD-10-CM | POA: Diagnosis not present

## 2021-03-25 DIAGNOSIS — R279 Unspecified lack of coordination: Secondary | ICD-10-CM | POA: Diagnosis not present

## 2021-03-25 DIAGNOSIS — M6281 Muscle weakness (generalized): Secondary | ICD-10-CM | POA: Diagnosis not present

## 2021-03-25 DIAGNOSIS — R2681 Unsteadiness on feet: Secondary | ICD-10-CM | POA: Diagnosis not present

## 2021-03-28 DIAGNOSIS — S72142D Displaced intertrochanteric fracture of left femur, subsequent encounter for closed fracture with routine healing: Secondary | ICD-10-CM | POA: Diagnosis not present

## 2021-03-28 DIAGNOSIS — R279 Unspecified lack of coordination: Secondary | ICD-10-CM | POA: Diagnosis not present

## 2021-03-28 DIAGNOSIS — R293 Abnormal posture: Secondary | ICD-10-CM | POA: Diagnosis not present

## 2021-03-28 DIAGNOSIS — M6281 Muscle weakness (generalized): Secondary | ICD-10-CM | POA: Diagnosis not present

## 2021-03-28 DIAGNOSIS — R4181 Age-related cognitive decline: Secondary | ICD-10-CM | POA: Diagnosis not present

## 2021-03-28 DIAGNOSIS — R2681 Unsteadiness on feet: Secondary | ICD-10-CM | POA: Diagnosis not present

## 2021-03-30 DIAGNOSIS — H04123 Dry eye syndrome of bilateral lacrimal glands: Secondary | ICD-10-CM | POA: Diagnosis not present

## 2021-03-30 DIAGNOSIS — Z961 Presence of intraocular lens: Secondary | ICD-10-CM | POA: Diagnosis not present

## 2021-03-30 DIAGNOSIS — I10 Essential (primary) hypertension: Secondary | ICD-10-CM | POA: Diagnosis not present

## 2021-04-01 DIAGNOSIS — I499 Cardiac arrhythmia, unspecified: Secondary | ICD-10-CM | POA: Diagnosis not present

## 2021-04-01 DIAGNOSIS — R279 Unspecified lack of coordination: Secondary | ICD-10-CM | POA: Diagnosis not present

## 2021-04-01 DIAGNOSIS — R4181 Age-related cognitive decline: Secondary | ICD-10-CM | POA: Diagnosis not present

## 2021-04-01 DIAGNOSIS — Z9181 History of falling: Secondary | ICD-10-CM | POA: Diagnosis not present

## 2021-04-01 DIAGNOSIS — R2681 Unsteadiness on feet: Secondary | ICD-10-CM | POA: Diagnosis not present

## 2021-04-01 DIAGNOSIS — R293 Abnormal posture: Secondary | ICD-10-CM | POA: Diagnosis not present

## 2021-04-01 DIAGNOSIS — S72142D Displaced intertrochanteric fracture of left femur, subsequent encounter for closed fracture with routine healing: Secondary | ICD-10-CM | POA: Diagnosis not present

## 2021-04-01 DIAGNOSIS — M6281 Muscle weakness (generalized): Secondary | ICD-10-CM | POA: Diagnosis not present

## 2021-04-01 DIAGNOSIS — M545 Low back pain, unspecified: Secondary | ICD-10-CM | POA: Diagnosis not present

## 2021-04-04 DIAGNOSIS — R293 Abnormal posture: Secondary | ICD-10-CM | POA: Diagnosis not present

## 2021-04-04 DIAGNOSIS — R4181 Age-related cognitive decline: Secondary | ICD-10-CM | POA: Diagnosis not present

## 2021-04-04 DIAGNOSIS — M545 Low back pain, unspecified: Secondary | ICD-10-CM | POA: Diagnosis not present

## 2021-04-04 DIAGNOSIS — R279 Unspecified lack of coordination: Secondary | ICD-10-CM | POA: Diagnosis not present

## 2021-04-04 DIAGNOSIS — I499 Cardiac arrhythmia, unspecified: Secondary | ICD-10-CM | POA: Diagnosis not present

## 2021-04-04 DIAGNOSIS — S72142D Displaced intertrochanteric fracture of left femur, subsequent encounter for closed fracture with routine healing: Secondary | ICD-10-CM | POA: Diagnosis not present

## 2021-04-06 DIAGNOSIS — I499 Cardiac arrhythmia, unspecified: Secondary | ICD-10-CM | POA: Diagnosis not present

## 2021-04-06 DIAGNOSIS — R4181 Age-related cognitive decline: Secondary | ICD-10-CM | POA: Diagnosis not present

## 2021-04-06 DIAGNOSIS — M545 Low back pain, unspecified: Secondary | ICD-10-CM | POA: Diagnosis not present

## 2021-04-06 DIAGNOSIS — R279 Unspecified lack of coordination: Secondary | ICD-10-CM | POA: Diagnosis not present

## 2021-04-06 DIAGNOSIS — S72142D Displaced intertrochanteric fracture of left femur, subsequent encounter for closed fracture with routine healing: Secondary | ICD-10-CM | POA: Diagnosis not present

## 2021-04-06 DIAGNOSIS — R293 Abnormal posture: Secondary | ICD-10-CM | POA: Diagnosis not present

## 2021-04-11 DIAGNOSIS — R279 Unspecified lack of coordination: Secondary | ICD-10-CM | POA: Diagnosis not present

## 2021-04-11 DIAGNOSIS — S72142D Displaced intertrochanteric fracture of left femur, subsequent encounter for closed fracture with routine healing: Secondary | ICD-10-CM | POA: Diagnosis not present

## 2021-04-11 DIAGNOSIS — R4181 Age-related cognitive decline: Secondary | ICD-10-CM | POA: Diagnosis not present

## 2021-04-11 DIAGNOSIS — R293 Abnormal posture: Secondary | ICD-10-CM | POA: Diagnosis not present

## 2021-04-11 DIAGNOSIS — M545 Low back pain, unspecified: Secondary | ICD-10-CM | POA: Diagnosis not present

## 2021-04-11 DIAGNOSIS — I499 Cardiac arrhythmia, unspecified: Secondary | ICD-10-CM | POA: Diagnosis not present

## 2021-04-13 DIAGNOSIS — M545 Low back pain, unspecified: Secondary | ICD-10-CM | POA: Diagnosis not present

## 2021-04-13 DIAGNOSIS — I499 Cardiac arrhythmia, unspecified: Secondary | ICD-10-CM | POA: Diagnosis not present

## 2021-04-13 DIAGNOSIS — R279 Unspecified lack of coordination: Secondary | ICD-10-CM | POA: Diagnosis not present

## 2021-04-13 DIAGNOSIS — S72142D Displaced intertrochanteric fracture of left femur, subsequent encounter for closed fracture with routine healing: Secondary | ICD-10-CM | POA: Diagnosis not present

## 2021-04-13 DIAGNOSIS — R4181 Age-related cognitive decline: Secondary | ICD-10-CM | POA: Diagnosis not present

## 2021-04-13 DIAGNOSIS — R293 Abnormal posture: Secondary | ICD-10-CM | POA: Diagnosis not present

## 2021-05-04 DIAGNOSIS — F5101 Primary insomnia: Secondary | ICD-10-CM | POA: Diagnosis not present

## 2021-05-04 DIAGNOSIS — F39 Unspecified mood [affective] disorder: Secondary | ICD-10-CM | POA: Diagnosis not present

## 2021-05-04 DIAGNOSIS — F411 Generalized anxiety disorder: Secondary | ICD-10-CM | POA: Diagnosis not present

## 2021-06-20 DIAGNOSIS — M6281 Muscle weakness (generalized): Secondary | ICD-10-CM | POA: Diagnosis not present

## 2021-06-20 DIAGNOSIS — Z9181 History of falling: Secondary | ICD-10-CM | POA: Diagnosis not present

## 2021-06-20 DIAGNOSIS — I499 Cardiac arrhythmia, unspecified: Secondary | ICD-10-CM | POA: Diagnosis not present

## 2021-06-20 DIAGNOSIS — R4181 Age-related cognitive decline: Secondary | ICD-10-CM | POA: Diagnosis not present

## 2021-06-20 DIAGNOSIS — R293 Abnormal posture: Secondary | ICD-10-CM | POA: Diagnosis not present

## 2021-06-20 DIAGNOSIS — S72142D Displaced intertrochanteric fracture of left femur, subsequent encounter for closed fracture with routine healing: Secondary | ICD-10-CM | POA: Diagnosis not present

## 2021-06-21 DIAGNOSIS — M6281 Muscle weakness (generalized): Secondary | ICD-10-CM | POA: Diagnosis not present

## 2021-06-21 DIAGNOSIS — R293 Abnormal posture: Secondary | ICD-10-CM | POA: Diagnosis not present

## 2021-06-21 DIAGNOSIS — I499 Cardiac arrhythmia, unspecified: Secondary | ICD-10-CM | POA: Diagnosis not present

## 2021-06-21 DIAGNOSIS — R4181 Age-related cognitive decline: Secondary | ICD-10-CM | POA: Diagnosis not present

## 2021-06-21 DIAGNOSIS — S72142D Displaced intertrochanteric fracture of left femur, subsequent encounter for closed fracture with routine healing: Secondary | ICD-10-CM | POA: Diagnosis not present

## 2021-06-21 DIAGNOSIS — Z9181 History of falling: Secondary | ICD-10-CM | POA: Diagnosis not present

## 2021-06-22 DIAGNOSIS — Z9181 History of falling: Secondary | ICD-10-CM | POA: Diagnosis not present

## 2021-06-22 DIAGNOSIS — I499 Cardiac arrhythmia, unspecified: Secondary | ICD-10-CM | POA: Diagnosis not present

## 2021-06-22 DIAGNOSIS — R4181 Age-related cognitive decline: Secondary | ICD-10-CM | POA: Diagnosis not present

## 2021-06-22 DIAGNOSIS — M6281 Muscle weakness (generalized): Secondary | ICD-10-CM | POA: Diagnosis not present

## 2021-06-22 DIAGNOSIS — R293 Abnormal posture: Secondary | ICD-10-CM | POA: Diagnosis not present

## 2021-06-22 DIAGNOSIS — S72142D Displaced intertrochanteric fracture of left femur, subsequent encounter for closed fracture with routine healing: Secondary | ICD-10-CM | POA: Diagnosis not present

## 2021-06-23 DIAGNOSIS — M6281 Muscle weakness (generalized): Secondary | ICD-10-CM | POA: Diagnosis not present

## 2021-06-23 DIAGNOSIS — R4181 Age-related cognitive decline: Secondary | ICD-10-CM | POA: Diagnosis not present

## 2021-06-23 DIAGNOSIS — E039 Hypothyroidism, unspecified: Secondary | ICD-10-CM | POA: Diagnosis not present

## 2021-06-23 DIAGNOSIS — I5032 Chronic diastolic (congestive) heart failure: Secondary | ICD-10-CM | POA: Diagnosis not present

## 2021-06-23 DIAGNOSIS — Z9181 History of falling: Secondary | ICD-10-CM | POA: Diagnosis not present

## 2021-06-23 DIAGNOSIS — R293 Abnormal posture: Secondary | ICD-10-CM | POA: Diagnosis not present

## 2021-06-23 DIAGNOSIS — I10 Essential (primary) hypertension: Secondary | ICD-10-CM | POA: Diagnosis not present

## 2021-06-23 DIAGNOSIS — I499 Cardiac arrhythmia, unspecified: Secondary | ICD-10-CM | POA: Diagnosis not present

## 2021-06-23 DIAGNOSIS — S72142D Displaced intertrochanteric fracture of left femur, subsequent encounter for closed fracture with routine healing: Secondary | ICD-10-CM | POA: Diagnosis not present

## 2021-06-24 DIAGNOSIS — R4181 Age-related cognitive decline: Secondary | ICD-10-CM | POA: Diagnosis not present

## 2021-06-24 DIAGNOSIS — S72142D Displaced intertrochanteric fracture of left femur, subsequent encounter for closed fracture with routine healing: Secondary | ICD-10-CM | POA: Diagnosis not present

## 2021-06-24 DIAGNOSIS — Z9181 History of falling: Secondary | ICD-10-CM | POA: Diagnosis not present

## 2021-06-24 DIAGNOSIS — M6281 Muscle weakness (generalized): Secondary | ICD-10-CM | POA: Diagnosis not present

## 2021-06-24 DIAGNOSIS — R293 Abnormal posture: Secondary | ICD-10-CM | POA: Diagnosis not present

## 2021-06-24 DIAGNOSIS — I499 Cardiac arrhythmia, unspecified: Secondary | ICD-10-CM | POA: Diagnosis not present

## 2021-06-27 DIAGNOSIS — Z9181 History of falling: Secondary | ICD-10-CM | POA: Diagnosis not present

## 2021-06-27 DIAGNOSIS — M6281 Muscle weakness (generalized): Secondary | ICD-10-CM | POA: Diagnosis not present

## 2021-06-27 DIAGNOSIS — I499 Cardiac arrhythmia, unspecified: Secondary | ICD-10-CM | POA: Diagnosis not present

## 2021-06-27 DIAGNOSIS — S72142D Displaced intertrochanteric fracture of left femur, subsequent encounter for closed fracture with routine healing: Secondary | ICD-10-CM | POA: Diagnosis not present

## 2021-06-27 DIAGNOSIS — R4181 Age-related cognitive decline: Secondary | ICD-10-CM | POA: Diagnosis not present

## 2021-06-27 DIAGNOSIS — R293 Abnormal posture: Secondary | ICD-10-CM | POA: Diagnosis not present

## 2021-08-03 DIAGNOSIS — F5101 Primary insomnia: Secondary | ICD-10-CM | POA: Diagnosis not present

## 2021-08-03 DIAGNOSIS — F39 Unspecified mood [affective] disorder: Secondary | ICD-10-CM | POA: Diagnosis not present

## 2021-08-03 DIAGNOSIS — F418 Other specified anxiety disorders: Secondary | ICD-10-CM | POA: Diagnosis not present

## 2021-09-16 DIAGNOSIS — E860 Dehydration: Secondary | ICD-10-CM | POA: Diagnosis not present

## 2021-09-16 DIAGNOSIS — R059 Cough, unspecified: Secondary | ICD-10-CM | POA: Diagnosis not present

## 2021-09-27 DIAGNOSIS — M25562 Pain in left knee: Secondary | ICD-10-CM | POA: Diagnosis not present

## 2021-10-19 DIAGNOSIS — F39 Unspecified mood [affective] disorder: Secondary | ICD-10-CM | POA: Diagnosis not present

## 2021-10-19 DIAGNOSIS — F5101 Primary insomnia: Secondary | ICD-10-CM | POA: Diagnosis not present

## 2021-10-19 DIAGNOSIS — F418 Other specified anxiety disorders: Secondary | ICD-10-CM | POA: Diagnosis not present

## 2021-11-02 DIAGNOSIS — F418 Other specified anxiety disorders: Secondary | ICD-10-CM | POA: Diagnosis not present

## 2021-11-02 DIAGNOSIS — F5101 Primary insomnia: Secondary | ICD-10-CM | POA: Diagnosis not present

## 2021-11-02 DIAGNOSIS — F39 Unspecified mood [affective] disorder: Secondary | ICD-10-CM | POA: Diagnosis not present

## 2021-11-07 DIAGNOSIS — I5032 Chronic diastolic (congestive) heart failure: Secondary | ICD-10-CM | POA: Diagnosis not present

## 2021-12-28 DIAGNOSIS — F5101 Primary insomnia: Secondary | ICD-10-CM | POA: Diagnosis not present

## 2021-12-28 DIAGNOSIS — F39 Unspecified mood [affective] disorder: Secondary | ICD-10-CM | POA: Diagnosis not present

## 2021-12-28 DIAGNOSIS — F411 Generalized anxiety disorder: Secondary | ICD-10-CM | POA: Diagnosis not present

## 2022-02-01 DIAGNOSIS — F5101 Primary insomnia: Secondary | ICD-10-CM | POA: Diagnosis not present

## 2022-02-02 DIAGNOSIS — S91101A Unspecified open wound of right great toe without damage to nail, initial encounter: Secondary | ICD-10-CM | POA: Diagnosis not present

## 2022-02-08 DIAGNOSIS — S91101A Unspecified open wound of right great toe without damage to nail, initial encounter: Secondary | ICD-10-CM | POA: Diagnosis not present

## 2022-02-15 DIAGNOSIS — S91101A Unspecified open wound of right great toe without damage to nail, initial encounter: Secondary | ICD-10-CM | POA: Diagnosis not present

## 2022-02-22 DIAGNOSIS — S91101A Unspecified open wound of right great toe without damage to nail, initial encounter: Secondary | ICD-10-CM | POA: Diagnosis not present

## 2022-03-01 DIAGNOSIS — S91101A Unspecified open wound of right great toe without damage to nail, initial encounter: Secondary | ICD-10-CM | POA: Diagnosis not present

## 2022-03-08 DIAGNOSIS — S91101A Unspecified open wound of right great toe without damage to nail, initial encounter: Secondary | ICD-10-CM | POA: Diagnosis not present

## 2022-03-11 DIAGNOSIS — Z79899 Other long term (current) drug therapy: Secondary | ICD-10-CM | POA: Diagnosis not present

## 2022-03-13 DIAGNOSIS — S72142D Displaced intertrochanteric fracture of left femur, subsequent encounter for closed fracture with routine healing: Secondary | ICD-10-CM | POA: Diagnosis not present

## 2022-03-13 DIAGNOSIS — I5030 Unspecified diastolic (congestive) heart failure: Secondary | ICD-10-CM | POA: Diagnosis not present

## 2022-03-13 DIAGNOSIS — M6281 Muscle weakness (generalized): Secondary | ICD-10-CM | POA: Diagnosis not present

## 2022-03-13 DIAGNOSIS — R293 Abnormal posture: Secondary | ICD-10-CM | POA: Diagnosis not present

## 2022-03-13 DIAGNOSIS — R41841 Cognitive communication deficit: Secondary | ICD-10-CM | POA: Diagnosis not present

## 2022-03-14 DIAGNOSIS — S72142D Displaced intertrochanteric fracture of left femur, subsequent encounter for closed fracture with routine healing: Secondary | ICD-10-CM | POA: Diagnosis not present

## 2022-03-14 DIAGNOSIS — I5030 Unspecified diastolic (congestive) heart failure: Secondary | ICD-10-CM | POA: Diagnosis not present

## 2022-03-14 DIAGNOSIS — M6281 Muscle weakness (generalized): Secondary | ICD-10-CM | POA: Diagnosis not present

## 2022-03-14 DIAGNOSIS — R41841 Cognitive communication deficit: Secondary | ICD-10-CM | POA: Diagnosis not present

## 2022-03-14 DIAGNOSIS — R293 Abnormal posture: Secondary | ICD-10-CM | POA: Diagnosis not present

## 2022-03-15 DIAGNOSIS — R293 Abnormal posture: Secondary | ICD-10-CM | POA: Diagnosis not present

## 2022-03-15 DIAGNOSIS — S91101A Unspecified open wound of right great toe without damage to nail, initial encounter: Secondary | ICD-10-CM | POA: Diagnosis not present

## 2022-03-15 DIAGNOSIS — I5030 Unspecified diastolic (congestive) heart failure: Secondary | ICD-10-CM | POA: Diagnosis not present

## 2022-03-15 DIAGNOSIS — S72142D Displaced intertrochanteric fracture of left femur, subsequent encounter for closed fracture with routine healing: Secondary | ICD-10-CM | POA: Diagnosis not present

## 2022-03-15 DIAGNOSIS — M6281 Muscle weakness (generalized): Secondary | ICD-10-CM | POA: Diagnosis not present

## 2022-03-15 DIAGNOSIS — R41841 Cognitive communication deficit: Secondary | ICD-10-CM | POA: Diagnosis not present

## 2022-03-16 DIAGNOSIS — I5030 Unspecified diastolic (congestive) heart failure: Secondary | ICD-10-CM | POA: Diagnosis not present

## 2022-03-16 DIAGNOSIS — R41841 Cognitive communication deficit: Secondary | ICD-10-CM | POA: Diagnosis not present

## 2022-03-16 DIAGNOSIS — R293 Abnormal posture: Secondary | ICD-10-CM | POA: Diagnosis not present

## 2022-03-16 DIAGNOSIS — S72142D Displaced intertrochanteric fracture of left femur, subsequent encounter for closed fracture with routine healing: Secondary | ICD-10-CM | POA: Diagnosis not present

## 2022-03-16 DIAGNOSIS — M6281 Muscle weakness (generalized): Secondary | ICD-10-CM | POA: Diagnosis not present

## 2022-03-17 DIAGNOSIS — S72142D Displaced intertrochanteric fracture of left femur, subsequent encounter for closed fracture with routine healing: Secondary | ICD-10-CM | POA: Diagnosis not present

## 2022-03-17 DIAGNOSIS — I5030 Unspecified diastolic (congestive) heart failure: Secondary | ICD-10-CM | POA: Diagnosis not present

## 2022-03-17 DIAGNOSIS — R41841 Cognitive communication deficit: Secondary | ICD-10-CM | POA: Diagnosis not present

## 2022-03-17 DIAGNOSIS — M6281 Muscle weakness (generalized): Secondary | ICD-10-CM | POA: Diagnosis not present

## 2022-03-17 DIAGNOSIS — R293 Abnormal posture: Secondary | ICD-10-CM | POA: Diagnosis not present

## 2022-03-20 DIAGNOSIS — M6281 Muscle weakness (generalized): Secondary | ICD-10-CM | POA: Diagnosis not present

## 2022-03-20 DIAGNOSIS — S72142D Displaced intertrochanteric fracture of left femur, subsequent encounter for closed fracture with routine healing: Secondary | ICD-10-CM | POA: Diagnosis not present

## 2022-03-20 DIAGNOSIS — R41841 Cognitive communication deficit: Secondary | ICD-10-CM | POA: Diagnosis not present

## 2022-03-20 DIAGNOSIS — I5030 Unspecified diastolic (congestive) heart failure: Secondary | ICD-10-CM | POA: Diagnosis not present

## 2022-03-20 DIAGNOSIS — R293 Abnormal posture: Secondary | ICD-10-CM | POA: Diagnosis not present

## 2022-03-21 DIAGNOSIS — R41841 Cognitive communication deficit: Secondary | ICD-10-CM | POA: Diagnosis not present

## 2022-03-21 DIAGNOSIS — R293 Abnormal posture: Secondary | ICD-10-CM | POA: Diagnosis not present

## 2022-03-21 DIAGNOSIS — S72142D Displaced intertrochanteric fracture of left femur, subsequent encounter for closed fracture with routine healing: Secondary | ICD-10-CM | POA: Diagnosis not present

## 2022-03-21 DIAGNOSIS — M6281 Muscle weakness (generalized): Secondary | ICD-10-CM | POA: Diagnosis not present

## 2022-03-21 DIAGNOSIS — I5030 Unspecified diastolic (congestive) heart failure: Secondary | ICD-10-CM | POA: Diagnosis not present

## 2022-03-22 DIAGNOSIS — S91101A Unspecified open wound of right great toe without damage to nail, initial encounter: Secondary | ICD-10-CM | POA: Diagnosis not present

## 2022-03-23 DIAGNOSIS — R41841 Cognitive communication deficit: Secondary | ICD-10-CM | POA: Diagnosis not present

## 2022-03-23 DIAGNOSIS — M6281 Muscle weakness (generalized): Secondary | ICD-10-CM | POA: Diagnosis not present

## 2022-03-23 DIAGNOSIS — S72142D Displaced intertrochanteric fracture of left femur, subsequent encounter for closed fracture with routine healing: Secondary | ICD-10-CM | POA: Diagnosis not present

## 2022-03-23 DIAGNOSIS — R293 Abnormal posture: Secondary | ICD-10-CM | POA: Diagnosis not present

## 2022-03-23 DIAGNOSIS — I5030 Unspecified diastolic (congestive) heart failure: Secondary | ICD-10-CM | POA: Diagnosis not present

## 2022-03-24 DIAGNOSIS — I5030 Unspecified diastolic (congestive) heart failure: Secondary | ICD-10-CM | POA: Diagnosis not present

## 2022-03-24 DIAGNOSIS — R41841 Cognitive communication deficit: Secondary | ICD-10-CM | POA: Diagnosis not present

## 2022-03-24 DIAGNOSIS — R293 Abnormal posture: Secondary | ICD-10-CM | POA: Diagnosis not present

## 2022-03-24 DIAGNOSIS — M6281 Muscle weakness (generalized): Secondary | ICD-10-CM | POA: Diagnosis not present

## 2022-03-24 DIAGNOSIS — S72142D Displaced intertrochanteric fracture of left femur, subsequent encounter for closed fracture with routine healing: Secondary | ICD-10-CM | POA: Diagnosis not present

## 2022-03-28 DIAGNOSIS — I5030 Unspecified diastolic (congestive) heart failure: Secondary | ICD-10-CM | POA: Diagnosis not present

## 2022-03-28 DIAGNOSIS — R293 Abnormal posture: Secondary | ICD-10-CM | POA: Diagnosis not present

## 2022-03-28 DIAGNOSIS — M6281 Muscle weakness (generalized): Secondary | ICD-10-CM | POA: Diagnosis not present

## 2022-03-28 DIAGNOSIS — S72142D Displaced intertrochanteric fracture of left femur, subsequent encounter for closed fracture with routine healing: Secondary | ICD-10-CM | POA: Diagnosis not present

## 2022-03-28 DIAGNOSIS — R41841 Cognitive communication deficit: Secondary | ICD-10-CM | POA: Diagnosis not present

## 2022-03-30 DIAGNOSIS — I5030 Unspecified diastolic (congestive) heart failure: Secondary | ICD-10-CM | POA: Diagnosis not present

## 2022-03-30 DIAGNOSIS — R41841 Cognitive communication deficit: Secondary | ICD-10-CM | POA: Diagnosis not present

## 2022-03-30 DIAGNOSIS — S72142D Displaced intertrochanteric fracture of left femur, subsequent encounter for closed fracture with routine healing: Secondary | ICD-10-CM | POA: Diagnosis not present

## 2022-03-30 DIAGNOSIS — M6281 Muscle weakness (generalized): Secondary | ICD-10-CM | POA: Diagnosis not present

## 2022-03-30 DIAGNOSIS — R293 Abnormal posture: Secondary | ICD-10-CM | POA: Diagnosis not present

## 2022-04-12 DIAGNOSIS — F5101 Primary insomnia: Secondary | ICD-10-CM | POA: Diagnosis not present

## 2022-04-12 DIAGNOSIS — F32A Depression, unspecified: Secondary | ICD-10-CM | POA: Diagnosis not present

## 2022-04-13 DIAGNOSIS — R4181 Age-related cognitive decline: Secondary | ICD-10-CM | POA: Diagnosis not present

## 2022-04-13 DIAGNOSIS — Z9181 History of falling: Secondary | ICD-10-CM | POA: Diagnosis not present

## 2022-04-13 DIAGNOSIS — I5030 Unspecified diastolic (congestive) heart failure: Secondary | ICD-10-CM | POA: Diagnosis not present

## 2022-04-13 DIAGNOSIS — M545 Low back pain, unspecified: Secondary | ICD-10-CM | POA: Diagnosis not present

## 2022-04-13 DIAGNOSIS — S72142D Displaced intertrochanteric fracture of left femur, subsequent encounter for closed fracture with routine healing: Secondary | ICD-10-CM | POA: Diagnosis not present

## 2022-04-13 DIAGNOSIS — M6281 Muscle weakness (generalized): Secondary | ICD-10-CM | POA: Diagnosis not present

## 2022-04-19 DIAGNOSIS — M545 Low back pain, unspecified: Secondary | ICD-10-CM | POA: Diagnosis not present

## 2022-04-19 DIAGNOSIS — S72142D Displaced intertrochanteric fracture of left femur, subsequent encounter for closed fracture with routine healing: Secondary | ICD-10-CM | POA: Diagnosis not present

## 2022-04-19 DIAGNOSIS — M6281 Muscle weakness (generalized): Secondary | ICD-10-CM | POA: Diagnosis not present

## 2022-04-19 DIAGNOSIS — I5030 Unspecified diastolic (congestive) heart failure: Secondary | ICD-10-CM | POA: Diagnosis not present

## 2022-04-19 DIAGNOSIS — Z9181 History of falling: Secondary | ICD-10-CM | POA: Diagnosis not present

## 2022-04-19 DIAGNOSIS — R4181 Age-related cognitive decline: Secondary | ICD-10-CM | POA: Diagnosis not present

## 2022-05-04 DIAGNOSIS — I5030 Unspecified diastolic (congestive) heart failure: Secondary | ICD-10-CM | POA: Diagnosis not present

## 2022-05-04 DIAGNOSIS — Z9181 History of falling: Secondary | ICD-10-CM | POA: Diagnosis not present

## 2022-05-04 DIAGNOSIS — S72142D Displaced intertrochanteric fracture of left femur, subsequent encounter for closed fracture with routine healing: Secondary | ICD-10-CM | POA: Diagnosis not present

## 2022-05-04 DIAGNOSIS — R4181 Age-related cognitive decline: Secondary | ICD-10-CM | POA: Diagnosis not present

## 2022-05-04 DIAGNOSIS — M6281 Muscle weakness (generalized): Secondary | ICD-10-CM | POA: Diagnosis not present

## 2022-05-04 DIAGNOSIS — R2681 Unsteadiness on feet: Secondary | ICD-10-CM | POA: Diagnosis not present

## 2022-05-04 DIAGNOSIS — M545 Low back pain, unspecified: Secondary | ICD-10-CM | POA: Diagnosis not present

## 2022-06-05 DIAGNOSIS — E039 Hypothyroidism, unspecified: Secondary | ICD-10-CM | POA: Diagnosis not present

## 2022-06-26 DIAGNOSIS — F32A Depression, unspecified: Secondary | ICD-10-CM | POA: Diagnosis not present

## 2022-06-26 DIAGNOSIS — F5101 Primary insomnia: Secondary | ICD-10-CM | POA: Diagnosis not present

## 2022-06-30 DIAGNOSIS — Z23 Encounter for immunization: Secondary | ICD-10-CM | POA: Diagnosis not present

## 2022-07-10 DIAGNOSIS — F32A Depression, unspecified: Secondary | ICD-10-CM | POA: Diagnosis not present

## 2022-07-10 DIAGNOSIS — F5101 Primary insomnia: Secondary | ICD-10-CM | POA: Diagnosis not present

## 2022-07-10 DIAGNOSIS — F411 Generalized anxiety disorder: Secondary | ICD-10-CM | POA: Diagnosis not present

## 2022-08-07 DIAGNOSIS — I5032 Chronic diastolic (congestive) heart failure: Secondary | ICD-10-CM | POA: Diagnosis not present

## 2022-08-07 DIAGNOSIS — I10 Essential (primary) hypertension: Secondary | ICD-10-CM | POA: Diagnosis not present

## 2022-08-11 DIAGNOSIS — Z23 Encounter for immunization: Secondary | ICD-10-CM | POA: Diagnosis not present

## 2022-08-12 DIAGNOSIS — R748 Abnormal levels of other serum enzymes: Secondary | ICD-10-CM | POA: Diagnosis not present

## 2022-08-22 DIAGNOSIS — R600 Localized edema: Secondary | ICD-10-CM | POA: Diagnosis not present

## 2022-08-22 DIAGNOSIS — I5032 Chronic diastolic (congestive) heart failure: Secondary | ICD-10-CM | POA: Diagnosis not present

## 2022-08-22 DIAGNOSIS — I872 Venous insufficiency (chronic) (peripheral): Secondary | ICD-10-CM | POA: Diagnosis not present

## 2022-08-22 DIAGNOSIS — M542 Cervicalgia: Secondary | ICD-10-CM | POA: Diagnosis not present

## 2022-08-22 DIAGNOSIS — N390 Urinary tract infection, site not specified: Secondary | ICD-10-CM | POA: Diagnosis not present

## 2022-08-26 DIAGNOSIS — I509 Heart failure, unspecified: Secondary | ICD-10-CM | POA: Diagnosis not present

## 2022-08-26 DIAGNOSIS — M542 Cervicalgia: Secondary | ICD-10-CM | POA: Diagnosis not present

## 2022-08-26 DIAGNOSIS — I872 Venous insufficiency (chronic) (peripheral): Secondary | ICD-10-CM | POA: Diagnosis not present

## 2022-08-26 DIAGNOSIS — R600 Localized edema: Secondary | ICD-10-CM | POA: Diagnosis not present

## 2022-09-04 DIAGNOSIS — Z79899 Other long term (current) drug therapy: Secondary | ICD-10-CM | POA: Diagnosis not present

## 2022-09-18 DIAGNOSIS — R531 Weakness: Secondary | ICD-10-CM | POA: Diagnosis not present

## 2022-10-09 DIAGNOSIS — F5101 Primary insomnia: Secondary | ICD-10-CM | POA: Diagnosis not present

## 2022-10-09 DIAGNOSIS — F32A Depression, unspecified: Secondary | ICD-10-CM | POA: Diagnosis not present

## 2022-10-09 DIAGNOSIS — F411 Generalized anxiety disorder: Secondary | ICD-10-CM | POA: Diagnosis not present

## 2022-10-17 ENCOUNTER — Emergency Department (HOSPITAL_COMMUNITY): Payer: Medicare Other

## 2022-10-17 ENCOUNTER — Emergency Department (HOSPITAL_COMMUNITY)
Admission: EM | Admit: 2022-10-17 | Discharge: 2022-10-17 | Disposition: A | Discharge status: Skilled Nursing Facility | Payer: Medicare Other | Attending: Emergency Medicine | Admitting: Emergency Medicine

## 2022-10-17 ENCOUNTER — Other Ambulatory Visit: Payer: Self-pay

## 2022-10-17 DIAGNOSIS — M25561 Pain in right knee: Secondary | ICD-10-CM | POA: Insufficient documentation

## 2022-10-17 DIAGNOSIS — Y92129 Unspecified place in nursing home as the place of occurrence of the external cause: Secondary | ICD-10-CM | POA: Insufficient documentation

## 2022-10-17 DIAGNOSIS — M79671 Pain in right foot: Secondary | ICD-10-CM | POA: Insufficient documentation

## 2022-10-17 DIAGNOSIS — S72442A Displaced fracture of lower epiphysis (separation) of left femur, initial encounter for closed fracture: Secondary | ICD-10-CM | POA: Insufficient documentation

## 2022-10-17 DIAGNOSIS — Z043 Encounter for examination and observation following other accident: Secondary | ICD-10-CM | POA: Diagnosis not present

## 2022-10-17 DIAGNOSIS — M25562 Pain in left knee: Secondary | ICD-10-CM | POA: Diagnosis not present

## 2022-10-17 DIAGNOSIS — G8911 Acute pain due to trauma: Secondary | ICD-10-CM | POA: Diagnosis not present

## 2022-10-17 DIAGNOSIS — Z7401 Bed confinement status: Secondary | ICD-10-CM | POA: Diagnosis not present

## 2022-10-17 DIAGNOSIS — M79672 Pain in left foot: Secondary | ICD-10-CM | POA: Insufficient documentation

## 2022-10-17 DIAGNOSIS — S72402A Unspecified fracture of lower end of left femur, initial encounter for closed fracture: Secondary | ICD-10-CM | POA: Diagnosis not present

## 2022-10-17 DIAGNOSIS — S99922A Unspecified injury of left foot, initial encounter: Secondary | ICD-10-CM | POA: Diagnosis not present

## 2022-10-17 DIAGNOSIS — S79922A Unspecified injury of left thigh, initial encounter: Secondary | ICD-10-CM | POA: Diagnosis present

## 2022-10-17 DIAGNOSIS — M7989 Other specified soft tissue disorders: Secondary | ICD-10-CM | POA: Diagnosis not present

## 2022-10-17 DIAGNOSIS — W19XXXA Unspecified fall, initial encounter: Secondary | ICD-10-CM | POA: Insufficient documentation

## 2022-10-17 DIAGNOSIS — S72443A Displaced fracture of lower epiphysis (separation) of unspecified femur, initial encounter for closed fracture: Secondary | ICD-10-CM

## 2022-10-17 DIAGNOSIS — R531 Weakness: Secondary | ICD-10-CM | POA: Diagnosis not present

## 2022-10-17 MED ORDER — OXYCODONE-ACETAMINOPHEN 5-325 MG PO TABS: 1.0000 | ORAL_TABLET | Freq: Four times a day (QID) | ORAL | 0 refills | Status: DC | PRN

## 2022-10-17 MED ORDER — OXYCODONE-ACETAMINOPHEN 5-325 MG PO TABS
1.0000 | ORAL_TABLET | Freq: Once | ORAL | Status: AC
  Administered 2022-10-17: 1 via ORAL
  Filled 2022-10-17: qty 1

## 2022-10-17 NOTE — ED Notes (Addendum)
Pt's daughter, Katharine Look, is requesting to be called when pt is discharged and transport has been arranged. [phone]  Daughter also advises that the pt is in pain and she is requesting pt receive pain medication. Dr.Allen notified.

## 2022-10-17 NOTE — ED Provider Notes (Signed)
[facility] DEPT Provider Note   CSN: [id number] Arrival date & time: 10/17/22  1736     History  Chief Complaint  Patient presents with   Jamirra Curnow is a 89 y.o. female.  89 year old female who presents after a chemical fall from nursing home.  Patient was standing in fell onto her knees.  No head or neck trauma.  Patient states that she does not ambulate normally.  She is bedbound.  Complains of bilateral knee discomfort characterized as dull and worse with movement.  Also notes bilateral foot pain.  Denies any low back pain.  EMS was called and patient transported here       Home Medications Prior to Admission medications   Medication Sig Start Date End Date Taking? Authorizing Provider  acetaminophen (TYLENOL) 500 MG tablet Take 1 tablet (500 mg total) by mouth every 6 (six) hours as needed for mild pain or headache. 07/11/19   Ivor Costa, MD  aspirin EC 81 MG tablet Take 81 mg by mouth every morning.     [provider]  diphenhydrAMINE (BENADRYL) 25 MG tablet Take 25 mg by mouth every 6 (six) hours as needed for allergies.    [provider]  enoxaparin (LOVENOX) 40 MG/0.4ML injection Inject 0.4 mLs (40 mg total) into the skin daily for 30 doses. For 30 days post op for DVT prophylaxis 07/06/19 08/05/19  Prudencio Burly III, PA-C  metoprolol succinate (TOPROL XL) 25 MG 24 hr tablet Take 0.5 tablets (12.5 mg total) by mouth daily. 08/01/19   Fay Records, MD  mirtazapine (REMERON) 7.5 MG tablet Take 1 tablet (7.5 mg total) by mouth at bedtime as needed (insomnia). 07/11/19   Ivor Costa, MD  Multiple Vitamin (MULTIVITAMIN WITH MINERALS) TABS tablet Take 1 tablet by mouth every morning.     [provider]  nitroGLYCERIN (NITROSTAT) 0.4 MG SL tablet Place 0.4 mg under the tongue every 5 (five) minutes as needed for chest pain (3 doses max).     [provider]  ondansetron (ZOFRAN) 4 MG tablet  Take 1 tablet (4 mg total) by mouth every 6 (six) hours as needed for nausea. 07/11/19   Ivor Costa, MD  polyethylene glycol (MIRALAX) 17 g packet Take 17 g by mouth daily. 07/11/19   Ivor Costa, MD      Allergies    Penicillins and Shrimp [shellfish allergy]    Review of Systems   Review of Systems  All other systems reviewed and are negative.   Physical Exam Updated Vital Signs BP (!) 135/100   Pulse 75   Temp 97.7 F (36.5 C) (Oral)   Resp 18   Wt 80 kg   SpO2 95%   BMI 32.26 kg/m  Physical Exam Vitals and nursing note reviewed.  Constitutional:      General: She is not in acute distress.    Appearance: Normal appearance. She is well-developed. She is not toxic-appearing.  HENT:     Head: Normocephalic and atraumatic.  Eyes:     General: Lids are normal.     Conjunctiva/sclera: Conjunctivae normal.     Pupils: Pupils are equal, round, and reactive to light.  Neck:     Thyroid: No thyroid mass.     Trachea: No tracheal deviation.  Cardiovascular:     Rate and Rhythm: Normal rate and regular rhythm.     Heart sounds: Normal heart sounds. No murmur heard.    No  gallop.  Pulmonary:     Effort: Pulmonary effort is normal. No respiratory distress.     Breath sounds: Normal breath sounds. No stridor. No decreased breath sounds, wheezing, rhonchi or rales.  Abdominal:     General: There is no distension.     Palpations: Abdomen is soft.     Tenderness: There is no abdominal tenderness. There is no rebound.  Musculoskeletal:        General: No tenderness. Normal range of motion.     Cervical back: Normal range of motion and neck supple.     Comments: Bilateral knees without evidence of deformity.  No ecchymosis noted.  Bilateral feet without signs of trauma.  They are tender to palpation along with her knees.  No pain with rotation of patient's lower extremities.  Skin:    General: Skin is warm and dry.     Findings: No abrasion or rash.  Neurological:     Mental  Status: She is alert and oriented to person, place, and time. Mental status is at baseline.     GCS: GCS eye subscore is 4. GCS verbal subscore is 5. GCS motor subscore is 6.     Cranial Nerves: Cranial nerves 2-12 are intact. No cranial nerve deficit.     Sensory: No sensory deficit.     Motor: No tremor or atrophy.  Psychiatric:        Attention and Perception: Attention normal.     ED Results / Procedures / Treatments   Labs (all labs ordered are listed, but only abnormal results are displayed) Labs Reviewed - No data to display  EKG None  Radiology No results found.  Procedures Procedures    Medications Ordered in ED Medications - No data to display  ED Course/ Medical Decision Making/ A&P                             Medical Decision Making Amount and/or Complexity of Data Reviewed Radiology: ordered.  Risk Prescription drug management.  Patient is x-rays system of bilateral knee fractures.  Had a long discussion with patient's son who is power of attorney and given patient's overall condition would not proceed with any operative intervention at this time.  Discussed orthopedics on-call and patient will be placed in bilateral knee immobilizers.  Send spoke with facility and they can take the patient back with this.  Patient medicated for pain at this time.  Will prescribe pain medication and will send patient home         Final Clinical Impression(s) / ED Diagnoses Final diagnoses:  None    Rx / DC Orders ED Discharge Orders     None         Lacretia Leigh, MD 10/17/22 2125

## 2022-10-17 NOTE — ED Triage Notes (Signed)
BIBA from [facility] from fall from standing position to right knee 30 min PTA>  Patient complaining of pain to right knee radiating to toes.  Ems reports external rotation to right leg. A&O x4 Denies hitting head and denies BT usage.  2lpm [place] baseline

## 2022-10-17 NOTE — Discharge Instructions (Signed)
Follow-up with your orthopedist in 1 week

## 2022-10-18 DIAGNOSIS — I5032 Chronic diastolic (congestive) heart failure: Secondary | ICD-10-CM | POA: Diagnosis not present

## 2022-10-18 DIAGNOSIS — J811 Chronic pulmonary edema: Secondary | ICD-10-CM | POA: Diagnosis not present

## 2022-10-19 DIAGNOSIS — J9601 Acute respiratory failure with hypoxia: Secondary | ICD-10-CM | POA: Diagnosis not present

## 2022-10-19 DIAGNOSIS — R918 Other nonspecific abnormal finding of lung field: Secondary | ICD-10-CM | POA: Diagnosis not present

## 2022-10-20 DIAGNOSIS — R279 Unspecified lack of coordination: Secondary | ICD-10-CM | POA: Diagnosis not present

## 2022-10-20 DIAGNOSIS — Z7401 Bed confinement status: Secondary | ICD-10-CM | POA: Diagnosis not present

## 2022-10-20 DIAGNOSIS — I5030 Unspecified diastolic (congestive) heart failure: Secondary | ICD-10-CM | POA: Diagnosis not present

## 2022-10-20 DIAGNOSIS — M6281 Muscle weakness (generalized): Secondary | ICD-10-CM | POA: Diagnosis not present

## 2022-10-20 DIAGNOSIS — Z4789 Encounter for other orthopedic aftercare: Secondary | ICD-10-CM | POA: Diagnosis not present

## 2022-10-20 DIAGNOSIS — R293 Abnormal posture: Secondary | ICD-10-CM | POA: Diagnosis not present

## 2022-10-20 DIAGNOSIS — R231 Pallor: Secondary | ICD-10-CM | POA: Diagnosis not present

## 2022-10-20 DIAGNOSIS — R41841 Cognitive communication deficit: Secondary | ICD-10-CM | POA: Diagnosis not present

## 2022-10-20 DIAGNOSIS — S72142D Displaced intertrochanteric fracture of left femur, subsequent encounter for closed fracture with routine healing: Secondary | ICD-10-CM | POA: Diagnosis not present

## 2022-10-20 DIAGNOSIS — R1312 Dysphagia, oropharyngeal phase: Secondary | ICD-10-CM | POA: Diagnosis not present

## 2022-10-20 DIAGNOSIS — S72401D Unspecified fracture of lower end of right femur, subsequent encounter for closed fracture with routine healing: Secondary | ICD-10-CM | POA: Diagnosis not present

## 2022-10-20 DIAGNOSIS — Z9181 History of falling: Secondary | ICD-10-CM | POA: Diagnosis not present

## 2022-10-21 DIAGNOSIS — I10 Essential (primary) hypertension: Secondary | ICD-10-CM | POA: Diagnosis not present

## 2022-10-23 DIAGNOSIS — R293 Abnormal posture: Secondary | ICD-10-CM | POA: Diagnosis not present

## 2022-10-23 DIAGNOSIS — M6281 Muscle weakness (generalized): Secondary | ICD-10-CM | POA: Diagnosis not present

## 2022-10-23 DIAGNOSIS — R279 Unspecified lack of coordination: Secondary | ICD-10-CM | POA: Diagnosis not present

## 2022-10-23 DIAGNOSIS — Z9181 History of falling: Secondary | ICD-10-CM | POA: Diagnosis not present

## 2022-10-23 DIAGNOSIS — S72401D Unspecified fracture of lower end of right femur, subsequent encounter for closed fracture with routine healing: Secondary | ICD-10-CM | POA: Diagnosis not present

## 2022-10-23 DIAGNOSIS — R1312 Dysphagia, oropharyngeal phase: Secondary | ICD-10-CM | POA: Diagnosis not present

## 2022-10-24 DIAGNOSIS — Z9181 History of falling: Secondary | ICD-10-CM | POA: Diagnosis not present

## 2022-10-24 DIAGNOSIS — S72401D Unspecified fracture of lower end of right femur, subsequent encounter for closed fracture with routine healing: Secondary | ICD-10-CM | POA: Diagnosis not present

## 2022-10-24 DIAGNOSIS — Z79899 Other long term (current) drug therapy: Secondary | ICD-10-CM | POA: Diagnosis not present

## 2022-10-24 DIAGNOSIS — R1312 Dysphagia, oropharyngeal phase: Secondary | ICD-10-CM | POA: Diagnosis not present

## 2022-10-24 DIAGNOSIS — M6281 Muscle weakness (generalized): Secondary | ICD-10-CM | POA: Diagnosis not present

## 2022-10-24 DIAGNOSIS — R279 Unspecified lack of coordination: Secondary | ICD-10-CM | POA: Diagnosis not present

## 2022-10-24 DIAGNOSIS — R293 Abnormal posture: Secondary | ICD-10-CM | POA: Diagnosis not present

## 2022-10-25 DIAGNOSIS — Z9181 History of falling: Secondary | ICD-10-CM | POA: Diagnosis not present

## 2022-10-25 DIAGNOSIS — R279 Unspecified lack of coordination: Secondary | ICD-10-CM | POA: Diagnosis not present

## 2022-10-25 DIAGNOSIS — R1312 Dysphagia, oropharyngeal phase: Secondary | ICD-10-CM | POA: Diagnosis not present

## 2022-10-25 DIAGNOSIS — S72401D Unspecified fracture of lower end of right femur, subsequent encounter for closed fracture with routine healing: Secondary | ICD-10-CM | POA: Diagnosis not present

## 2022-10-25 DIAGNOSIS — M6281 Muscle weakness (generalized): Secondary | ICD-10-CM | POA: Diagnosis not present

## 2022-10-25 DIAGNOSIS — Z79899 Other long term (current) drug therapy: Secondary | ICD-10-CM | POA: Diagnosis not present

## 2022-10-25 DIAGNOSIS — R293 Abnormal posture: Secondary | ICD-10-CM | POA: Diagnosis not present

## 2022-10-26 DIAGNOSIS — Z9181 History of falling: Secondary | ICD-10-CM | POA: Diagnosis not present

## 2022-10-26 DIAGNOSIS — R1312 Dysphagia, oropharyngeal phase: Secondary | ICD-10-CM | POA: Diagnosis not present

## 2022-10-26 DIAGNOSIS — M6281 Muscle weakness (generalized): Secondary | ICD-10-CM | POA: Diagnosis not present

## 2022-10-26 DIAGNOSIS — R279 Unspecified lack of coordination: Secondary | ICD-10-CM | POA: Diagnosis not present

## 2022-10-26 DIAGNOSIS — S72401D Unspecified fracture of lower end of right femur, subsequent encounter for closed fracture with routine healing: Secondary | ICD-10-CM | POA: Diagnosis not present

## 2022-10-26 DIAGNOSIS — R293 Abnormal posture: Secondary | ICD-10-CM | POA: Diagnosis not present

## 2022-10-30 DIAGNOSIS — Z79899 Other long term (current) drug therapy: Secondary | ICD-10-CM | POA: Diagnosis not present

## 2022-10-31 DIAGNOSIS — M6281 Muscle weakness (generalized): Secondary | ICD-10-CM | POA: Diagnosis not present

## 2022-10-31 DIAGNOSIS — R279 Unspecified lack of coordination: Secondary | ICD-10-CM | POA: Diagnosis not present

## 2022-10-31 DIAGNOSIS — Z9181 History of falling: Secondary | ICD-10-CM | POA: Diagnosis not present

## 2022-10-31 DIAGNOSIS — S72401D Unspecified fracture of lower end of right femur, subsequent encounter for closed fracture with routine healing: Secondary | ICD-10-CM | POA: Diagnosis not present

## 2022-10-31 DIAGNOSIS — R293 Abnormal posture: Secondary | ICD-10-CM | POA: Diagnosis not present

## 2022-10-31 DIAGNOSIS — R1312 Dysphagia, oropharyngeal phase: Secondary | ICD-10-CM | POA: Diagnosis not present

## 2022-11-01 DIAGNOSIS — R1312 Dysphagia, oropharyngeal phase: Secondary | ICD-10-CM | POA: Diagnosis not present

## 2022-11-01 DIAGNOSIS — M25562 Pain in left knee: Secondary | ICD-10-CM | POA: Diagnosis not present

## 2022-11-01 DIAGNOSIS — Z9181 History of falling: Secondary | ICD-10-CM | POA: Diagnosis not present

## 2022-11-01 DIAGNOSIS — J159 Unspecified bacterial pneumonia: Secondary | ICD-10-CM | POA: Diagnosis not present

## 2022-11-01 DIAGNOSIS — S72401D Unspecified fracture of lower end of right femur, subsequent encounter for closed fracture with routine healing: Secondary | ICD-10-CM | POA: Diagnosis not present

## 2022-11-01 DIAGNOSIS — M25561 Pain in right knee: Secondary | ICD-10-CM | POA: Diagnosis not present

## 2022-11-01 DIAGNOSIS — R279 Unspecified lack of coordination: Secondary | ICD-10-CM | POA: Diagnosis not present

## 2022-11-01 DIAGNOSIS — M6281 Muscle weakness (generalized): Secondary | ICD-10-CM | POA: Diagnosis not present

## 2022-11-01 DIAGNOSIS — R293 Abnormal posture: Secondary | ICD-10-CM | POA: Diagnosis not present

## 2022-11-02 DIAGNOSIS — I5030 Unspecified diastolic (congestive) heart failure: Secondary | ICD-10-CM | POA: Diagnosis not present

## 2022-11-02 DIAGNOSIS — R1312 Dysphagia, oropharyngeal phase: Secondary | ICD-10-CM | POA: Diagnosis not present

## 2022-11-03 DIAGNOSIS — I5030 Unspecified diastolic (congestive) heart failure: Secondary | ICD-10-CM | POA: Diagnosis not present

## 2022-11-03 DIAGNOSIS — R1312 Dysphagia, oropharyngeal phase: Secondary | ICD-10-CM | POA: Diagnosis not present

## 2022-11-04 DIAGNOSIS — R059 Cough, unspecified: Secondary | ICD-10-CM | POA: Diagnosis not present

## 2022-11-04 DIAGNOSIS — Z79899 Other long term (current) drug therapy: Secondary | ICD-10-CM | POA: Diagnosis not present

## 2022-11-06 DIAGNOSIS — Z79899 Other long term (current) drug therapy: Secondary | ICD-10-CM | POA: Diagnosis not present

## 2022-11-06 DIAGNOSIS — M25562 Pain in left knee: Secondary | ICD-10-CM | POA: Diagnosis not present

## 2022-11-06 DIAGNOSIS — J159 Unspecified bacterial pneumonia: Secondary | ICD-10-CM | POA: Diagnosis not present

## 2022-11-06 DIAGNOSIS — M25561 Pain in right knee: Secondary | ICD-10-CM | POA: Diagnosis not present

## 2022-11-06 DIAGNOSIS — I5030 Unspecified diastolic (congestive) heart failure: Secondary | ICD-10-CM | POA: Diagnosis not present

## 2022-11-06 DIAGNOSIS — R627 Adult failure to thrive: Secondary | ICD-10-CM | POA: Diagnosis not present

## 2022-11-06 DIAGNOSIS — R1312 Dysphagia, oropharyngeal phase: Secondary | ICD-10-CM | POA: Diagnosis not present

## 2022-11-08 DIAGNOSIS — R1312 Dysphagia, oropharyngeal phase: Secondary | ICD-10-CM | POA: Diagnosis not present

## 2022-11-08 DIAGNOSIS — I5030 Unspecified diastolic (congestive) heart failure: Secondary | ICD-10-CM | POA: Diagnosis not present

## 2022-11-09 DIAGNOSIS — I5030 Unspecified diastolic (congestive) heart failure: Secondary | ICD-10-CM | POA: Diagnosis not present

## 2022-11-09 DIAGNOSIS — R1312 Dysphagia, oropharyngeal phase: Secondary | ICD-10-CM | POA: Diagnosis not present

## 2022-11-10 DIAGNOSIS — Z79899 Other long term (current) drug therapy: Secondary | ICD-10-CM | POA: Diagnosis not present

## 2022-11-14 DIAGNOSIS — M25561 Pain in right knee: Secondary | ICD-10-CM | POA: Diagnosis not present

## 2022-11-14 DIAGNOSIS — R079 Chest pain, unspecified: Secondary | ICD-10-CM | POA: Diagnosis not present

## 2022-11-14 DIAGNOSIS — R059 Cough, unspecified: Secondary | ICD-10-CM | POA: Diagnosis not present

## 2023-06-08 ENCOUNTER — Encounter (HOSPITAL_COMMUNITY): Payer: Self-pay

## 2023-06-08 ENCOUNTER — Emergency Department (HOSPITAL_COMMUNITY): Payer: Medicare Other

## 2023-06-08 ENCOUNTER — Inpatient Hospital Stay (HOSPITAL_COMMUNITY)
Admission: EM | Admit: 2023-06-08 | Discharge: 2023-06-11 | DRG: 871 | Disposition: A | Discharge status: Skilled Nursing Facility | Payer: Medicare Other | Source: Skilled Nursing Facility | Attending: Obstetrics and Gynecology | Admitting: Obstetrics and Gynecology

## 2023-06-08 ENCOUNTER — Other Ambulatory Visit: Payer: Self-pay

## 2023-06-08 DIAGNOSIS — G9341 Metabolic encephalopathy: Secondary | ICD-10-CM | POA: Diagnosis not present

## 2023-06-08 DIAGNOSIS — N390 Urinary tract infection, site not specified: Secondary | ICD-10-CM | POA: Diagnosis present

## 2023-06-08 DIAGNOSIS — E785 Hyperlipidemia, unspecified: Secondary | ICD-10-CM | POA: Diagnosis present

## 2023-06-08 DIAGNOSIS — E872 Acidosis, unspecified: Secondary | ICD-10-CM | POA: Diagnosis present

## 2023-06-08 DIAGNOSIS — N179 Acute kidney failure, unspecified: Secondary | ICD-10-CM | POA: Diagnosis present

## 2023-06-08 DIAGNOSIS — Z91013 Allergy to seafood: Secondary | ICD-10-CM

## 2023-06-08 DIAGNOSIS — R7881 Bacteremia: Secondary | ICD-10-CM | POA: Insufficient documentation

## 2023-06-08 DIAGNOSIS — Z515 Encounter for palliative care: Secondary | ICD-10-CM

## 2023-06-08 DIAGNOSIS — R6520 Severe sepsis without septic shock: Secondary | ICD-10-CM | POA: Diagnosis present

## 2023-06-08 DIAGNOSIS — R6521 Severe sepsis with septic shock: Secondary | ICD-10-CM | POA: Diagnosis not present

## 2023-06-08 DIAGNOSIS — K828 Other specified diseases of gallbladder: Secondary | ICD-10-CM | POA: Diagnosis present

## 2023-06-08 DIAGNOSIS — Z79899 Other long term (current) drug therapy: Secondary | ICD-10-CM

## 2023-06-08 DIAGNOSIS — B961 Klebsiella pneumoniae [K. pneumoniae] as the cause of diseases classified elsewhere: Secondary | ICD-10-CM | POA: Diagnosis present

## 2023-06-08 DIAGNOSIS — Z1152 Encounter for screening for COVID-19: Secondary | ICD-10-CM

## 2023-06-08 DIAGNOSIS — A419 Sepsis, unspecified organism: Principal | ICD-10-CM | POA: Diagnosis present

## 2023-06-08 DIAGNOSIS — J9601 Acute respiratory failure with hypoxia: Secondary | ICD-10-CM | POA: Diagnosis present

## 2023-06-08 DIAGNOSIS — R7989 Other specified abnormal findings of blood chemistry: Secondary | ICD-10-CM | POA: Diagnosis not present

## 2023-06-08 DIAGNOSIS — Z880 Allergy status to penicillin: Secondary | ICD-10-CM

## 2023-06-08 DIAGNOSIS — Z7982 Long term (current) use of aspirin: Secondary | ICD-10-CM

## 2023-06-08 DIAGNOSIS — I5032 Chronic diastolic (congestive) heart failure: Secondary | ICD-10-CM | POA: Diagnosis present

## 2023-06-08 DIAGNOSIS — Z66 Do not resuscitate: Secondary | ICD-10-CM | POA: Diagnosis present

## 2023-06-08 DIAGNOSIS — G4700 Insomnia, unspecified: Secondary | ICD-10-CM | POA: Diagnosis present

## 2023-06-08 DIAGNOSIS — I110 Hypertensive heart disease with heart failure: Secondary | ICD-10-CM | POA: Diagnosis present

## 2023-06-08 LAB — I-STAT CG4 LACTIC ACID, ED: Lactic Acid, Venous: 3.9 mmol/L (ref 0.5–1.9)

## 2023-06-08 LAB — RESP PANEL BY RT-PCR (RSV, FLU A&B, COVID)  RVPGX2
Influenza A by PCR: NEGATIVE
Influenza B by PCR: NEGATIVE
Resp Syncytial Virus by PCR: NEGATIVE
SARS Coronavirus 2 by RT PCR: NEGATIVE

## 2023-06-08 LAB — BLOOD CULTURE ID PANEL (REFLEXED) - BCID2
A.calcoaceticus-baumannii: NOT DETECTED
Bacteroides fragilis: NOT DETECTED
CTX-M ESBL: NOT DETECTED
Candida albicans: NOT DETECTED
Candida auris: NOT DETECTED
Candida glabrata: NOT DETECTED
Candida krusei: NOT DETECTED
Candida parapsilosis: NOT DETECTED
Candida tropicalis: NOT DETECTED
Carbapenem resist OXA 48 LIKE: NOT DETECTED
Carbapenem resistance IMP: NOT DETECTED
Carbapenem resistance KPC: NOT DETECTED
Carbapenem resistance NDM: NOT DETECTED
Carbapenem resistance VIM: NOT DETECTED
Cryptococcus neoformans/gattii: NOT DETECTED
Enterobacter cloacae complex: NOT DETECTED
Enterobacterales: DETECTED — AB
Enterococcus Faecium: NOT DETECTED
Enterococcus faecalis: NOT DETECTED
Escherichia coli: NOT DETECTED
Haemophilus influenzae: NOT DETECTED
Klebsiella aerogenes: NOT DETECTED
Klebsiella oxytoca: NOT DETECTED
Klebsiella pneumoniae: DETECTED — AB
Listeria monocytogenes: NOT DETECTED
Neisseria meningitidis: NOT DETECTED
Proteus species: NOT DETECTED
Pseudomonas aeruginosa: NOT DETECTED
Salmonella species: NOT DETECTED
Serratia marcescens: NOT DETECTED
Staphylococcus aureus (BCID): NOT DETECTED
Staphylococcus epidermidis: NOT DETECTED
Staphylococcus lugdunensis: NOT DETECTED
Staphylococcus species: DETECTED — AB
Stenotrophomonas maltophilia: NOT DETECTED
Streptococcus agalactiae: NOT DETECTED
Streptococcus pneumoniae: NOT DETECTED
Streptococcus pyogenes: NOT DETECTED
Streptococcus species: NOT DETECTED

## 2023-06-08 LAB — COMPREHENSIVE METABOLIC PANEL
ALT: 115 U/L — ABNORMAL HIGH (ref 0–44)
AST: 506 U/L — ABNORMAL HIGH (ref 15–41)
Albumin: <1.5 g/dL — ABNORMAL LOW (ref 3.5–5.0)
Alkaline Phosphatase: 410 U/L — ABNORMAL HIGH (ref 38–126)
Anion gap: 13 (ref 5–15)
BUN: 16 mg/dL (ref 8–23)
CO2: 24 mmol/L (ref 22–32)
Calcium: 8.3 mg/dL — ABNORMAL LOW (ref 8.9–10.3)
Chloride: 101 mmol/L (ref 98–111)
Creatinine, Ser: 1.53 mg/dL — ABNORMAL HIGH (ref 0.44–1.00)
GFR, Estimated: 32 mL/min — ABNORMAL LOW (ref >60)
Glucose, Bld: 86 mg/dL (ref 70–99)
Potassium: 3.5 mmol/L (ref 3.5–5.1)
Sodium: 138 mmol/L (ref 135–145)
Total Bilirubin: 3.6 mg/dL — ABNORMAL HIGH (ref 0.3–1.2)
Total Protein: 6.1 g/dL — ABNORMAL LOW (ref 6.5–8.1)

## 2023-06-08 LAB — PROTIME-INR
INR: 1.2 (ref 0.8–1.2)
Prothrombin Time: 15.4 s — ABNORMAL HIGH (ref 11.4–15.2)

## 2023-06-08 LAB — CBC WITH DIFFERENTIAL/PLATELET
Abs Immature Granulocytes: 0.61 10*3/uL — ABNORMAL HIGH (ref 0.00–0.07)
Basophils Absolute: 0.1 10*3/uL (ref 0.0–0.1)
Basophils Relative: 0 %
Eosinophils Absolute: 0.1 10*3/uL (ref 0.0–0.5)
Eosinophils Relative: 1 %
HCT: 41.2 % (ref 36.0–46.0)
Hemoglobin: 13.3 g/dL (ref 12.0–15.0)
Immature Granulocytes: 2 %
Lymphocytes Relative: 3 %
Lymphs Abs: 0.8 10*3/uL (ref 0.7–4.0)
MCH: 33.4 pg (ref 26.0–34.0)
MCHC: 32.3 g/dL (ref 30.0–36.0)
MCV: 103.5 fL — ABNORMAL HIGH (ref 80.0–100.0)
Monocytes Absolute: 0.9 10*3/uL (ref 0.1–1.0)
Monocytes Relative: 3 %
Neutro Abs: 24.6 10*3/uL — ABNORMAL HIGH (ref 1.7–7.7)
Neutrophils Relative %: 91 %
Platelets: 312 10*3/uL (ref 150–400)
RBC: 3.98 MIL/uL (ref 3.87–5.11)
RDW: 17.3 % — ABNORMAL HIGH (ref 11.5–15.5)
WBC: 27.1 10*3/uL — ABNORMAL HIGH (ref 4.0–10.5)
nRBC: 0.1 % (ref 0.0–0.2)

## 2023-06-08 LAB — APTT: aPTT: 29 s (ref 24–36)

## 2023-06-08 MED ORDER — SODIUM CHLORIDE 0.9 % IV SOLN: INTRAVENOUS | Status: DC

## 2023-06-08 MED ORDER — AZTREONAM 1 G IM: 1.0000 g | Freq: Once | INTRAMUSCULAR | Status: DC

## 2023-06-08 MED ORDER — SODIUM CHLORIDE 0.9 % IV BOLUS
500.0000 mL | Freq: Once | INTRAVENOUS | Status: AC
  Administered 2023-06-08: 500 mL via INTRAVENOUS

## 2023-06-08 MED ORDER — VANCOMYCIN HCL 1500 MG/300ML IV SOLN
1500.0000 mg | Freq: Once | INTRAVENOUS | Status: AC
  Administered 2023-06-08: 1500 mg via INTRAVENOUS
  Filled 2023-06-08: qty 300

## 2023-06-08 MED ORDER — LORAZEPAM 2 MG/ML IJ SOLN: 2.0000 mg | INTRAMUSCULAR | Status: DC | PRN

## 2023-06-08 MED ORDER — SODIUM CHLORIDE 0.9 % IV SOLN
2.0000 g | Freq: Once | INTRAVENOUS | Status: AC
  Administered 2023-06-08: 2 g via INTRAVENOUS
  Filled 2023-06-08: qty 20

## 2023-06-08 MED ORDER — SODIUM CHLORIDE 0.9 % IV BOLUS
1000.0000 mL | Freq: Once | INTRAVENOUS | Status: AC
  Administered 2023-06-08: 1000 mL via INTRAVENOUS

## 2023-06-08 MED ORDER — GLYCOPYRROLATE 1 MG PO TABS: 1.0000 mg | ORAL_TABLET | ORAL | Status: DC | PRN

## 2023-06-08 MED ORDER — MORPHINE SULFATE (PF) 2 MG/ML IV SOLN
2.0000 mg | INTRAVENOUS | Status: DC | PRN
  Administered 2023-06-08 – 2023-06-11 (×5): 2 mg via INTRAVENOUS
  Filled 2023-06-08: qty 2
  Filled 2023-06-08 (×6): qty 1

## 2023-06-08 MED ORDER — POLYVINYL ALCOHOL 1.4 % OP SOLN: 1.0000 [drp] | Freq: Four times a day (QID) | OPHTHALMIC | Status: DC | PRN

## 2023-06-08 MED ORDER — ACETAMINOPHEN 650 MG RE SUPP: 650.0000 mg | Freq: Four times a day (QID) | RECTAL | Status: DC | PRN

## 2023-06-08 MED ORDER — TORSEMIDE 20 MG PO TABS: 40.0000 mg | ORAL_TABLET | Freq: Every day | ORAL | Status: DC

## 2023-06-08 MED ORDER — ACETAMINOPHEN 325 MG PO TABS: 650.0000 mg | ORAL_TABLET | Freq: Four times a day (QID) | ORAL | Status: DC | PRN

## 2023-06-08 MED ORDER — GLYCOPYRROLATE 0.2 MG/ML IJ SOLN: 0.2000 mg | INTRAMUSCULAR | Status: DC | PRN

## 2023-06-08 MED ORDER — QUETIAPINE FUMARATE 25 MG PO TABS
25.0000 mg | ORAL_TABLET | Freq: Every day | ORAL | Status: DC
  Filled 2023-06-08: qty 1

## 2023-06-08 NOTE — H&P (Signed)
History and Physical    Lauren Moreno NGE:[id number] DOB: Mar 11, 1928 DOA: 06/08/2023  PCP: Oneita Hurt, No   Chief Complaint: ams  HPI: Lauren Moreno is a 89 y.o. female with medical history significant of hypertension, hyperlipidemia who presented to the emergency department with altered mental status.  Patient lives in a nursing facility with him was found to have hematemesis and worsening oxygenation.  She was altered the day prior with increasing respiratory requirement.  Overnight her status continued to worsen so she was brought to the ER for further assessment.  On arrival she was hypotensive with systolics in the 50s.  Labs were obtained showed creatinine 1.53, AST 506, ALT 115, bilirubin 3.6, WBC 27.1, INR 1.2, lactic acid 3.9.  Patient underwent CT abdomen/pelvis which showed distended gallbladder.  CT head showed no acute intracranial maladies.  Chest x-ray showed airspace opacities.  Patient was admitted for further workup.  Patient's family has not been on his phone does not wish to pursue pressors or aggressive measures and rather pursue comfort care.  Patient was transition to comfort care and comfort medications were administered.  Review of Systems: Review of Systems  Constitutional: Negative.   HENT: Negative.    Eyes: Negative.   Respiratory: Negative.    Cardiovascular: Negative.   Gastrointestinal: Negative.   Genitourinary:  Positive for dysuria and urgency.  Musculoskeletal: Negative.   Skin: Negative.   Neurological:  Positive for loss of consciousness.  All other systems reviewed and are negative.    As per HPI otherwise 10 point review of systems negative.   Allergies  Allergen Reactions   Penicillins Anaphylaxis    Did it involve swelling of the face/tongue/throat, SOB, or low BP? Yes Did it involve sudden or severe rash/hives, skin peeling, or any reaction on the inside of your mouth or nose? No Did you need to seek medical attention at a hospital or  doctor's office? Yes When did it last happen?     young adult or middle aged adult If all above answers are "NO", may proceed with cephalosporin use.    Shrimp [Shellfish Allergy] Anaphylaxis    Past Medical History:  Diagnosis Date   Hyperlipidemia    Hypertension    Intertrochanteric fracture of femur (HCC)    Left   Thyroid disease     Past Surgical History:  Procedure Laterality Date   APPENDECTOMY     FEMUR IM NAIL Left 07/06/2019   Procedure: INTRAMEDULLARY (IM) NAIL LEFT HIP;  Surgeon: Sheral Apley, MD;  Location: [facility] OR;  Service: Orthopedics;  Laterality: Left;   FRACTURE SURGERY     HEMORROIDECTOMY     left arm fracture     ORIF TIBIA & FIBULA FRACTURES     right leg   PARTIAL HYSTERECTOMY     SHOULDER ARTHROSCOPY     left   TONSILLECTOMY AND ADENOIDECTOMY       reports that she has never smoked. She has never used smokeless tobacco. She reports that she does not drink alcohol and does not use drugs.  Family History  Problem Relation Age of Onset   Cancer Other     Prior to Admission medications   Medication Sig Start Date End Date Taking? Authorizing Provider  acetaminophen (TYLENOL) 650 MG CR tablet Take 650 mg by mouth 3 (three) times daily.   Yes [provider]  Amino Acids-Protein Hydrolys (PRO-STAT PO) Take 30 mLs by mouth in the morning and at bedtime.   Yes [provider]  carboxymethylcellulose 1 % ophthalmic solution Place 1 drop into both eyes 4 (four) times daily.   Yes [provider]  diclofenac Sodium (VOLTAREN) 1 % GEL Apply 2 g topically at bedtime. Apply to each knee.   Yes [provider]  gabapentin (NEURONTIN) 100 MG capsule Take 100 mg by mouth 3 (three) times daily.   Yes [provider]  lamoTRIgine (LAMICTAL) 25 MG tablet Take 25 mg by mouth daily.   Yes [provider]  LORazepam (ATIVAN) 2 MG/ML injection Inject 2 mg into the muscle once.   Yes [provider]   metoprolol succinate (TOPROL XL) 25 MG 24 hr tablet Take 0.5 tablets (12.5 mg total) by mouth daily. 08/01/19  Yes Pricilla Riffle, MD  nitroGLYCERIN (NITROSTAT) 0.4 MG SL tablet Place 0.4 mg under the tongue every 5 (five) minutes as needed for chest pain (3 doses max).    Yes [provider]  Nutritional Supplements (NUTRITIONAL SUPPLEMENT PO) Take 8 oz by mouth in the morning and at bedtime. House supplement   Yes [provider]  Nutritional Supplements (NUTRITIONAL SUPPLEMENT PO) Take 1 Container by mouth in the morning and at bedtime. Fortified pudding   Yes [provider]  polyethylene glycol (MIRALAX) 17 g packet Take 17 g by mouth daily. 07/11/19  Yes Lorretta Harp, MD  potassium chloride (KLOR-CON) 10 MEQ tablet Take 10 mEq by mouth daily.   Yes [provider]  Propylene Glycol (SYSTANE BALANCE) 0.6 % SOLN Place 1 drop into both eyes 4 (four) times daily.   Yes [provider]  QUEtiapine (SEROQUEL) 25 MG tablet Take 25 mg by mouth at bedtime.   Yes [provider]  sennosides-docusate sodium (SENOKOT-S) 8.6-50 MG tablet Take 2 tablets by mouth at bedtime.   Yes [provider]  Torsemide 40 MG TABS Take 40 mg by mouth daily.   Yes [provider]  traMADol (ULTRAM) 50 MG tablet Take 50 mg by mouth 3 (three) times daily.   Yes [provider]    Physical Exam: Vitals:   06/08/23 0730 06/08/23 0845 06/08/23 0900 06/08/23 0920  BP: (!) 56/32  (!) 57/37 (!) 120/27  Pulse: (!) 101 96 95 94  Resp: (!) 25 13 13 16   Temp:    97.8 F (36.6 C)  TempSrc:    Oral  SpO2: 100% 100% 100% 98%   Physical Exam Constitutional:      Appearance: She is ill-appearing and toxic-appearing.  HENT:     Head: Normocephalic.     Nose: Nose normal.     Mouth/Throat:     Mouth: Mucous membranes are moist.  Eyes:     Pupils: Pupils are equal, round, and reactive to light.  Cardiovascular:     Rate and Rhythm: Normal rate  and regular rhythm.     Pulses: Normal pulses.     Heart sounds: Normal heart sounds.  Pulmonary:     Effort: Pulmonary effort is normal.     Breath sounds: Normal breath sounds.  Abdominal:     General: Abdomen is flat. Bowel sounds are normal.  Musculoskeletal:        General: Normal range of motion.     Cervical back: Normal range of motion.  Skin:    General: Skin is warm.     Capillary Refill: Capillary refill takes less than 2 seconds.  Neurological:     General: No focal deficit present.     Mental Status:  Mental status is at baseline.  Psychiatric:        Mood and Affect: Mood normal.        Labs on Admission: I have personally reviewed the patients's labs and imaging studies.  Assessment/Plan Principal Problem:   Sepsis (HCC)   # Severe sepsis secondary to likely UTI # Acute hypoxic respiratory failure -patient presenting with altered mental status and worsening hypoxia - Aggressive measures not within patient's goals of care  Plan: Pursue comfort measures Consult case management for hospice placement  # History of CHF-resume home torsemide dosing  # Insomnia-resume home [place]  # Goals of care-pursue comfort measures    Admission status: Observation Palliative Care  Certification: The appropriate patient status for this patient is OBSERVATION. Observation status is judged to be reasonable and necessary in order to provide the required intensity of service to ensure the patient's safety. The patient's presenting symptoms, physical exam findings, and initial radiographic and laboratory data in the context of their medical condition is felt to place them at decreased risk for further clinical deterioration. Furthermore, it is anticipated that the patient will be medically stable for discharge from the hospital within 2 midnights of admission.     Alan Mulder MD [facility] If 7PM-7AM, please contact night-coverage [email or web address]  06/08/2023,  9:45 AM

## 2023-06-08 NOTE — ED Notes (Signed)
ED TO INPATIENT HANDOFF REPORT  ED Nurse Name and Phone #: Macgregor Aeschliman [phone]  S Name/Age/Gender Lauren Moreno 89 y.o. female Room/Bed: 031C/031C  Code Status   Code Status: Do not attempt resuscitation (DNR) - Comfort care  Home/SNF/Other Skilled nursing facility Patient oriented to: none Is this baseline?   Triage Complete: Triage complete  Chief Complaint Sepsis [facility]) [A41.9]  Triage Note Patient from [facility], DNR in place, there being treated for dehydration with subcutaneous IV fluids. Patient found by staff around 2am with blood in her mouth and lethargic. EMS called.    Allergies Allergies  Allergen Reactions   Penicillins Anaphylaxis    Did it involve swelling of the face/tongue/throat, SOB, or low BP? Yes Did it involve sudden or severe rash/hives, skin peeling, or any reaction on the inside of your mouth or nose? No Did you need to seek medical attention at a hospital or doctor's office? Yes When did it last happen?     young adult or middle aged adult If all above answers are "NO", may proceed with cephalosporin use.    Shrimp [Shellfish Allergy] Anaphylaxis    Level of Care/Admitting Diagnosis ED Disposition     ED Disposition  Admit   Condition  --   Comment  Hospital Area: [facility] [[id number]]  Level of Care: Palliative Care [15]  May place patient in observation at [facility] or [facility] if equivalent level of care is available:: Yes  Covid Evaluation: Asymptomatic - no recent exposure (last 10 days) testing not required  Diagnosis: Sepsis [facility]) [[id number]]  Admitting Physician: Alan Mulder [[id number]]  Attending Physician: Alan Mulder [[id number]]  Bed request comments: comfort care          B Medical/Surgery History Past Medical History:  Diagnosis Date   Hyperlipidemia    Hypertension    Intertrochanteric fracture of femur (HCC)    Left   Thyroid disease    Past Surgical History:  Procedure  Laterality Date   APPENDECTOMY     FEMUR IM NAIL Left 07/06/2019   Procedure: INTRAMEDULLARY (IM) NAIL LEFT HIP;  Surgeon: Sheral Apley, MD;  Location: [facility] OR;  Service: Orthopedics;  Laterality: Left;   FRACTURE SURGERY     HEMORROIDECTOMY     left arm fracture     ORIF TIBIA & FIBULA FRACTURES     right leg   PARTIAL HYSTERECTOMY     SHOULDER ARTHROSCOPY     left   TONSILLECTOMY AND ADENOIDECTOMY       A IV Location/Drains/Wounds Patient Lines/Drains/Airways Status     Active Line/Drains/Airways     Name Placement date Placement time Site Days   Peripheral IV 06/08/23 20 G Right Wrist 06/08/23  0512  Wrist   less than 1   External Urinary Catheter 06/08/23  0700  --  less than 1            Intake/Output Last 24 hours  Intake/Output Summary (Last 24 hours) at 06/08/2023 9562 Last data filed at 06/08/2023 0725 Gross per 24 hour  Intake 100 ml  Output --  Net 100 ml    Labs/Imaging Results for orders placed or performed during the hospital encounter of 06/08/23 (from the past 48 hour(s))  Resp panel by RT-PCR (RSV, Flu A&B, Covid) Anterior Nasal Swab     Status: None   Collection Time: 06/08/23  5:01 AM   Specimen: Anterior Nasal Swab  Result Value Ref Range   SARS Coronavirus 2  by RT PCR NEGATIVE NEGATIVE   Influenza A by PCR NEGATIVE NEGATIVE   Influenza B by PCR NEGATIVE NEGATIVE    Comment: (NOTE) The Xpert Xpress SARS-CoV-2/FLU/RSV plus assay is intended as an aid in the diagnosis of influenza from Nasopharyngeal swab specimens and should not be used as a sole basis for treatment. Nasal washings and aspirates are unacceptable for Xpert Xpress SARS-CoV-2/FLU/RSV testing.  Fact Sheet for Patients: BloggerCourse.com  Fact Sheet for Healthcare Providers: SeriousBroker.it  This test is not yet approved or cleared by the [place] FDA and has been authorized for detection and/or diagnosis of SARS-CoV-2  by FDA under an Emergency Use Authorization (EUA). This EUA will remain in effect (meaning this test can be used) for the duration of the COVID-19 declaration under Section 564(b)(1) of the Act, 21 U.S.C. section 360bbb-3(b)(1), unless the authorization is terminated or revoked.     Resp Syncytial Virus by PCR NEGATIVE NEGATIVE    Comment: (NOTE) Fact Sheet for Patients: BloggerCourse.com  Fact Sheet for Healthcare Providers: SeriousBroker.it  This test is not yet approved or cleared by the [place] FDA and has been authorized for detection and/or diagnosis of SARS-CoV-2 by FDA under an Emergency Use Authorization (EUA). This EUA will remain in effect (meaning this test can be used) for the duration of the COVID-19 declaration under Section 564(b)(1) of the Act, 21 U.S.C. section 360bbb-3(b)(1), unless the authorization is terminated or revoked.  Performed at [facility], [address] [place], [place] [address]   Comprehensive metabolic panel     Status: Abnormal   Collection Time: 06/08/23  5:02 AM  Result Value Ref Range   Sodium 138 135 - 145 mmol/L   Potassium 3.5 3.5 - 5.1 mmol/L   Chloride 101 98 - 111 mmol/L   CO2 24 22 - 32 mmol/L   Glucose, Bld 86 70 - 99 mg/dL    Comment: Glucose reference range applies only to samples taken after fasting for at least 8 hours.   BUN 16 8 - 23 mg/dL   Creatinine, Ser 0.10 (H) 0.44 - 1.00 mg/dL   Calcium 8.3 (L) 8.9 - 10.3 mg/dL   Total Protein 6.1 (L) 6.5 - 8.1 g/dL   Albumin <2.7 (L) 3.5 - 5.0 g/dL   AST 253 (H) 15 - 41 U/L   ALT 115 (H) 0 - 44 U/L   Alkaline Phosphatase 410 (H) 38 - 126 U/L   Total Bilirubin 3.6 (H) 0.3 - 1.2 mg/dL   GFR, Estimated 32 (L) >60 mL/min    Comment: (NOTE) Calculated using the CKD-EPI Creatinine Equation (2021)    Anion gap 13 5 - 15    Comment: Performed at [facility], [address] [place], [place] [address]  CBC with  Differential     Status: Abnormal   Collection Time: 06/08/23  5:02 AM  Result Value Ref Range   WBC 27.1 (H) 4.0 - 10.5 K/uL   RBC 3.98 3.87 - 5.11 MIL/uL   Hemoglobin 13.3 12.0 - 15.0 g/dL   HCT 34.7 42.5 - 95.6 %   MCV 103.5 (H) 80.0 - 100.0 fL   MCH 33.4 26.0 - 34.0 pg   MCHC 32.3 30.0 - 36.0 g/dL   RDW 38.7 (H) 56.4 - 33.2 %   Platelets 312 150 - 400 K/uL   nRBC 0.1 0.0 - 0.2 %   Neutrophils Relative % 91 %   Neutro Abs 24.6 (H) 1.7 - 7.7 K/uL  Lymphocytes Relative 3 %   Lymphs Abs 0.8 0.7 - 4.0 K/uL   Monocytes Relative 3 %   Monocytes Absolute 0.9 0.1 - 1.0 K/uL   Eosinophils Relative 1 %   Eosinophils Absolute 0.1 0.0 - 0.5 K/uL   Basophils Relative 0 %   Basophils Absolute 0.1 0.0 - 0.1 K/uL   Immature Granulocytes 2 %   Abs Immature Granulocytes 0.61 (H) 0.00 - 0.07 K/uL    Comment: Performed at [facility], [address], [place], [place] [address]  Protime-INR     Status: Abnormal   Collection Time: 06/08/23  5:02 AM  Result Value Ref Range   Prothrombin Time 15.4 (H) 11.4 - 15.2 seconds   INR 1.2 0.8 - 1.2    Comment: (NOTE) INR goal varies based on device and disease states. Performed at [facility], [address], [place], [place] [address]   APTT     Status: None   Collection Time: 06/08/23  5:02 AM  Result Value Ref Range   aPTT 29 24 - 36 seconds    Comment: Performed at [facility], [address], [place], [place] [address]  I-Stat Lactic Acid, ED     Status: Abnormal   Collection Time: 06/08/23  5:15 AM  Result Value Ref Range   Lactic Acid, Venous 3.9 (HH) 0.5 - 1.9 mmol/L   Comment NOTIFIED PHYSICIAN    CT ABDOMEN PELVIS WO CONTRAST  Result Date: 06/08/2023 CLINICAL DATA:  89 year old female with history of sepsis. EXAM: CT ABDOMEN AND PELVIS WITHOUT CONTRAST TECHNIQUE: Multidetector CT imaging of the abdomen and pelvis was performed following the standard protocol without IV contrast. RADIATION DOSE REDUCTION: This exam  was performed according to the departmental dose-optimization program which includes automated exposure control, adjustment of the mA and/or kV according to patient size and/or use of iterative reconstruction technique. COMPARISON:  None Available. FINDINGS: Lower chest: Small bilateral pleural effusions. Dependent opacities in the lower lobes of the lungs bilaterally which likely reflect a combination of atelectasis and airspace consolidation. Hepatobiliary: No definite suspicious cystic or solid hepatic lesions are confidently identified on today's noncontrast CT examination. Gallbladder is moderately distended. There is some intermediate attenuation material lying dependently in the gallbladder, likely reflective of biliary sludge. No pericholecystic fluid or inflammatory changes confidently identified. Pancreas: No definite pancreatic mass or peripancreatic fluid collections or inflammatory changes noted on today's noncontrast CT examination. Spleen: Unremarkable. Adrenals/Urinary Tract: Mild bilateral renal atrophy. Unenhanced appearance of the kidneys and bilateral adrenal glands is otherwise unremarkable. No hydroureteronephrosis. Urinary bladder is nearly completely decompressed. There is a small amount of gas non dependently in the lumen of the urinary bladder. Additionally, there is a small amount of high attenuation material in the base of the bladder near the urethral orifice (axial image 79 of series 3) measuring 1.9 x 1.0 cm. Stomach/Bowel: The appearance of the stomach is unremarkable. No pathologic dilatation of small bowel or colon. Numerous colonic diverticuli are noted, without definite focal surrounding inflammatory changes to clearly indicate an acute diverticulitis at this time. The appendix is not confidently identified and may be surgically absent. Regardless, there are no inflammatory changes noted adjacent to the cecum to suggest the presence of an acute appendicitis at this time.  Vascular/Lymphatic: Atherosclerosis in the abdominal aorta and pelvic vasculature. No lymphadenopathy noted in the abdomen or pelvis. Reproductive: Status post hysterectomy. Low-attenuation lesions associated with both ovaries, largest of which on the right measures 3.6 x  2.6 cm (axial image 65 of series 3). Other: No significant volume of ascites.  No pneumoperitoneum. Musculoskeletal: In the inferior aspect of the right breast (axial image 30 of series 3) there is a 1.5 x 1.3 cm soft tissue attenuation nodule, nonspecific. There are no aggressive appearing lytic or blastic lesions noted in the visualized portions of the skeleton. Postoperative changes of ORIF are noted in the left proximal femur, incompletely imaged. IMPRESSION: 1. Gallbladder is moderately distended. There is some intermediate attenuation material lying dependently within the gallbladder. No overt pericholecystic fluid or surrounding inflammatory changes. If there is clinical concern for acute cholecystitis, further evaluation with right upper quadrant abdominal ultrasound should be considered. 2. Tiny amount of gas non dependently in the lumen of the urinary bladder. This could be iatrogenic if there has been recent catheterization for urinalysis. In the absence of a history of recent catheterization, correlation with urinalysis would be strongly recommended to exclude the possibility of urinary tract infection with gas-forming organisms. There is also a small amount of high attenuation material in the base of the urinary bladder adjacent to the urethral orifice. This is of uncertain etiology and significance. If there has been recent catheterization, this could represent a small amount of blood products. Alternatively, a partially calcified bladder stone could have this appearance. 3. Bibasilar opacities in the lungs likely reflective of a combination of atelectasis and consolidation. Clinical correlation for signs and symptoms of recent  aspiration is suggested. There also trace bilateral pleural effusions. 4. Colonic diverticulosis without evidence of acute diverticulitis at this time. 5. Simple appearing cyst in the right ovary, as above. Follow-up pelvic ultrasound should be considered in 6-12 months to ensure stability. 6. Small soft tissue attenuation nodule in the inferior aspect of the right breast, nonspecific. Correlation with nonemergent outpatient mammography could be considered if clinically appropriate. Electronically Signed   By: Trudie Reed M.D.   On: 06/08/2023 06:36   CT Head Wo Contrast  Result Date: 06/08/2023 CLINICAL DATA:  Mental status change with unknown cause EXAM: CT HEAD WITHOUT CONTRAST TECHNIQUE: Contiguous axial images were obtained from the base of the skull through the vertex without intravenous contrast. RADIATION DOSE REDUCTION: This exam was performed according to the departmental dose-optimization program which includes automated exposure control, adjustment of the mA and/or kV according to patient size and/or use of iterative reconstruction technique. COMPARISON:  07/05/2019 FINDINGS: Brain: No evidence of acute infarction, hemorrhage, obstructive hydrocephalus, extra-axial collection or mass lesion/mass effect. Mental status change with brain atrophy which generalized ventriculomegaly. There is some degree of disproportionate subarachnoid spaces on coronal images but the overall pattern and stability is most consistent with atrophy. Vascular: No hyperdense vessel or unexpected calcification. Skull: Normal. Negative for fracture or focal lesion. Sinuses/Orbits: No acute finding. IMPRESSION: 1. No acute or interval finding. 2. Generalized atrophy. Electronically Signed   By: Tiburcio Pea M.D.   On: 06/08/2023 05:59   DG Chest Port 1 View  Result Date: 06/08/2023 CLINICAL DATA:  Possible sepsis. Elderly patient found at nursing home with altered mental status. EXAM: PORTABLE CHEST 1 VIEW COMPARISON:   Most recent prior chest x-ray 07/05/2019 FINDINGS: Limited frontal radiograph in expiratory phase. The lung volumes are very low. Patchy airspace opacities in the right perihilar region, left perihilar region and left lung base. Background of chronic bronchitic changes. Cardiopericardial silhouette is not well seen due to the very low lung volumes. Chronic degenerative changes of the left shoulder joint. IMPRESSION: 1. Very limited frontal view  of the chest in the expiratory phase of respiration resulting in very low lung volumes and limited utility. 2. Patchy airspace opacities in the bilateral hilar regions and left lower lobe may reflect areas of atelectasis or pneumonia. Recommend further evaluation with dedicated PA and lateral chest x-ray when the patient is able. Electronically Signed   By: Malachy Moan M.D.   On: 06/08/2023 05:25    Pending Labs Unresulted Labs (From admission, onward)     Start     Ordered   06/08/23 0557  Ammonia  Once,   STAT        06/08/23 0556   06/08/23 0454  Urinalysis, w/ Reflex to Culture (Infection Suspected) -Urine, Catheterized  (Undifferentiated presentation (screening labs and basic nursing orders))  ONCE - URGENT,   URGENT       Question:  Specimen Source  Answer:  Urine, Catheterized   06/08/23 0453   06/08/23 0453  Blood Culture (routine x 2)  (Undifferentiated presentation (screening labs and basic nursing orders))  BLOOD CULTURE X 2,   STAT      06/08/23 0453            Vitals/Pain Today's Vitals   06/08/23 0630 06/08/23 0645 06/08/23 0700 06/08/23 0730  BP: (!) 62/42 (!) 62/38 (!) 59/31 (!) 56/32  Pulse: (!) 107 (!) 101 97 (!) 101  Resp: 17 15 16  (!) 25  Temp:      TempSrc:      SpO2: 96% 98% 99% 100%    Isolation Precautions Droplet precaution  Medications Medications  vancomycin (VANCOREADY) IVPB 1500 mg/300 mL (1,500 mg Intravenous New Bag/Given 06/08/23 0730)  0.9 %  sodium chloride infusion (has no administration in time  range)  acetaminophen (TYLENOL) tablet 650 mg (has no administration in time range)    Or  acetaminophen (TYLENOL) suppository 650 mg (has no administration in time range)  glycopyrrolate (ROBINUL) tablet 1 mg (has no administration in time range)    Or  glycopyrrolate (ROBINUL) injection 0.2 mg (has no administration in time range)    Or  glycopyrrolate (ROBINUL) injection 0.2 mg (has no administration in time range)  polyvinyl alcohol (LIQUIFILM TEARS) 1.4 % ophthalmic solution 1 drop (has no administration in time range)  morphine (PF) 2 MG/ML injection 2-4 mg (has no administration in time range)  LORazepam (ATIVAN) injection 2-4 mg (has no administration in time range)  sodium chloride 0.9 % bolus 1,000 mL (1,000 mLs Intravenous New Bag/Given 06/08/23 0600)  cefTRIAXone (ROCEPHIN) 2 g in sodium chloride 0.9 % 100 mL IVPB (0 g Intravenous Stopped 06/08/23 0725)  sodium chloride 0.9 % bolus 500 mL (500 mLs Intravenous New Bag/Given 06/08/23 0725)    Mobility      Focused Assessments Cardiac Assessment Handoff:  Cardiac Rhythm: (S) Atrial fibrillation Lab Results  Component Value Date   CKTOTAL 53 03/17/2011   CKMB 1.8 03/17/2011   TROPONINI <0.30 03/17/2011   Lab Results  Component Value Date   DDIMER (H) 01/06/2009    0.56        AT THE INHOUSE ESTABLISHED CUTOFF VALUE OF 0.48 ug/mL FEU, THIS ASSAY HAS BEEN DOCUMENTED IN THE LITERATURE TO HAVE A SENSITIVITY AND NEGATIVE PREDICTIVE VALUE OF AT LEAST 98 TO 99%.  THE TEST RESULT SHOULD BE CORRELATED WITH AN ASSESSMENT OF THE CLINICAL PROBABILITY OF DVT / VTE.   Does the Patient currently have chest pain? No    R Recommendations: See Admitting Provider Note  Report given to:  Additional Notes: Comfort Care

## 2023-06-08 NOTE — ED Triage Notes (Signed)
Patient from [facility] facility, DNR in place, there being treated for dehydration with subcutaneous IV fluids. Patient found by staff around 2am with blood in her mouth and lethargic. EMS called.

## 2023-06-08 NOTE — ED Notes (Signed)
No urine output noted from in n out cath

## 2023-06-08 NOTE — Plan of Care (Signed)
UTA patient orientation level due to being too somnolent . Comfort measures continued and patient tolerating continuous fluids. Patient remains on 2L nasal cannula. Family refused SCD and repositioning every 2 hours. Patient's family educated on wearing proper PPE while in the room with the patient. Will continue to monitor.

## 2023-06-08 NOTE — Progress Notes (Signed)
PHARMACY - PHYSICIAN COMMUNICATION CRITICAL VALUE ALERT - BLOOD CULTURE IDENTIFICATION (BCID)  Lauren Moreno is an 89 y.o. female who presented to [facility] on 06/08/2023 with a chief complaint of AMS, believed to be as a result of sepsis 2/2 to a genitourinary source. Blood cultures growing 3/4 bottles GNRs, identified as Klebsiella pneumoniae without resistance mechanisms on BCID.   Assessment:  Lauren Moreno is an 89 y.o. female who presented to [facility] on 06/08/2023 with a chief complaint of AMS, believed to be as a result of sepsis 2/2 to a genitourinary source. Blood cultures growing 3/4 bottles GNRs, identified as Klebsiella pneumoniae without resistance mechanisms on BCID.   Name of physician (or Provider) Contacted: Alan Mulder, MD   Current antibiotics: none- CRO/Vanco x1 9/6 AM   Changes to prescribed antibiotics recommended: none- patient has been transitioned to comfort measures  Patient is on recommended antibiotics - No changes needed  Results for orders placed or performed during the hospital encounter of 06/08/23  Blood Culture ID Panel (Reflexed) (Collected: 06/08/2023  5:29 AM)  Result Value Ref Range   Enterococcus faecalis NOT DETECTED NOT DETECTED   Enterococcus Faecium NOT DETECTED NOT DETECTED   Listeria monocytogenes NOT DETECTED NOT DETECTED   Staphylococcus species DETECTED (A) NOT DETECTED   Staphylococcus aureus (BCID) NOT DETECTED NOT DETECTED   Staphylococcus epidermidis NOT DETECTED NOT DETECTED   Staphylococcus lugdunensis NOT DETECTED NOT DETECTED   Streptococcus species NOT DETECTED NOT DETECTED   Streptococcus agalactiae NOT DETECTED NOT DETECTED   Streptococcus pneumoniae NOT DETECTED NOT DETECTED   Streptococcus pyogenes NOT DETECTED NOT DETECTED   A.calcoaceticus-baumannii NOT DETECTED NOT DETECTED   Bacteroides fragilis NOT DETECTED NOT DETECTED   Enterobacterales DETECTED (A) NOT DETECTED   Enterobacter cloacae complex NOT DETECTED  NOT DETECTED   Escherichia coli NOT DETECTED NOT DETECTED   Klebsiella aerogenes NOT DETECTED NOT DETECTED   Klebsiella oxytoca NOT DETECTED NOT DETECTED   Klebsiella pneumoniae DETECTED (A) NOT DETECTED   Proteus species NOT DETECTED NOT DETECTED   Salmonella species NOT DETECTED NOT DETECTED   Serratia marcescens NOT DETECTED NOT DETECTED   Haemophilus influenzae NOT DETECTED NOT DETECTED   Neisseria meningitidis NOT DETECTED NOT DETECTED   Pseudomonas aeruginosa NOT DETECTED NOT DETECTED   Stenotrophomonas maltophilia NOT DETECTED NOT DETECTED   Candida albicans NOT DETECTED NOT DETECTED   Candida auris NOT DETECTED NOT DETECTED   Candida glabrata NOT DETECTED NOT DETECTED   Candida krusei NOT DETECTED NOT DETECTED   Candida parapsilosis NOT DETECTED NOT DETECTED   Candida tropicalis NOT DETECTED NOT DETECTED   Cryptococcus neoformans/gattii NOT DETECTED NOT DETECTED   CTX-M ESBL NOT DETECTED NOT DETECTED   Carbapenem resistance IMP NOT DETECTED NOT DETECTED   Carbapenem resistance KPC NOT DETECTED NOT DETECTED   Carbapenem resistance NDM NOT DETECTED NOT DETECTED   Carbapenem resist OXA 48 LIKE NOT DETECTED NOT DETECTED   Carbapenem resistance VIM NOT DETECTED NOT DETECTED   Jani Gravel, PharmD Clinical Pharmacist  06/08/2023 8:05 PM

## 2023-06-08 NOTE — Progress Notes (Signed)
ED Pharmacy Antibiotic Sign Off An antibiotic consult was received from an ED provider for vancomcyin per pharmacy dosing for sepsis. A chart review was completed to assess appropriateness.   The following one time order(s) were placed:  Vancomycin 1500mg  x 1  Further antibiotic and/or antibiotic pharmacy consults should be ordered by the admitting provider if indicated.   Thank you for allowing pharmacy to be a part of this patient's care.   Marja Kays, [facility]  Clinical Pharmacist 06/08/23 6:42 AM

## 2023-06-08 NOTE — ED Provider Notes (Signed)
Lauren Moreno EMERGENCY DEPARTMENT AT [facility] Provider Note   CSN: [id number] Arrival date & time: 06/08/23  5784     History  Chief Complaint  Patient presents with   Altered Mental Status    Lauren Moreno is a 89 y.o. female.  89 year old female brought in by EMS from nursing facility with concern for blood in her mouth.  Patient was checked on by staff at 2 AM today, found to have a blood in her mouth, family was contacted and patient was sent to the emergency room.  Patient is on baseline 2 L nasal cannula, EMS found her in the upper 80s and placed her on 10 L nonrebreather.  Patient's son and daughter at bedside states that patient seemed altered yesterday however seem to improve throughout the day and ate well, facility thought patient may have a UTI (started her on abx), or have had a stroke.  No reports of trauma.  Patient is responsive to deep painful stimuli on arrival only. DNR and MOST form at bedside.       Home Medications Prior to Admission medications   Medication Sig Start Date End Date Taking? Authorizing Provider  acetaminophen (TYLENOL) 500 MG tablet Take 1 tablet (500 mg total) by mouth every 6 (six) hours as needed for mild pain or headache. 07/11/19   Lorretta Harp, MD  aspirin EC 81 MG tablet Take 81 mg by mouth every morning.     [provider]  diphenhydrAMINE (BENADRYL) 25 MG tablet Take 25 mg by mouth every 6 (six) hours as needed for allergies.    [provider]  enoxaparin (LOVENOX) 40 MG/0.4ML injection Inject 0.4 mLs (40 mg total) into the skin daily for 30 doses. For 30 days post op for DVT prophylaxis 07/06/19 08/05/19  Albina Billet III, PA-C  metoprolol succinate (TOPROL XL) 25 MG 24 hr tablet Take 0.5 tablets (12.5 mg total) by mouth daily. 08/01/19   Pricilla Riffle, MD  mirtazapine (REMERON) 7.5 MG tablet Take 1 tablet (7.5 mg total) by mouth at bedtime as needed (insomnia). 07/11/19   Lorretta Harp, MD  Multiple  Vitamin (MULTIVITAMIN WITH MINERALS) TABS tablet Take 1 tablet by mouth every morning.     [provider]  nitroGLYCERIN (NITROSTAT) 0.4 MG SL tablet Place 0.4 mg under the tongue every 5 (five) minutes as needed for chest pain (3 doses max).     [provider]  ondansetron (ZOFRAN) 4 MG tablet Take 1 tablet (4 mg total) by mouth every 6 (six) hours as needed for nausea. 07/11/19   Lorretta Harp, MD  oxyCODONE-acetaminophen (PERCOCET/ROXICET) 5-325 MG tablet Take 1 tablet by mouth every 6 (six) hours as needed for severe pain. 10/17/22   Lorre Nick, MD  polyethylene glycol (MIRALAX) 17 g packet Take 17 g by mouth daily. 07/11/19   Lorretta Harp, MD      Allergies    Penicillins and Shrimp [shellfish allergy]    Review of Systems   Review of Systems Level 5 caveat for change in mental status Physical Exam Updated Vital Signs BP (!) 62/38   Pulse (!) 101   Temp 97.8 F (36.6 C) (Axillary)   Resp 15   SpO2 98%  Physical Exam Vitals and nursing note reviewed.  Constitutional:      General: She is not in acute distress.    Appearance: She is well-developed. She is ill-appearing. She is not diaphoretic.  HENT:     Head: Normocephalic and  atraumatic.     Nose: Nose normal.     Mouth/Throat:     Mouth: Mucous membranes are dry.  Eyes:     Conjunctiva/sclera: Conjunctivae normal.     Pupils: Pupils are equal, round, and reactive to light.  Cardiovascular:     Rate and Rhythm: Regular rhythm. Tachycardia present.     Heart sounds: Normal heart sounds.  Pulmonary:     Effort: Pulmonary effort is normal.  Abdominal:     Palpations: Abdomen is soft.     Tenderness: There is no abdominal tenderness.  Musculoskeletal:     Cervical back: Neck supple.     Right lower leg: Edema present.     Left lower leg: Edema present.  Skin:    General: Skin is dry.     Coloration: Skin is pale.     Comments: Cool to the touch  Neurological:     Motor: Weakness present.      Comments: Moans with deep painful stimuli to lower extremities. Will squeeze daughter's hand with her left hand     ED Results / Procedures / Treatments   Labs (all labs ordered are listed, but only abnormal results are displayed) Labs Reviewed  COMPREHENSIVE METABOLIC PANEL - Abnormal; Notable for the following components:      Result Value   Creatinine, Ser 1.53 (*)    Calcium 8.3 (*)    Total Protein 6.1 (*)    Albumin <1.5 (*)    AST 506 (*)    ALT 115 (*)    Alkaline Phosphatase 410 (*)    Total Bilirubin 3.6 (*)    GFR, Estimated 32 (*)    All other components within normal limits  CBC WITH DIFFERENTIAL/PLATELET - Abnormal; Notable for the following components:   WBC 27.1 (*)    MCV 103.5 (*)    RDW 17.3 (*)    Neutro Abs 24.6 (*)    Abs Immature Granulocytes 0.61 (*)    All other components within normal limits  PROTIME-INR - Abnormal; Notable for the following components:   Prothrombin Time 15.4 (*)    All other components within normal limits  I-STAT CG4 LACTIC ACID, ED - Abnormal; Notable for the following components:   Lactic Acid, Venous 3.9 (*)    All other components within normal limits  RESP PANEL BY RT-PCR (RSV, FLU A&B, COVID)  RVPGX2  CULTURE, BLOOD (ROUTINE X 2)  CULTURE, BLOOD (ROUTINE X 2)  APTT  URINALYSIS, W/ REFLEX TO CULTURE (INFECTION SUSPECTED)  AMMONIA  I-STAT CG4 LACTIC ACID, ED    EKG None  Radiology CT ABDOMEN PELVIS WO CONTRAST  Result Date: 06/08/2023 CLINICAL DATA:  89 year old female with history of sepsis. EXAM: CT ABDOMEN AND PELVIS WITHOUT CONTRAST TECHNIQUE: Multidetector CT imaging of the abdomen and pelvis was performed following the standard protocol without IV contrast. RADIATION DOSE REDUCTION: This exam was performed according to the departmental dose-optimization program which includes automated exposure control, adjustment of the mA and/or kV according to patient size and/or use of iterative reconstruction technique.  COMPARISON:  None Available. FINDINGS: Lower chest: Small bilateral pleural effusions. Dependent opacities in the lower lobes of the lungs bilaterally which likely reflect a combination of atelectasis and airspace consolidation. Hepatobiliary: No definite suspicious cystic or solid hepatic lesions are confidently identified on today's noncontrast CT examination. Gallbladder is moderately distended. There is some intermediate attenuation material lying dependently in the gallbladder, likely reflective of biliary sludge. No pericholecystic fluid or inflammatory changes confidently identified.  Pancreas: No definite pancreatic mass or peripancreatic fluid collections or inflammatory changes noted on today's noncontrast CT examination. Spleen: Unremarkable. Adrenals/Urinary Tract: Mild bilateral renal atrophy. Unenhanced appearance of the kidneys and bilateral adrenal glands is otherwise unremarkable. No hydroureteronephrosis. Urinary bladder is nearly completely decompressed. There is a small amount of gas non dependently in the lumen of the urinary bladder. Additionally, there is a small amount of high attenuation material in the base of the bladder near the urethral orifice (axial image 79 of series 3) measuring 1.9 x 1.0 cm. Stomach/Bowel: The appearance of the stomach is unremarkable. No pathologic dilatation of small bowel or colon. Numerous colonic diverticuli are noted, without definite focal surrounding inflammatory changes to clearly indicate an acute diverticulitis at this time. The appendix is not confidently identified and may be surgically absent. Regardless, there are no inflammatory changes noted adjacent to the cecum to suggest the presence of an acute appendicitis at this time. Vascular/Lymphatic: Atherosclerosis in the abdominal aorta and pelvic vasculature. No lymphadenopathy noted in the abdomen or pelvis. Reproductive: Status post hysterectomy. Low-attenuation lesions associated with both ovaries,  largest of which on the right measures 3.6 x 2.6 cm (axial image 65 of series 3). Other: No significant volume of ascites.  No pneumoperitoneum. Musculoskeletal: In the inferior aspect of the right breast (axial image 30 of series 3) there is a 1.5 x 1.3 cm soft tissue attenuation nodule, nonspecific. There are no aggressive appearing lytic or blastic lesions noted in the visualized portions of the skeleton. Postoperative changes of ORIF are noted in the left proximal femur, incompletely imaged. IMPRESSION: 1. Gallbladder is moderately distended. There is some intermediate attenuation material lying dependently within the gallbladder. No overt pericholecystic fluid or surrounding inflammatory changes. If there is clinical concern for acute cholecystitis, further evaluation with right upper quadrant abdominal ultrasound should be considered. 2. Tiny amount of gas non dependently in the lumen of the urinary bladder. This could be iatrogenic if there has been recent catheterization for urinalysis. In the absence of a history of recent catheterization, correlation with urinalysis would be strongly recommended to exclude the possibility of urinary tract infection with gas-forming organisms. There is also a small amount of high attenuation material in the base of the urinary bladder adjacent to the urethral orifice. This is of uncertain etiology and significance. If there has been recent catheterization, this could represent a small amount of blood products. Alternatively, a partially calcified bladder stone could have this appearance. 3. Bibasilar opacities in the lungs likely reflective of a combination of atelectasis and consolidation. Clinical correlation for signs and symptoms of recent aspiration is suggested. There also trace bilateral pleural effusions. 4. Colonic diverticulosis without evidence of acute diverticulitis at this time. 5. Simple appearing cyst in the right ovary, as above. Follow-up pelvic ultrasound  should be considered in 6-12 months to ensure stability. 6. Small soft tissue attenuation nodule in the inferior aspect of the right breast, nonspecific. Correlation with nonemergent outpatient mammography could be considered if clinically appropriate. Electronically Signed   By: Trudie Reed M.D.   On: 06/08/2023 06:36   CT Head Wo Contrast  Result Date: 06/08/2023 CLINICAL DATA:  Mental status change with unknown cause EXAM: CT HEAD WITHOUT CONTRAST TECHNIQUE: Contiguous axial images were obtained from the base of the skull through the vertex without intravenous contrast. RADIATION DOSE REDUCTION: This exam was performed according to the departmental dose-optimization program which includes automated exposure control, adjustment of the mA and/or kV according to patient size  and/or use of iterative reconstruction technique. COMPARISON:  07/05/2019 FINDINGS: Brain: No evidence of acute infarction, hemorrhage, obstructive hydrocephalus, extra-axial collection or mass lesion/mass effect. Mental status change with brain atrophy which generalized ventriculomegaly. There is some degree of disproportionate subarachnoid spaces on coronal images but the overall pattern and stability is most consistent with atrophy. Vascular: No hyperdense vessel or unexpected calcification. Skull: Normal. Negative for fracture or focal lesion. Sinuses/Orbits: No acute finding. IMPRESSION: 1. No acute or interval finding. 2. Generalized atrophy. Electronically Signed   By: Tiburcio Pea M.D.   On: 06/08/2023 05:59   DG Chest Port 1 View  Result Date: 06/08/2023 CLINICAL DATA:  Possible sepsis. Elderly patient found at nursing home with altered mental status. EXAM: PORTABLE CHEST 1 VIEW COMPARISON:  Most recent prior chest x-ray 07/05/2019 FINDINGS: Limited frontal radiograph in expiratory phase. The lung volumes are very low. Patchy airspace opacities in the right perihilar region, left perihilar region and left lung base.  Background of chronic bronchitic changes. Cardiopericardial silhouette is not well seen due to the very low lung volumes. Chronic degenerative changes of the left shoulder joint. IMPRESSION: 1. Very limited frontal view of the chest in the expiratory phase of respiration resulting in very low lung volumes and limited utility. 2. Patchy airspace opacities in the bilateral hilar regions and left lower lobe may reflect areas of atelectasis or pneumonia. Recommend further evaluation with dedicated PA and lateral chest x-ray when the patient is able. Electronically Signed   By: Malachy Moan M.D.   On: 06/08/2023 05:25    Procedures .Critical Care  Performed by: Jeannie Fend, PA-C Authorized by: Jeannie Fend, PA-C   Critical care provider statement:    Critical care time (minutes):  75   Critical care was time spent personally by me on the following activities:  Development of treatment plan with patient or surrogate, discussions with consultants, evaluation of patient's response to treatment, examination of patient, ordering and review of laboratory studies, ordering and review of radiographic studies, ordering and performing treatments and interventions, pulse oximetry, re-evaluation of patient's condition and review of old charts     Medications Ordered in ED Medications  cefTRIAXone (ROCEPHIN) 2 g in sodium chloride 0.9 % 100 mL IVPB (2 g Intravenous New Bag/Given 06/08/23 0658)  sodium chloride 0.9 % bolus 500 mL (has no administration in time range)  vancomycin (VANCOREADY) IVPB 1500 mg/300 mL (has no administration in time range)  sodium chloride 0.9 % bolus 1,000 mL (1,000 mLs Intravenous New Bag/Given 06/08/23 0600)    ED Course/ Medical Decision Making/ A&P                                 Medical Decision Making  This patient presents to the ED for concern of altered mental status, this involves an extensive number of treatment options, and is a complaint that carries with it a  high risk of complications and morbidity.  The differential diagnosis includes but not limited to sepsis, UTI, CVA, PNA, gi bleed   Co morbidities that complicate the patient evaluation  Anemia, hypertension   Additional history obtained:  Additional history obtained from patient's daughter and son at bedside who contribute to history as above and confirmed patient's wishes for no intubation and no chest compressions.  At this time, it patient's family requests IV fluids, imaging, antibiotics and labs. External records from outside source obtained and reviewed including prior  labs on file for comparison.  Records sent from facility including MOST form, DNR, medical reconciliation and past medical history.   Lab Tests:  I Ordered, and personally interpreted labs.  The pertinent results include: CBC with significant leukocytosis white count 27.1 with increase in neutrophils.  CMP with AKI with creatinine of 1.53, AST elevated at 506, ALT elevated 115, increased alk phos of 410 with total bili of 3.6.  Lactic acid elevated at 3.9.   Imaging Studies ordered:  I ordered imaging studies including portable chest x-ray, CT head, CT abdomen pelvis without contrast I independently visualized and interpreted imaging which showed no acute intracranial findings.  Limited chest x-ray.  CT abdomen pelvis pending at time of signout to oncoming provider. I agree with the radiologist interpretation   Cardiac Monitoring: / EKG:  The patient was maintained on a cardiac monitor.  I personally viewed and interpreted the cardiac monitored which showed an underlying rhythm of: Sinus arrhythmia, rate 125.   Consultations Obtained:  I requested consultation with the ER attending, Dr. Blinda Leatherwood,  and discussed lab and imaging findings as well as pertinent plan - they recommend: Has seen the patient, agrees with sepsis workup   Problem List / ED Course / Critical interventions / Medication  management  89 year old female presents from nursing facility via EMS with concern for blood from her mouth tonight.  Patient was found in her bed with decreased level of consciousness and dried blood to her mouth.  No reports of recent falls.  Patient's family at bedside states that she has been confused more than usual recently although seemed to improve throughout the day yesterday and did eat well.  Facility was concerned patient may have had a stroke or may have a urinary tract infection and patient was started on antibiotics, unknown what antibiotic.  She does have a history of UTIs.  Patient is ill in appearance on arrival.  EMS reports O2 sats in the 80s on her 2 L nasal cannula, this improved into the 90s with 10 L nonrebreather.  Patient moans slightly with deep painful stimuli, she does not really move her extremities other than squeezing her daughter's hand.  She does have a little bit of blood around her teeth, no obvious source.  Concern for sepsis, order set initiated.  Found of elevated lactic, significantly elevated white count, elevated creatinine and LFTs.  Started on Rocephin.  Chest x-ray with poor positioning.  CT head without significant findings.  Due to elevated LFTs, patient was sent back to CT for Noncon to consider acute cholecystitis as cause for her sepsis.  Urinalysis is pending collection by cath sample.  Patient does have a DNR with a MOST form.  Her MOST form indicates that she would like IV antibiotics.  Patient's wishes were discussed with her son and daughter at bedside who agree she would not want intubation or chest compressions and she does have a DNR at bedside.  Family would like IV fluids, IV antibiotics and admission to the hospital. Care signed out pending consult with hospitalist for admission.  CT abdomen pelvis returns with distended gallbladder, acute cholecystitis not completely ruled out, recommends further workup with ultrasound of the right upper quadrant.  All  results were discussed with patient's family at bedside.  Family is currently in agreement for comfort measures to include IV fluids, antibiotics, oxygen and pain medications as needed. I ordered medication including rocephin, IVF  for sepsis, concern for UTI  Reevaluation of the patient after these  medicines showed that the patient stayed the same I have reviewed the patients home medicines and have made adjustments as needed   Social Determinants of Health:  Lives at facility, has family locally   Test / Admission - Considered:  Admit         Final Clinical Impression(s) / ED Diagnoses Final diagnoses:  Sepsis, due to unspecified organism, unspecified whether acute organ dysfunction present [facility])  AKI (acute kidney injury) (HCC)  Elevated LFTs    Rx / DC Orders ED Discharge Orders     None         Jeannie Fend, PA-C 06/08/23 0705    Gilda Crease, MD 06/08/23 [phone]

## 2023-06-08 NOTE — ED Notes (Signed)
ED Provider at bedside (laura murphey, pa)

## 2023-06-08 NOTE — ED Provider Notes (Addendum)
Care assumed from previous provider.  See note for full HPI.  In summation 89 year old here for evaluation of altered mental status and lethargy.  Noted yesterday she was confused, facility started on antibiotics for UTI.  Was found today more confused at 2 AM.  Had some blood in her mouth.  No witnessed seizure-like activity.  On baseline 2 L.  On arrival to the ED patient tachycardic.  Patient is DNR, DNI.  Patient found to be in likely septic shock, possible cholecystitis versus pneumonia versus UTI.  Patient is not a surgical candidate.  Previous provider had long discussion with family.  Family is agreeable that patient is DNR, DNI, they do not want pressors.  They do want antibiotics and fluids.  Family aware that this is likely terminal.  Plan to admit to medicine Physical Exam  BP (!) 56/32   Pulse (!) 101   Temp 97.8 F (36.6 C) (Axillary)   Resp (!) 25   SpO2 100%   Physical Exam Vitals and nursing note reviewed.  Constitutional:      Appearance: She is well-developed. She is ill-appearing. She is not diaphoretic.  HENT:     Head: Atraumatic.     Mouth/Throat:     Mouth: Mucous membranes are dry.  Cardiovascular:     Rate and Rhythm: Tachycardia present.  Pulmonary:     Effort: No respiratory distress.     Comments: NRB Abdominal:     General: There is no distension.  Skin:    General: Skin is dry.  Neurological:     Comments: Unresponsive    Procedures  .Critical Care  Performed by: Linwood Dibbles, PA-C Authorized by: Linwood Dibbles, PA-C   Critical care provider statement:    Critical care time (minutes):  35   Critical care was necessary to treat or prevent imminent or life-threatening deterioration of the following conditions:  Circulatory failure, shock and sepsis   Critical care was time spent personally by me on the following activities:  Development of treatment plan with patient or surrogate, discussions with consultants, evaluation of patient's  response to treatment, examination of patient, ordering and review of laboratory studies, ordering and review of radiographic studies, ordering and performing treatments and interventions, pulse oximetry, re-evaluation of patient's condition and review of old charts  Labs Reviewed  COMPREHENSIVE METABOLIC PANEL - Abnormal; Notable for the following components:      Result Value   Creatinine, Ser 1.53 (*)    Calcium 8.3 (*)    Total Protein 6.1 (*)    Albumin <1.5 (*)    AST 506 (*)    ALT 115 (*)    Alkaline Phosphatase 410 (*)    Total Bilirubin 3.6 (*)    GFR, Estimated 32 (*)    All other components within normal limits  CBC WITH DIFFERENTIAL/PLATELET - Abnormal; Notable for the following components:   WBC 27.1 (*)    MCV 103.5 (*)    RDW 17.3 (*)    Neutro Abs 24.6 (*)    Abs Immature Granulocytes 0.61 (*)    All other components within normal limits  PROTIME-INR - Abnormal; Notable for the following components:   Prothrombin Time 15.4 (*)    All other components within normal limits  I-STAT CG4 LACTIC ACID, ED - Abnormal; Notable for the following components:   Lactic Acid, Venous 3.9 (*)    All other components within normal limits  RESP PANEL BY RT-PCR (RSV, FLU A&B, COVID)  RVPGX2  CULTURE, BLOOD (ROUTINE X 2)  CULTURE, BLOOD (ROUTINE X 2)  APTT  URINALYSIS, W/ REFLEX TO CULTURE (INFECTION SUSPECTED)  AMMONIA  I-STAT CG4 LACTIC ACID, ED   CT ABDOMEN PELVIS WO CONTRAST  Result Date: 06/08/2023 CLINICAL DATA:  89 year old female with history of sepsis. EXAM: CT ABDOMEN AND PELVIS WITHOUT CONTRAST TECHNIQUE: Multidetector CT imaging of the abdomen and pelvis was performed following the standard protocol without IV contrast. RADIATION DOSE REDUCTION: This exam was performed according to the departmental dose-optimization program which includes automated exposure control, adjustment of the mA and/or kV according to patient size and/or use of iterative reconstruction technique.  COMPARISON:  None Available. FINDINGS: Lower chest: Small bilateral pleural effusions. Dependent opacities in the lower lobes of the lungs bilaterally which likely reflect a combination of atelectasis and airspace consolidation. Hepatobiliary: No definite suspicious cystic or solid hepatic lesions are confidently identified on today's noncontrast CT examination. Gallbladder is moderately distended. There is some intermediate attenuation material lying dependently in the gallbladder, likely reflective of biliary sludge. No pericholecystic fluid or inflammatory changes confidently identified. Pancreas: No definite pancreatic mass or peripancreatic fluid collections or inflammatory changes noted on today's noncontrast CT examination. Spleen: Unremarkable. Adrenals/Urinary Tract: Mild bilateral renal atrophy. Unenhanced appearance of the kidneys and bilateral adrenal glands is otherwise unremarkable. No hydroureteronephrosis. Urinary bladder is nearly completely decompressed. There is a small amount of gas non dependently in the lumen of the urinary bladder. Additionally, there is a small amount of high attenuation material in the base of the bladder near the urethral orifice (axial image 79 of series 3) measuring 1.9 x 1.0 cm. Stomach/Bowel: The appearance of the stomach is unremarkable. No pathologic dilatation of small bowel or colon. Numerous colonic diverticuli are noted, without definite focal surrounding inflammatory changes to clearly indicate an acute diverticulitis at this time. The appendix is not confidently identified and may be surgically absent. Regardless, there are no inflammatory changes noted adjacent to the cecum to suggest the presence of an acute appendicitis at this time. Vascular/Lymphatic: Atherosclerosis in the abdominal aorta and pelvic vasculature. No lymphadenopathy noted in the abdomen or pelvis. Reproductive: Status post hysterectomy. Low-attenuation lesions associated with both ovaries,  largest of which on the right measures 3.6 x 2.6 cm (axial image 65 of series 3). Other: No significant volume of ascites.  No pneumoperitoneum. Musculoskeletal: In the inferior aspect of the right breast (axial image 30 of series 3) there is a 1.5 x 1.3 cm soft tissue attenuation nodule, nonspecific. There are no aggressive appearing lytic or blastic lesions noted in the visualized portions of the skeleton. Postoperative changes of ORIF are noted in the left proximal femur, incompletely imaged. IMPRESSION: 1. Gallbladder is moderately distended. There is some intermediate attenuation material lying dependently within the gallbladder. No overt pericholecystic fluid or surrounding inflammatory changes. If there is clinical concern for acute cholecystitis, further evaluation with right upper quadrant abdominal ultrasound should be considered. 2. Tiny amount of gas non dependently in the lumen of the urinary bladder. This could be iatrogenic if there has been recent catheterization for urinalysis. In the absence of a history of recent catheterization, correlation with urinalysis would be strongly recommended to exclude the possibility of urinary tract infection with gas-forming organisms. There is also a small amount of high attenuation material in the base of the urinary bladder adjacent to the urethral orifice. This is of uncertain etiology and significance. If there has been recent catheterization, this could represent a small amount of blood products. Alternatively,  a partially calcified bladder stone could have this appearance. 3. Bibasilar opacities in the lungs likely reflective of a combination of atelectasis and consolidation. Clinical correlation for signs and symptoms of recent aspiration is suggested. There also trace bilateral pleural effusions. 4. Colonic diverticulosis without evidence of acute diverticulitis at this time. 5. Simple appearing cyst in the right ovary, as above. Follow-up pelvic ultrasound  should be considered in 6-12 months to ensure stability. 6. Small soft tissue attenuation nodule in the inferior aspect of the right breast, nonspecific. Correlation with nonemergent outpatient mammography could be considered if clinically appropriate. Electronically Signed   By: Trudie Reed M.D.   On: 06/08/2023 06:36   CT Head Wo Contrast  Result Date: 06/08/2023 CLINICAL DATA:  Mental status change with unknown cause EXAM: CT HEAD WITHOUT CONTRAST TECHNIQUE: Contiguous axial images were obtained from the base of the skull through the vertex without intravenous contrast. RADIATION DOSE REDUCTION: This exam was performed according to the departmental dose-optimization program which includes automated exposure control, adjustment of the mA and/or kV according to patient size and/or use of iterative reconstruction technique. COMPARISON:  07/05/2019 FINDINGS: Brain: No evidence of acute infarction, hemorrhage, obstructive hydrocephalus, extra-axial collection or mass lesion/mass effect. Mental status change with brain atrophy which generalized ventriculomegaly. There is some degree of disproportionate subarachnoid spaces on coronal images but the overall pattern and stability is most consistent with atrophy. Vascular: No hyperdense vessel or unexpected calcification. Skull: Normal. Negative for fracture or focal lesion. Sinuses/Orbits: No acute finding. IMPRESSION: 1. No acute or interval finding. 2. Generalized atrophy. Electronically Signed   By: Tiburcio Pea M.D.   On: 06/08/2023 05:59   DG Chest Port 1 View  Result Date: 06/08/2023 CLINICAL DATA:  Possible sepsis. Elderly patient found at nursing home with altered mental status. EXAM: PORTABLE CHEST 1 VIEW COMPARISON:  Most recent prior chest x-ray 07/05/2019 FINDINGS: Limited frontal radiograph in expiratory phase. The lung volumes are very low. Patchy airspace opacities in the right perihilar region, left perihilar region and left lung base.  Background of chronic bronchitic changes. Cardiopericardial silhouette is not well seen due to the very low lung volumes. Chronic degenerative changes of the left shoulder joint. IMPRESSION: 1. Very limited frontal view of the chest in the expiratory phase of respiration resulting in very low lung volumes and limited utility. 2. Patchy airspace opacities in the bilateral hilar regions and left lower lobe may reflect areas of atelectasis or pneumonia. Recommend further evaluation with dedicated PA and lateral chest x-ray when the patient is able. Electronically Signed   By: Malachy Moan M.D.   On: 06/08/2023 05:25    ED Course / MDM   Clinical Course as of 06/08/23 0826  Fri Jun 08, 2023  0716 Septic shock, DNR/DNI/no pressers. Family wants abx and IVF. Aware this is likely terminal. Not a surgical candidate unclear source, possible gallbladder, PNA. Facility treating for UTI. No urine on bladder scan. Admit to medicine [BH]    Clinical Course User Index [BH] Yavier Snider A, PA-C   Care assumed from previous provider.  See note for full HPI.  In summation 89 year old here for evaluation of altered mental status and lethargy.  Noted yesterday she was confused, facility started on antibiotics for UTI.  Was found today more confused at 2 AM.  Had some blood in her mouth.  No witnessed seizure-like activity.  On baseline 2 L.  On arrival to the ED patient tachycardic.  Patient is DNR, DNI.  Patient  found to be in likely septic shock, possible cholecystitis versus pneumonia versus UTI.  Patient is not a surgical candidate.  Previous provider had long discussion with family.  Family is agreeable that patient is DNR, DNI, they do not want pressors.  They do want antibiotics and fluids.  Family aware that this is likely terminal.  Plan to admit to medicine  Patient only responsive to painful stimuli.  Continues to be hypotensive. 60/40's.  Patient reassessed.  Unresponsive.  Hypotensive.  Appears  comfortable.  Again discussed previous providers plan for comfort care with patient family in the room.  Patient's medical POA agreeable for comfort care.  Will discuss with hospitalist for admission. Abx ordered and given by previous provider, abx Completed prior to my assessment.  0820: Discussed with Dr. Avie Arenas with hospitalist who is agreeable to evaluate patient for admission     Medical Decision Making Amount and/or Complexity of Data Reviewed Labs: ordered. Radiology: ordered. ECG/medicine tests: ordered.  Risk Prescription drug management. Decision regarding hospitalization.   Septic shock       Aluna Whiston A, PA-C 06/08/23 1155    [place]ford Maus, [place] 06/08/23 1530

## 2023-06-09 DIAGNOSIS — J9601 Acute respiratory failure with hypoxia: Secondary | ICD-10-CM | POA: Diagnosis present

## 2023-06-09 DIAGNOSIS — Z7189 Other specified counseling: Secondary | ICD-10-CM | POA: Diagnosis not present

## 2023-06-09 DIAGNOSIS — N179 Acute kidney failure, unspecified: Secondary | ICD-10-CM | POA: Diagnosis present

## 2023-06-09 DIAGNOSIS — G9341 Metabolic encephalopathy: Secondary | ICD-10-CM | POA: Diagnosis present

## 2023-06-09 DIAGNOSIS — Z79899 Other long term (current) drug therapy: Secondary | ICD-10-CM | POA: Diagnosis not present

## 2023-06-09 DIAGNOSIS — I110 Hypertensive heart disease with heart failure: Secondary | ICD-10-CM | POA: Diagnosis present

## 2023-06-09 DIAGNOSIS — E872 Acidosis, unspecified: Secondary | ICD-10-CM | POA: Diagnosis present

## 2023-06-09 DIAGNOSIS — Z91013 Allergy to seafood: Secondary | ICD-10-CM | POA: Diagnosis not present

## 2023-06-09 DIAGNOSIS — Z66 Do not resuscitate: Secondary | ICD-10-CM | POA: Diagnosis present

## 2023-06-09 DIAGNOSIS — Z880 Allergy status to penicillin: Secondary | ICD-10-CM | POA: Diagnosis not present

## 2023-06-09 DIAGNOSIS — E785 Hyperlipidemia, unspecified: Secondary | ICD-10-CM | POA: Diagnosis present

## 2023-06-09 DIAGNOSIS — R6521 Severe sepsis with septic shock: Secondary | ICD-10-CM | POA: Diagnosis not present

## 2023-06-09 DIAGNOSIS — K828 Other specified diseases of gallbladder: Secondary | ICD-10-CM | POA: Diagnosis present

## 2023-06-09 DIAGNOSIS — B961 Klebsiella pneumoniae [K. pneumoniae] as the cause of diseases classified elsewhere: Secondary | ICD-10-CM | POA: Diagnosis present

## 2023-06-09 DIAGNOSIS — Z7982 Long term (current) use of aspirin: Secondary | ICD-10-CM | POA: Diagnosis not present

## 2023-06-09 DIAGNOSIS — R7989 Other specified abnormal findings of blood chemistry: Secondary | ICD-10-CM | POA: Diagnosis present

## 2023-06-09 DIAGNOSIS — A419 Sepsis, unspecified organism: Secondary | ICD-10-CM | POA: Diagnosis present

## 2023-06-09 DIAGNOSIS — Z1152 Encounter for screening for COVID-19: Secondary | ICD-10-CM | POA: Diagnosis not present

## 2023-06-09 DIAGNOSIS — G4700 Insomnia, unspecified: Secondary | ICD-10-CM | POA: Diagnosis present

## 2023-06-09 DIAGNOSIS — I5032 Chronic diastolic (congestive) heart failure: Secondary | ICD-10-CM | POA: Diagnosis present

## 2023-06-09 DIAGNOSIS — Z515 Encounter for palliative care: Secondary | ICD-10-CM | POA: Diagnosis not present

## 2023-06-09 DIAGNOSIS — R6520 Severe sepsis without septic shock: Secondary | ICD-10-CM | POA: Diagnosis present

## 2023-06-09 DIAGNOSIS — N390 Urinary tract infection, site not specified: Secondary | ICD-10-CM | POA: Diagnosis present

## 2023-06-09 NOTE — Plan of Care (Signed)
UTA patient level of orientation. Patient showed signs of increased level of pain with facial grimacing, morphine administered as needed. Family members at bedside and encouraged to wear proper PPE while in room with patient. Comfort measures continued.   Problem: Education: Goal: Knowledge of General Education information will improve Description: Including pain rating scale, medication(s)/side effects and non-pharmacologic comfort measures Outcome: Progressing   Problem: Health Behavior/Discharge Planning: Goal: Ability to manage health-related needs will improve Outcome: Progressing   Problem: Clinical Measurements: Goal: Ability to maintain clinical measurements within normal limits will improve Outcome: Progressing   Problem: Clinical Measurements: Goal: Will remain free from infection Outcome: Progressing   Problem: Clinical Measurements: Goal: Diagnostic test results will improve Outcome: Progressing   Problem: Clinical Measurements: Goal: Respiratory complications will improve Outcome: Progressing   Problem: Clinical Measurements: Goal: Cardiovascular complication will be avoided Outcome: Progressing   Problem: Activity: Goal: Risk for activity intolerance will decrease Outcome: Progressing   Problem: Nutrition: Goal: Adequate nutrition will be maintained Outcome: Progressing   Problem: Coping: Goal: Level of anxiety will decrease Outcome: Progressing   Problem: Elimination: Goal: Will not experience complications related to bowel motility Outcome: Progressing   Problem: Elimination: Goal: Will not experience complications related to urinary retention Outcome: Progressing   Problem: Pain Managment: Goal: General experience of comfort will improve Outcome: Progressing   Problem: Safety: Goal: Ability to remain free from injury will improve Outcome: Progressing   Problem: Skin Integrity: Goal: Risk for impaired skin integrity will decrease Outcome:  Progressing   Problem: Education: Goal: Knowledge of the prescribed therapeutic regimen will improve Outcome: Progressing   Problem: Coping: Goal: Ability to identify and develop effective coping behavior will improve Outcome: Progressing   Problem: Clinical Measurements: Goal: Quality of life will improve Outcome: Progressing   Problem: Respiratory: Goal: Verbalizations of increased ease of respirations will increase Outcome: Progressing   Problem: Role Relationship: Goal: Family's ability to cope with current situation will improve Outcome: Progressing   Problem: Role Relationship: Goal: Ability to verbalize concerns, feelings, and thoughts to partner or family member will improve Outcome: Progressing   Problem: Pain Management: Goal: Satisfaction with pain management regimen will improve Outcome: Progressing

## 2023-06-09 NOTE — Progress Notes (Signed)
PROGRESS NOTE  Lauren Moreno  ZOX:[id number] DOB: 02/07/1928 DOA: 06/08/2023 PCP: Pcp, No   Brief Narrative: Patient is a 89 year old female with history of hypertension, hyperlipidemia,CHF  who was brought from [facility] nursing facility with complaint of altered mental status.  She was also found to have hematemesis, hypoxic.  On presentation she was hypotensive with systolic blood pressure in the range of 50s.  Lab work showed AKI, elevated liver enzymes, WBC count of 27.1, lactate of 3.9.  CT abdomen/pelvis showed distended bladder, chest x-ray showed airspace opacities.  Family did not want to pursue aggressive treatment course and opted for comfort care. Currently on  full comfort care.  Anticipated hospital death.  Palliative care consulted  Assessment & Plan:  Principal Problem:   Sepsis (HCC)  Severe sepsis: Presented with altered mentation, severe hypotension.  Elevated lactate and leukocytes.  Source of sepsis could be pneumonia versus UTI versus intra-abdominal.  Found to have gallbladder distention.  Currently on full comfort care.  Other pertinent past medical problems: CHF.  On 2 L of oxygen chronically at nursing facility   Goals of care:Continue comfort medications.  Currently on morphine 2 to 4 mg as needed.  Will also request for palliative care consultation.This morning she is awake, does not appear to be in distress.  Son says if he prefers in-house hospice         DVT prophylaxis:SCDs Start: 06/08/23 0818     Code Status: Do not attempt resuscitation (DNR) - Comfort care  Family Communication: Discussed with son at bedside on 9/7  Patient status:Inpatient  Patient is from :SNF  Anticipated discharge WJ:[id number] hospital death  Estimated DC date:anticipated hospital death   Consultants: Palliative care  Procedures:None  Antimicrobials:  Anti-infectives (From admission, onward)    Start     Dose/Rate Route Frequency Ordered Stop   06/08/23 0645   vancomycin (VANCOREADY) IVPB 1500 mg/300 mL        1,500 mg 150 mL/hr over 120 Minutes Intravenous  Once 06/08/23 0642 06/08/23 1131   06/08/23 0545  cefTRIAXone (ROCEPHIN) 2 g in sodium chloride 0.9 % 100 mL IVPB        2 g 200 mL/hr over 30 Minutes Intravenous  Once 06/08/23 0532 06/08/23 0725   06/08/23 0530  aztreonam (AZACTAM) injection 1 g  Status:  Discontinued        1 g Intramuscular Once 06/08/23 0526 06/08/23 0532       Subjective: Patient seen and examined at bedside today.  During my evaluation, she was lying in bed, appeared comfortable.  Son was at bedside.  When we talked, she opened her eyes and looked around.  She does not seem to be in distress.  But she is awake.  Long discussion had with the son at bedside.  He prefers in-house hospice  Objective: Vitals:   06/08/23 0920 06/08/23 2026 06/09/23 0511 06/09/23 0747  BP: (!) 120/27 (!) 107/55 (!) 102/44 (!) 131/55  Pulse: 94 (!) 115 75 (!) 115  Resp: 16 20 19 16   Temp: 97.8 F (36.6 C) 99.2 F (37.3 C) 98.5 F (36.9 C) 98.8 F (37.1 C)  TempSrc: Oral Oral Oral Oral  SpO2: 98% (!) 85% 91% 96%    Intake/Output Summary (Last 24 hours) at 06/09/2023 0803 Last data filed at 06/09/2023 1308 Gross per 24 hour  Intake 5.58 ml  Output --  Net 5.58 ml   There were no vitals filed for this visit.  Examination:  General exam: Overall  comfortable, not in distress, very deconditioned, chronically looking Respiratory system:  no wheezes or crackles, diminished sounds bilaterally Cardiovascular system: S1 & S2 heard, RRR.  Gastrointestinal system: Abdomen is nondistended, soft and nontender. Central nervous system: Awake but not oriented Extremities: Bilateral lower extremity pitting edema, no clubbing ,no cyanosis Skin: No rashes, no ulcers,no icterus     Data Reviewed: I have personally reviewed following labs and imaging studies  CBC: Recent Labs  Lab 06/08/23 0502  WBC 27.1*  NEUTROABS 24.6*  HGB 13.3  HCT  41.2  MCV 103.5*  PLT 312   Basic Metabolic Panel: Recent Labs  Lab 06/08/23 0502  NA 138  K 3.5  CL 101  CO2 24  GLUCOSE 86  BUN 16  CREATININE 1.53*  CALCIUM 8.3*     Recent Results (from the past 240 hour(s))  Blood Culture (routine x 2)     Status: None (Preliminary result)   Collection Time: 06/08/23  5:00 AM   Specimen: BLOOD RIGHT HAND  Result Value Ref Range Status   Specimen Description BLOOD RIGHT HAND  Final   Special Requests   Final    BOTTLES DRAWN AEROBIC AND ANAEROBIC Blood Culture adequate volume   Culture  Setup Time   Final    GRAM NEGATIVE RODS IN BOTH AEROBIC AND ANAEROBIC BOTTLES CRITICAL VALUE NOTED.  VALUE IS CONSISTENT WITH PREVIOUSLY REPORTED AND CALLED VALUE. Performed at [facility], [address], [place], [place] [address]    Culture GRAM NEGATIVE RODS  Final   Report Status PENDING  Incomplete  Resp panel by RT-PCR (RSV, Flu A&B, Covid) Anterior Nasal Swab     Status: None   Collection Time: 06/08/23  5:01 AM   Specimen: Anterior Nasal Swab  Result Value Ref Range Status   SARS Coronavirus 2 by RT PCR NEGATIVE NEGATIVE Final   Influenza A by PCR NEGATIVE NEGATIVE Final   Influenza B by PCR NEGATIVE NEGATIVE Final    Comment: (NOTE) The Xpert Xpress SARS-CoV-2/FLU/RSV plus assay is intended as an aid in the diagnosis of influenza from Nasopharyngeal swab specimens and should not be used as a sole basis for treatment. Nasal washings and aspirates are unacceptable for Xpert Xpress SARS-CoV-2/FLU/RSV testing.  Fact Sheet for Patients: BloggerCourse.com  Fact Sheet for Healthcare Providers: SeriousBroker.it  This test is not yet approved or cleared by the [place] FDA and has been authorized for detection and/or diagnosis of SARS-CoV-2 by FDA under an Emergency Use Authorization (EUA). This EUA will remain in effect (meaning this test can be used) for the duration of  the COVID-19 declaration under Section 564(b)(1) of the Act, 21 U.S.C. section 360bbb-3(b)(1), unless the authorization is terminated or revoked.     Resp Syncytial Virus by PCR NEGATIVE NEGATIVE Final    Comment: (NOTE) Fact Sheet for Patients: BloggerCourse.com  Fact Sheet for Healthcare Providers: SeriousBroker.it  This test is not yet approved or cleared by the [place] FDA and has been authorized for detection and/or diagnosis of SARS-CoV-2 by FDA under an Emergency Use Authorization (EUA). This EUA will remain in effect (meaning this test can be used) for the duration of the COVID-19 declaration under Section 564(b)(1) of the Act, 21 U.S.C. section 360bbb-3(b)(1), unless the authorization is terminated or revoked.  Performed at [facility], [address], [place], [place] [address]   Blood Culture (routine x 2)     Status: None (Preliminary result)   Collection Time: 06/08/23  5:29 AM  Specimen: BLOOD  Result Value Ref Range Status   Specimen Description BLOOD BLOOD RIGHT HAND  Final   Special Requests   Final    BOTTLES DRAWN AEROBIC AND ANAEROBIC Blood Culture adequate volume   Culture  Setup Time   Final    GRAM NEGATIVE RODS IN BOTH AEROBIC AND ANAEROBIC BOTTLES CRITICAL RESULT CALLED TO, READ BACK BY AND VERIFIED WITH: PHARMD AUSTIN PAYTES ON 06/08/23 @ 1952 BY DRT Performed at [facility], [address], [place], [place] [address]    Culture GRAM NEGATIVE RODS  Final   Report Status PENDING  Incomplete  Blood Culture ID Panel (Reflexed)     Status: Abnormal   Collection Time: 06/08/23  5:29 AM  Result Value Ref Range Status   Enterococcus faecalis NOT DETECTED NOT DETECTED Final   Enterococcus Faecium NOT DETECTED NOT DETECTED Final   Listeria monocytogenes NOT DETECTED NOT DETECTED Final   Staphylococcus species DETECTED (A) NOT DETECTED Final    Comment: CRITICAL RESULT CALLED TO, READ  BACK BY AND VERIFIED WITH: PHARMD AUSTIN PAYTES ON 06/08/23 @ 1952 BY DRT    Staphylococcus aureus (BCID) NOT DETECTED NOT DETECTED Final   Staphylococcus epidermidis NOT DETECTED NOT DETECTED Final   Staphylococcus lugdunensis NOT DETECTED NOT DETECTED Final   Streptococcus species NOT DETECTED NOT DETECTED Final   Streptococcus agalactiae NOT DETECTED NOT DETECTED Final   Streptococcus pneumoniae NOT DETECTED NOT DETECTED Final   Streptococcus pyogenes NOT DETECTED NOT DETECTED Final   A.calcoaceticus-baumannii NOT DETECTED NOT DETECTED Final   Bacteroides fragilis NOT DETECTED NOT DETECTED Final   Enterobacterales DETECTED (A) NOT DETECTED Final    Comment: Enterobacterales represent a large order of gram negative bacteria, not a single organism. CRITICAL RESULT CALLED TO, READ BACK BY AND VERIFIED WITH: PHARMD AUSTIN PAYTES ON 06/08/23 @ 1952 BY DRT    Enterobacter cloacae complex NOT DETECTED NOT DETECTED Final   Escherichia coli NOT DETECTED NOT DETECTED Final   Klebsiella aerogenes NOT DETECTED NOT DETECTED Final   Klebsiella oxytoca NOT DETECTED NOT DETECTED Final   Klebsiella pneumoniae DETECTED (A) NOT DETECTED Final    Comment: CRITICAL RESULT CALLED TO, READ BACK BY AND VERIFIED WITH: PHARMD AUSTIN PAYTES ON 06/08/23 @ 1952 BY DRT    Proteus species NOT DETECTED NOT DETECTED Final   Salmonella species NOT DETECTED NOT DETECTED Final   Serratia marcescens NOT DETECTED NOT DETECTED Final   Haemophilus influenzae NOT DETECTED NOT DETECTED Final   Neisseria meningitidis NOT DETECTED NOT DETECTED Final   Pseudomonas aeruginosa NOT DETECTED NOT DETECTED Final   Stenotrophomonas maltophilia NOT DETECTED NOT DETECTED Final   Candida albicans NOT DETECTED NOT DETECTED Final   Candida auris NOT DETECTED NOT DETECTED Final   Candida glabrata NOT DETECTED NOT DETECTED Final   Candida krusei NOT DETECTED NOT DETECTED Final   Candida parapsilosis NOT DETECTED NOT DETECTED Final   Candida  tropicalis NOT DETECTED NOT DETECTED Final   Cryptococcus neoformans/gattii NOT DETECTED NOT DETECTED Final   CTX-M ESBL NOT DETECTED NOT DETECTED Final   Carbapenem resistance IMP NOT DETECTED NOT DETECTED Final   Carbapenem resistance KPC NOT DETECTED NOT DETECTED Final   Carbapenem resistance NDM NOT DETECTED NOT DETECTED Final   Carbapenem resist OXA 48 LIKE NOT DETECTED NOT DETECTED Final   Carbapenem resistance VIM NOT DETECTED NOT DETECTED Final    Comment: Performed at [facility], [address], [place], [place] [address]     Radiology Studies:  CT ABDOMEN PELVIS WO CONTRAST  Result Date: 06/08/2023 CLINICAL DATA:  89 year old female with history of sepsis. EXAM: CT ABDOMEN AND PELVIS WITHOUT CONTRAST TECHNIQUE: Multidetector CT imaging of the abdomen and pelvis was performed following the standard protocol without IV contrast. RADIATION DOSE REDUCTION: This exam was performed according to the departmental dose-optimization program which includes automated exposure control, adjustment of the mA and/or kV according to patient size and/or use of iterative reconstruction technique. COMPARISON:  None Available. FINDINGS: Lower chest: Small bilateral pleural effusions. Dependent opacities in the lower lobes of the lungs bilaterally which likely reflect a combination of atelectasis and airspace consolidation. Hepatobiliary: No definite suspicious cystic or solid hepatic lesions are confidently identified on today's noncontrast CT examination. Gallbladder is moderately distended. There is some intermediate attenuation material lying dependently in the gallbladder, likely reflective of biliary sludge. No pericholecystic fluid or inflammatory changes confidently identified. Pancreas: No definite pancreatic mass or peripancreatic fluid collections or inflammatory changes noted on today's noncontrast CT examination. Spleen: Unremarkable. Adrenals/Urinary Tract: Mild bilateral renal atrophy.  Unenhanced appearance of the kidneys and bilateral adrenal glands is otherwise unremarkable. No hydroureteronephrosis. Urinary bladder is nearly completely decompressed. There is a small amount of gas non dependently in the lumen of the urinary bladder. Additionally, there is a small amount of high attenuation material in the base of the bladder near the urethral orifice (axial image 79 of series 3) measuring 1.9 x 1.0 cm. Stomach/Bowel: The appearance of the stomach is unremarkable. No pathologic dilatation of small bowel or colon. Numerous colonic diverticuli are noted, without definite focal surrounding inflammatory changes to clearly indicate an acute diverticulitis at this time. The appendix is not confidently identified and may be surgically absent. Regardless, there are no inflammatory changes noted adjacent to the cecum to suggest the presence of an acute appendicitis at this time. Vascular/Lymphatic: Atherosclerosis in the abdominal aorta and pelvic vasculature. No lymphadenopathy noted in the abdomen or pelvis. Reproductive: Status post hysterectomy. Low-attenuation lesions associated with both ovaries, largest of which on the right measures 3.6 x 2.6 cm (axial image 65 of series 3). Other: No significant volume of ascites.  No pneumoperitoneum. Musculoskeletal: In the inferior aspect of the right breast (axial image 30 of series 3) there is a 1.5 x 1.3 cm soft tissue attenuation nodule, nonspecific. There are no aggressive appearing lytic or blastic lesions noted in the visualized portions of the skeleton. Postoperative changes of ORIF are noted in the left proximal femur, incompletely imaged. IMPRESSION: 1. Gallbladder is moderately distended. There is some intermediate attenuation material lying dependently within the gallbladder. No overt pericholecystic fluid or surrounding inflammatory changes. If there is clinical concern for acute cholecystitis, further evaluation with right upper quadrant  abdominal ultrasound should be considered. 2. Tiny amount of gas non dependently in the lumen of the urinary bladder. This could be iatrogenic if there has been recent catheterization for urinalysis. In the absence of a history of recent catheterization, correlation with urinalysis would be strongly recommended to exclude the possibility of urinary tract infection with gas-forming organisms. There is also a small amount of high attenuation material in the base of the urinary bladder adjacent to the urethral orifice. This is of uncertain etiology and significance. If there has been recent catheterization, this could represent a small amount of blood products. Alternatively, a partially calcified bladder stone could have this appearance. 3. Bibasilar opacities in the lungs likely reflective of a combination of atelectasis and consolidation. Clinical correlation for signs and symptoms of  recent aspiration is suggested. There also trace bilateral pleural effusions. 4. Colonic diverticulosis without evidence of acute diverticulitis at this time. 5. Simple appearing cyst in the right ovary, as above. Follow-up pelvic ultrasound should be considered in 6-12 months to ensure stability. 6. Small soft tissue attenuation nodule in the inferior aspect of the right breast, nonspecific. Correlation with nonemergent outpatient mammography could be considered if clinically appropriate. Electronically Signed   By: Trudie Reed M.D.   On: 06/08/2023 06:36   CT Head Wo Contrast  Result Date: 06/08/2023 CLINICAL DATA:  Mental status change with unknown cause EXAM: CT HEAD WITHOUT CONTRAST TECHNIQUE: Contiguous axial images were obtained from the base of the skull through the vertex without intravenous contrast. RADIATION DOSE REDUCTION: This exam was performed according to the departmental dose-optimization program which includes automated exposure control, adjustment of the mA and/or kV according to patient size and/or use of  iterative reconstruction technique. COMPARISON:  07/05/2019 FINDINGS: Brain: No evidence of acute infarction, hemorrhage, obstructive hydrocephalus, extra-axial collection or mass lesion/mass effect. Mental status change with brain atrophy which generalized ventriculomegaly. There is some degree of disproportionate subarachnoid spaces on coronal images but the overall pattern and stability is most consistent with atrophy. Vascular: No hyperdense vessel or unexpected calcification. Skull: Normal. Negative for fracture or focal lesion. Sinuses/Orbits: No acute finding. IMPRESSION: 1. No acute or interval finding. 2. Generalized atrophy. Electronically Signed   By: Tiburcio Pea M.D.   On: 06/08/2023 05:59   DG Chest Port 1 View  Result Date: 06/08/2023 CLINICAL DATA:  Possible sepsis. Elderly patient found at nursing home with altered mental status. EXAM: PORTABLE CHEST 1 VIEW COMPARISON:  Most recent prior chest x-ray 07/05/2019 FINDINGS: Limited frontal radiograph in expiratory phase. The lung volumes are very low. Patchy airspace opacities in the right perihilar region, left perihilar region and left lung base. Background of chronic bronchitic changes. Cardiopericardial silhouette is not well seen due to the very low lung volumes. Chronic degenerative changes of the left shoulder joint. IMPRESSION: 1. Very limited frontal view of the chest in the expiratory phase of respiration resulting in very low lung volumes and limited utility. 2. Patchy airspace opacities in the bilateral hilar regions and left lower lobe may reflect areas of atelectasis or pneumonia. Recommend further evaluation with dedicated PA and lateral chest x-ray when the patient is able. Electronically Signed   By: Malachy Moan M.D.   On: 06/08/2023 05:25    Scheduled Meds:  QUEtiapine  25 mg Oral QHS   torsemide  40 mg Oral Daily   Continuous Infusions:  sodium chloride Stopped (06/08/23 1127)     LOS: 0 days   Burnadette Pop,  MD [facility] P9/04/2023, 8:03 AM

## 2023-06-10 DIAGNOSIS — Z515 Encounter for palliative care: Secondary | ICD-10-CM

## 2023-06-10 DIAGNOSIS — Z7189 Other specified counseling: Secondary | ICD-10-CM | POA: Diagnosis not present

## 2023-06-10 DIAGNOSIS — A419 Sepsis, unspecified organism: Secondary | ICD-10-CM | POA: Diagnosis not present

## 2023-06-10 DIAGNOSIS — R6521 Severe sepsis with septic shock: Secondary | ICD-10-CM | POA: Diagnosis not present

## 2023-06-10 DIAGNOSIS — G9341 Metabolic encephalopathy: Secondary | ICD-10-CM | POA: Diagnosis not present

## 2023-06-10 LAB — CULTURE, BLOOD (ROUTINE X 2)
Special Requests: ADEQUATE
Special Requests: ADEQUATE

## 2023-06-10 MED ORDER — MORPHINE SULFATE (PF) 2 MG/ML IV SOLN
2.0000 mg | Freq: Three times a day (TID) | INTRAVENOUS | Status: DC
  Administered 2023-06-10 – 2023-06-11 (×3): 2 mg via INTRAVENOUS
  Filled 2023-06-10 (×3): qty 1

## 2023-06-10 NOTE — Consult Note (Addendum)
Palliative Medicine Inpatient Consult Note  Consulting Provider:  Burnadette Pop, MD   Reason for consult:   Palliative Care Consult Services Palliative Medicine Consult  Reason for Consult? Elderly patient presented with severe sepsis.  Family opting for comfort care.  Help appreciated   06/10/2023  HPI:  Per intake H&P --> Patient is a 89 year old female with history of hypertension, hyperlipidemia,CHF  who was brought from [facility] nursing facility with complaint of altered mental status.  She was also found to have hematemesis, hypoxic. Family did not want to pursue aggressive treatment course and opted for comfort care. Currently on  full comfort care.  Anticipated hospital death.  Palliative care consulted to support symptom management.    Clinical Assessment/Goals of Care:  *Please note that this is a verbal dictation therefore any spelling or grammatical errors are due to the "Dragon Medical One" system interpretation.  I have reviewed medical records including EPIC notes, labs and imaging, received report from bedside RN, assessed the patient who is lying in bed and has some complaints of left lower extremity pain.    I met with patients son, Lauren Moreno and granddaughter, Lauren Moreno to further discuss diagnosis prognosis, GOC, EOL wishes, disposition and options.   I introduced Palliative Medicine as specialized medical care for people living with serious illness. It focuses on providing relief from the symptoms and stress of a serious illness. The goal is to improve quality of life for both the patient and the family.  Medical History Review and Understanding:  I reviewed Lauren Moreno's past medical history inclusive of heart failure, hypertension, hyperlipidemia, left hip fracture, bilateral knee fracture, and thyroid disease.  Social History:  [facility].  She was married though her spouse passed away 8 years ago after her stroke.  She  has 3 children.  She has 10 grandchildren, 11 great-grandchildren, and 3 great great grandchildren.  She was a Hydrologist.  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  There is a strong Investment banker, operational.  Functional and Nutritional State:  Since January of this year after having fallen and fracturing both knees she has been predominantly bedbound.  Prior to that she was getting up in the wheelchair regularly.  Advance Directives:  A detailed discussion was had today regarding advanced directives.  There are no advanced directives on file though Lauren Moreno's children make decisions together in regards to her care.  Code Status:  Concepts specific to code status, artifical feeding and hydration, continued IV antibiotics and rehospitalization was had.  The difference between a aggressive medical intervention path  and a palliative comfort care path for this patient at this time was had.   Lauren Moreno is an established DO NOT RESUSCITATE DO NOT INTUBATE CODE STATUS.  Discussion:  Patient's son had to excuse himself to go to church though 2 additional grandchildren and patient's daughter Lauren Moreno came for further conversation.  We reviewed that Lauren Moreno came into the hospital on Friday and severe sepsis.  Her family endorse that it was not clear she was going to survive though she had gotten fluid resuscitation as well as some doses of antibiosis.  Many family members came to say their goodbyes as it was clear additional heroic measures would not align with her goals of care therefore her family opted to pursue keeping her comfortable.  Lauren Moreno has remained interactive over the past 2 days though she was more somnolent yesterday as compared to the day prior.  We discussed  the trajectory of decline in the setting of severe sepsis as well as organ dysfunction and organ failure.  We talked about transition to comfort measures in house and what that would entail inclusive of medications to control pain, dyspnea,  agitation, nausea, itching, and hiccups.   We discussed stopping all uneccessary measures such as cardiac monitoring, blood draws, needle sticks, and frequent vital signs.   Utilized reflective listening throughout our time together.  Patient's family is hopeful that Lauren Moreno can stay in the room she is in and in the hospital during this time.  We discussed the importance of comprehensive symptom management family is agreeable to allowing me to make morphine around-the-clock given Lauren Moreno's underlying chronic pain and visible signs of discomfort.  We will continue comfort oriented care -if patient remains stable we will continue to discuss transition to inpatient hospice.  Decision Maker: Lauren Moreno, Lauren Moreno (Son): [phone] (Home Phone)   SUMMARY OF RECOMMENDATIONS   DNAR/DNI  Comfort focused care  Initiate morphine 2 mg every 8 hours around-the-clock in addition to PRNs  Additional medications per [facility]  Patient at this time appears stable we will further broached inpatient hospice transfer conversations tomorrow  Ongoing palliative care support  Code Status/Advance Care Planning: DNAR/DNI   Palliative Prophylaxis:  Aspiration, Bowel Regimen, Delirium Protocol, Frequent Pain Assessment, Oral Care, Palliative Wound Care, and Turn Reposition  Additional Recommendations (Limitations, Scope, Preferences): Continue current care  Psycho-social/Spiritual:  Desire for further Chaplaincy support: Patient has a strong [place] faith Additional Recommendations: Education on severe sepsis and organ failure   Prognosis: Limited days to weeks  Discharge Planning: Discharge plan is undetermined.  Vitals:   06/09/23 0511 06/09/23 0747  BP: (!) 102/44 (!) 131/55  Pulse: 75 (!) 115  Resp: 19 16  Temp: 98.5 F (36.9 C) 98.8 F (37.1 C)  SpO2: 91% 96%   Gen: Elderly Caucasian female in no acute distress HEENT: Dry  mucous membranes CV: Regular rate and rhythm  PULM: On 2 L nasal cannula  breathing is even and nonlabored ABD: soft/nontender  EXT: Bilateral lower extremity edema Neuro: Alert and oriented x1  PPS: 10%   This conversation/these recommendations were discussed with patient primary care team, Dr. Renford Dills   Billing based on MDM: HIgh Total Time 82  Problems Addressed: One acute or chronic illness or injury that poses a threat to life or bodily function  Amount and/or Complexity of Data: Category 3:Discussion of management or test interpretation with external physician/other qualified health care professional/appropriate source (not separately reported)  Risks: Decision regarding hospitalization or escalation of hospital care and Decision not to resuscitate or to de-escalate care because of poor prognosis ______________________________________________________ Lauren Moreno [facility] Palliative Medicine Team Team Cell Phone: [phone] Please utilize secure chat with additional questions, if there is no response within 30 minutes please call the above phone number  Palliative Medicine Team providers are available by phone from 7am to 7pm daily and can be reached through the team cell phone.  Should this patient require assistance outside of these hours, please call the patient's attending physician.

## 2023-06-10 NOTE — Progress Notes (Signed)
PROGRESS NOTE  Lauren Moreno  BMW:[id number] DOB: January 08, 1928 DOA: 06/08/2023 PCP: Pcp, No   Brief Narrative: Patient is a 89 year old female with history of hypertension, hyperlipidemia,CHF  who was brought from [facility] nursing facility with complaint of altered mental status.  She was also reported to have hematemesis, was hypoxic.  On presentation she was hypotensive with systolic blood pressure in the range of 50s.  Lab work showed AKI, elevated liver enzymes, WBC count of 27.1, lactate of 3.9.  CT abdomen/pelvis showed distended bladder, chest x-ray showed airspace opacities.  Family did not want to pursue aggressive treatment course and opted for comfort care. Currently on  full comfort care.  Anticipated hospital death.  Palliative care consulted.  Palliative care discussing  with family about possible discharge to residential hospice but family wants in house hospice care at hospital  Assessment & Plan:  Principal Problem:   Sepsis (HCC)  Severe sepsis: Presented with altered mentation, severe hypotension.  Elevated lactate and leukocytes.  Source of sepsis could be pneumonia versus UTI versus intra-abdominal.  Found to have gallbladder distention.  Currently on full comfort care.  Other pertinent past medical problems: CHF.  On 2 L of oxygen chronically at nursing facility  Goals of care:Continue comfort medications.  Currently on morphine 2 to 4 mg as needed. This morning she was awake, does not appear to be in distress.  Family  prefers in-house hospice.  Palliative care closely following         DVT prophylaxis:SCDs Start: 06/08/23 0818     Code Status: Do not attempt resuscitation (DNR) - Comfort care  Family Communication: Discussed with daughter at bedside on 9/8  Patient status:Inpatient  Patient is from :SNF  Anticipated discharge UV:[id number] hospital death versus residential hospice    Consultants: Palliative care  Procedures:None  Antimicrobials:   Anti-infectives (From admission, onward)    Start     Dose/Rate Route Frequency Ordered Stop   06/08/23 0645  vancomycin (VANCOREADY) IVPB 1500 mg/300 mL        1,500 mg 150 mL/hr over 120 Minutes Intravenous  Once 06/08/23 0642 06/08/23 1131   06/08/23 0545  cefTRIAXone (ROCEPHIN) 2 g in sodium chloride 0.9 % 100 mL IVPB        2 g 200 mL/hr over 30 Minutes Intravenous  Once 06/08/23 0532 06/08/23 0725   06/08/23 0530  aztreonam (AZACTAM) injection 1 g  Status:  Discontinued        1 g Intramuscular Once 06/08/23 0526 06/08/23 0532       Subjective: Patient seen and examined at bedside today.  She was lying in bed, eyes were closed.  Family at bedside.  She does not look in any distress.  During our conversation, patient open her eyes and tried to speak.  Daughter again expressed her interest to continue in-house hospice care  Objective: Vitals:   06/08/23 2026 06/09/23 0511 06/09/23 0747 06/10/23 0721  BP: (!) 107/55 (!) 102/44 (!) 131/55 (!) 99/48  Pulse: (!) 115 75 (!) 115 (!) 102  Resp: 20 19 16    Temp: 99.2 F (37.3 C) 98.5 F (36.9 C) 98.8 F (37.1 C) 97.8 F (36.6 C)  TempSrc: Oral Oral Oral Oral  SpO2: (!) 85% 91% 96% 90%    Intake/Output Summary (Last 24 hours) at 06/10/2023 1400 Last data filed at 06/10/2023 1100 Gross per 24 hour  Intake 333.87 ml  Output --  Net 333.87 ml   There were no vitals filed for this visit.  Examination:  General exam: Weak, chronically ill looking, lying on bed  Respiratory system:  no wheezes or crackles, diminished sounds bilaterally Cardiovascular system: S1 & S2 heard, RRR.  Gastrointestinal system: Abdomen is nondistended, soft Central nervous system: But not oriented  extremities: Bilateral lower extremity pitting edema, no clubbing ,no cyanosis Skin: No rashes, no ulcers,no icterus     Data Reviewed: I have personally reviewed following labs and imaging studies  CBC: Recent Labs  Lab 06/08/23 0502  WBC 27.1*   NEUTROABS 24.6*  HGB 13.3  HCT 41.2  MCV 103.5*  PLT 312   Basic Metabolic Panel: Recent Labs  Lab 06/08/23 0502  NA 138  K 3.5  CL 101  CO2 24  GLUCOSE 86  BUN 16  CREATININE 1.53*  CALCIUM 8.3*     Recent Results (from the past 240 hour(s))  Blood Culture (routine x 2)     Status: Abnormal   Collection Time: 06/08/23  5:00 AM   Specimen: BLOOD RIGHT HAND  Result Value Ref Range Status   Specimen Description BLOOD RIGHT HAND  Final   Special Requests   Final    BOTTLES DRAWN AEROBIC AND ANAEROBIC Blood Culture adequate volume   Culture  Setup Time   Final    GRAM NEGATIVE RODS IN BOTH AEROBIC AND ANAEROBIC BOTTLES CRITICAL VALUE NOTED.  VALUE IS CONSISTENT WITH PREVIOUSLY REPORTED AND CALLED VALUE.    Culture (A)  Final    KLEBSIELLA PNEUMONIAE SUSCEPTIBILITIES PERFORMED ON PREVIOUS CULTURE WITHIN THE LAST 5 DAYS. Performed at [facility], [address] [place], [place] [address]    Report Status 06/10/2023 FINAL  Final  Resp panel by RT-PCR (RSV, Flu A&B, Covid) Anterior Nasal Swab     Status: None   Collection Time: 06/08/23  5:01 AM   Specimen: Anterior Nasal Swab  Result Value Ref Range Status   SARS Coronavirus 2 by RT PCR NEGATIVE NEGATIVE Final   Influenza A by PCR NEGATIVE NEGATIVE Final   Influenza B by PCR NEGATIVE NEGATIVE Final    Comment: (NOTE) The Xpert Xpress SARS-CoV-2/FLU/RSV plus assay is intended as an aid in the diagnosis of influenza from Nasopharyngeal swab specimens and should not be used as a sole basis for treatment. Nasal washings and aspirates are unacceptable for Xpert Xpress SARS-CoV-2/FLU/RSV testing.  Fact Sheet for Patients: BloggerCourse.com  Fact Sheet for Healthcare Providers: SeriousBroker.it  This test is not yet approved or cleared by the [place] FDA and has been authorized for detection and/or diagnosis of SARS-CoV-2 by FDA under an Emergency Use  Authorization (EUA). This EUA will remain in effect (meaning this test can be used) for the duration of the COVID-19 declaration under Section 564(b)(1) of the Act, 21 U.S.C. section 360bbb-3(b)(1), unless the authorization is terminated or revoked.     Resp Syncytial Virus by PCR NEGATIVE NEGATIVE Final    Comment: (NOTE) Fact Sheet for Patients: BloggerCourse.com  Fact Sheet for Healthcare Providers: SeriousBroker.it  This test is not yet approved or cleared by the [place] FDA and has been authorized for detection and/or diagnosis of SARS-CoV-2 by FDA under an Emergency Use Authorization (EUA). This EUA will remain in effect (meaning this test can be used) for the duration of the COVID-19 declaration under Section 564(b)(1) of the Act, 21 U.S.C. section 360bbb-3(b)(1), unless the authorization is terminated or revoked.  Performed at [facility], [address], [place], [place] [address]   Blood Culture (routine x 2)  Status: Abnormal   Collection Time: 06/08/23  5:29 AM   Specimen: BLOOD  Result Value Ref Range Status   Specimen Description BLOOD BLOOD RIGHT HAND  Final   Special Requests   Final    BOTTLES DRAWN AEROBIC AND ANAEROBIC Blood Culture adequate volume   Culture  Setup Time   Final    GRAM NEGATIVE RODS IN BOTH AEROBIC AND ANAEROBIC BOTTLES CRITICAL RESULT CALLED TO, READ BACK BY AND VERIFIED WITH: PHARMD AUSTIN PAYTES ON 06/08/23 @ 1952 BY DRT Performed at [facility], [address], [place], [place] [address]    Culture KLEBSIELLA PNEUMONIAE (A)  Final   Report Status 06/10/2023 FINAL  Final   Organism ID, Bacteria KLEBSIELLA PNEUMONIAE  Final      Susceptibility   Klebsiella pneumoniae - MIC*    AMPICILLIN RESISTANT Resistant     CEFEPIME <=0.12 SENSITIVE Sensitive     CEFTAZIDIME <=1 SENSITIVE Sensitive     CEFTRIAXONE <=0.25 SENSITIVE Sensitive     CIPROFLOXACIN <=0.25 SENSITIVE  Sensitive     GENTAMICIN <=1 SENSITIVE Sensitive     IMIPENEM <=0.25 SENSITIVE Sensitive     TRIMETH/SULFA <=20 SENSITIVE Sensitive     AMPICILLIN/SULBACTAM 4 SENSITIVE Sensitive     PIP/TAZO <=4 SENSITIVE Sensitive     * KLEBSIELLA PNEUMONIAE  Blood Culture ID Panel (Reflexed)     Status: Abnormal   Collection Time: 06/08/23  5:29 AM  Result Value Ref Range Status   Enterococcus faecalis NOT DETECTED NOT DETECTED Final   Enterococcus Faecium NOT DETECTED NOT DETECTED Final   Listeria monocytogenes NOT DETECTED NOT DETECTED Final   Staphylococcus species DETECTED (A) NOT DETECTED Final    Comment: CRITICAL RESULT CALLED TO, READ BACK BY AND VERIFIED WITH: PHARMD AUSTIN PAYTES ON 06/08/23 @ 1952 BY DRT    Staphylococcus aureus (BCID) NOT DETECTED NOT DETECTED Final   Staphylococcus epidermidis NOT DETECTED NOT DETECTED Final   Staphylococcus lugdunensis NOT DETECTED NOT DETECTED Final   Streptococcus species NOT DETECTED NOT DETECTED Final   Streptococcus agalactiae NOT DETECTED NOT DETECTED Final   Streptococcus pneumoniae NOT DETECTED NOT DETECTED Final   Streptococcus pyogenes NOT DETECTED NOT DETECTED Final   A.calcoaceticus-baumannii NOT DETECTED NOT DETECTED Final   Bacteroides fragilis NOT DETECTED NOT DETECTED Final   Enterobacterales DETECTED (A) NOT DETECTED Final    Comment: Enterobacterales represent a large order of gram negative bacteria, not a single organism. CRITICAL RESULT CALLED TO, READ BACK BY AND VERIFIED WITH: PHARMD AUSTIN PAYTES ON 06/08/23 @ 1952 BY DRT    Enterobacter cloacae complex NOT DETECTED NOT DETECTED Final   Escherichia coli NOT DETECTED NOT DETECTED Final   Klebsiella aerogenes NOT DETECTED NOT DETECTED Final   Klebsiella oxytoca NOT DETECTED NOT DETECTED Final   Klebsiella pneumoniae DETECTED (A) NOT DETECTED Final    Comment: CRITICAL RESULT CALLED TO, READ BACK BY AND VERIFIED WITH: PHARMD AUSTIN PAYTES ON 06/08/23 @ 1952 BY DRT    Proteus  species NOT DETECTED NOT DETECTED Final   Salmonella species NOT DETECTED NOT DETECTED Final   Serratia marcescens NOT DETECTED NOT DETECTED Final   Haemophilus influenzae NOT DETECTED NOT DETECTED Final   Neisseria meningitidis NOT DETECTED NOT DETECTED Final   Pseudomonas aeruginosa NOT DETECTED NOT DETECTED Final   Stenotrophomonas maltophilia NOT DETECTED NOT DETECTED Final   Candida albicans NOT DETECTED NOT DETECTED Final   Candida auris NOT DETECTED NOT DETECTED Final   Candida glabrata NOT DETECTED NOT DETECTED Final  Candida krusei NOT DETECTED NOT DETECTED Final   Candida parapsilosis NOT DETECTED NOT DETECTED Final   Candida tropicalis NOT DETECTED NOT DETECTED Final   Cryptococcus neoformans/gattii NOT DETECTED NOT DETECTED Final   CTX-M ESBL NOT DETECTED NOT DETECTED Final   Carbapenem resistance IMP NOT DETECTED NOT DETECTED Final   Carbapenem resistance KPC NOT DETECTED NOT DETECTED Final   Carbapenem resistance NDM NOT DETECTED NOT DETECTED Final   Carbapenem resist OXA 48 LIKE NOT DETECTED NOT DETECTED Final   Carbapenem resistance VIM NOT DETECTED NOT DETECTED Final    Comment: Performed at [facility], [address], [place], [place] [address]     Radiology Studies: No results found.  Scheduled Meds:   morphine injection  2 mg Intravenous Q8H   Continuous Infusions:  sodium chloride 20 mL/hr at 06/09/23 1748     LOS: 1 day   Burnadette Pop, MD [facility] P9/05/2023, 2:00 PM

## 2023-06-10 NOTE — Plan of Care (Signed)

## 2023-06-11 DIAGNOSIS — Z515 Encounter for palliative care: Secondary | ICD-10-CM | POA: Diagnosis not present

## 2023-06-11 DIAGNOSIS — N179 Acute kidney failure, unspecified: Secondary | ICD-10-CM | POA: Insufficient documentation

## 2023-06-11 DIAGNOSIS — R6520 Severe sepsis without septic shock: Secondary | ICD-10-CM

## 2023-06-11 DIAGNOSIS — R7881 Bacteremia: Secondary | ICD-10-CM | POA: Insufficient documentation

## 2023-06-11 DIAGNOSIS — Z7189 Other specified counseling: Secondary | ICD-10-CM | POA: Diagnosis not present

## 2023-06-11 DIAGNOSIS — A419 Sepsis, unspecified organism: Secondary | ICD-10-CM | POA: Diagnosis not present

## 2023-06-11 MED ORDER — MORPHINE SULFATE 10 MG/5ML PO SOLN: 5.0000 mg | ORAL | Status: DC | PRN

## 2023-06-11 MED ORDER — POLYVINYL ALCOHOL 1.4 % OP SOLN: 1.0000 [drp] | Freq: Four times a day (QID) | OPHTHALMIC | 0 refills | Status: DC | PRN

## 2023-06-11 MED ORDER — MORPHINE SULFATE 10 MG/5ML PO SOLN: 2.5000 mg | Freq: Three times a day (TID) | ORAL | 0 refills | Status: DC

## 2023-06-11 MED ORDER — MORPHINE SULFATE 10 MG/5ML PO SOLN: 5.0000 mg | ORAL | 0 refills | Status: DC | PRN

## 2023-06-11 MED ORDER — MORPHINE SULFATE 10 MG/5ML PO SOLN
2.5000 mg | Freq: Three times a day (TID) | ORAL | Status: DC
  Administered 2023-06-11: 2.5 mg via ORAL
  Filled 2023-06-11: qty 5

## 2023-06-11 MED ORDER — GLYCOPYRROLATE 1 MG PO TABS: 1.0000 mg | ORAL_TABLET | ORAL | Status: DC | PRN

## 2023-06-11 MED ORDER — ATROPINE SULFATE 1 % OP SOLN: 2.0000 [drp] | Freq: Three times a day (TID) | OPHTHALMIC | 12 refills | Status: DC | PRN

## 2023-06-11 MED ORDER — ONDANSETRON HCL 4 MG PO TABS: 4.0000 mg | ORAL_TABLET | Freq: Three times a day (TID) | ORAL | 0 refills | Status: DC | PRN

## 2023-06-11 NOTE — TOC Transition Note (Signed)
Transition of Care [facility]) - CM/SW Discharge Note   Patient Details  Name: Lauren Moreno MRN: [id number] Date of Birth: 29-Jul-1928  Transition of Care [facility]) CM/SW Contact:  Francesca Strome A [place], Theresia Majors Phone Number: 06/11/2023, 2:16 PM   Clinical Narrative:     Patient will DC to: [facility]  Anticipated DC date: 06/11/23  Family notified: Gene Allegretto  Transport by: Sharin Mons      Per MD patient ready for DC to [facility] . RN, patient, patient's family, and facility notified of DC. Discharge Summary and FL2 sent to facility. RN to call report prior to discharge [phone] ). DC packet on chart. Ambulance transport requested for patient.     CSW will sign off for now as social work intervention is no longer needed. Please consult [place] again if new needs arise.   Final next level of care: Skilled Nursing Facility Barriers to Discharge: No Barriers Identified   Patient Goals and CMS Choice      Discharge Placement                Patient chooses bed at: [facility] Patient to be transferred to facility by: [facility] Name of family member notified: Gene Moffet Patient and family notified of of transfer: 06/11/23  Discharge Plan and Services Additional resources added to the After Visit Summary for                                       Social Determinants of Health (SDOH) Interventions SDOH Screenings   Food Insecurity: No Food Insecurity (06/08/2023)  Housing: Low Risk  (06/08/2023)  Transportation Needs: No Transportation Needs (06/08/2023)  Utilities: Not At Risk (06/08/2023)  Tobacco Use: Low Risk  (06/08/2023)     Readmission Risk Interventions     No data to display

## 2023-06-11 NOTE — TOC Progression Note (Signed)
Transition of Care [facility]) - Progression Note    Patient Details  Name: Lauren Moreno MRN: [id number] Date of Birth: 1928-01-15  [facility] CM/SW Contact  Shaterrica Territo A [place], [place] Phone Number: 06/11/2023, 11:05 AM  Clinical Narrative:     CSW contacted French Ana at [facility] regarding discharge and hospice. CSW left HIPAA compliant vm as there was no answer. CSW to follow up at another more opportune time.   TOC will continue to follow.         Expected Discharge Plan and Services                                               Social Determinants of Health (SDOH) Interventions SDOH Screenings   Food Insecurity: No Food Insecurity (06/08/2023)  Housing: Low Risk  (06/08/2023)  Transportation Needs: No Transportation Needs (06/08/2023)  Utilities: Not At Risk (06/08/2023)  Tobacco Use: Low Risk  (06/08/2023)    Readmission Risk Interventions     No data to display

## 2023-06-11 NOTE — Progress Notes (Signed)
Report called to [facility] RN at [facility].

## 2023-06-11 NOTE — Discharge Summary (Signed)
Lauren Moreno UJW:[id number] DOB: 04-14-1928 DOA: 06/08/2023  PCP: Pcp, No  Admit date: 06/08/2023 Discharge date: 06/11/2023  Time spent: 35 minutes     Discharge Diagnoses:  Principal Problem:   Severe sepsis [facility]) Active Problems:   Bacteremia   AKI (acute kidney injury) (HCC)   Discharge Condition: stable  Diet recommendation: ad lib  There were no vitals filed for this visit.  History of present illness:  From admission h and p Lauren Moreno is a 89 y.o. female with medical history significant of hypertension, hyperlipidemia who presented to the emergency department with altered mental status.  Patient lives in a nursing facility with him was found to have hematemesis and worsening oxygenation.  She was altered the day prior with increasing respiratory requirement.  Overnight her status continued to worsen so she was brought to the ER for further assessment.  On arrival she was hypotensive with systolics in the 50s.  Labs were obtained showed creatinine 1.53, AST 506, ALT 115, bilirubin 3.6, WBC 27.1, INR 1.2, lactic acid 3.9.  Patient underwent CT abdomen/pelvis which showed distended gallbladder.  CT head showed no acute intracranial maladies.  Chest x-ray showed airspace opacities.  Patient was admitted for further workup.  Patient's family has not been on his phone does not wish to pursue pressors or aggressive measures and rather pursue comfort care.  Patient was transition to comfort care and comfort medications were administered.   Hospital Course:  Patient presents with encephalopathy. Found to have AKI, leukocytosis, lactic acidosis. W/u revealed klebsiella bacteremia. Palliative consulted, family has elected to pursue comfort care and so patient transitioned to full comfort care. Discharged to her skilled nursing facility with hospice to follow.   Procedures: none   Consultations: palliative  Discharge Exam: Vitals:   06/10/23 0721 06/11/23 0901  BP: (!) 99/48  (!) 109/56  Pulse: (!) 102 86  Resp:  16  Temp: 97.8 F (36.6 C) 98.5 F (36.9 C)  SpO2: 90% 100%    General: NAD Cardiovascular: rrr Respiratory: normal WOB  Discharge Instructions   Discharge Instructions     Diet general   Complete by: As directed    Increase activity slowly   Complete by: As directed       Allergies as of 06/11/2023       Reactions   Penicillins Anaphylaxis   Did it involve swelling of the face/tongue/throat, SOB, or low BP? Yes Did it involve sudden or severe rash/hives, skin peeling, or any reaction on the inside of your mouth or nose? No Did you need to seek medical attention at a hospital or doctor's office? Yes When did it last happen?     young adult or middle aged adult If all above answers are "NO", may proceed with cephalosporin use.   Shrimp [shellfish Allergy] Anaphylaxis        Medication List     STOP taking these medications    Ativan 2 MG/ML injection Generic drug: LORazepam   carboxymethylcellulose 1 % ophthalmic solution   diclofenac Sodium 1 % Gel Commonly known as: VOLTAREN   gabapentin 100 MG capsule Commonly known as: NEURONTIN   lamoTRIgine 25 MG tablet Commonly known as: LAMICTAL   metoprolol succinate 25 MG 24 hr tablet Commonly known as: Toprol XL   nitroGLYCERIN 0.4 MG SL tablet Commonly known as: NITROSTAT   NUTRITIONAL SUPPLEMENT PO   polyethylene glycol 17 g packet Commonly known as: MiraLax   potassium chloride 10 MEQ tablet Commonly known as:  KLOR-CON   PRO-STAT PO   QUEtiapine 25 MG tablet Commonly known as: SEROQUEL   sennosides-docusate sodium 8.6-50 MG tablet Commonly known as: SENOKOT-S   Systane Balance 0.6 % Soln Generic drug: Propylene Glycol   Torsemide 40 MG Tabs   traMADol 50 MG tablet Commonly known as: ULTRAM       TAKE these medications    acetaminophen 650 MG CR tablet Commonly known as: TYLENOL Take 650 mg by mouth 3 (three) times daily.   atropine 1 %  ophthalmic solution Place 2 drops under the tongue 3 (three) times daily as needed (secretions).   glycopyrrolate 1 MG tablet Commonly known as: ROBINUL Take 1 tablet (1 mg total) by mouth every 4 (four) hours as needed (excessive secretions).   morphine 10 MG/5ML solution Take 1.3 mLs (2.6 mg total) by mouth every 8 (eight) hours.   morphine 10 MG/5ML solution Take 2.5 mLs (5 mg total) by mouth every 4 (four) hours as needed for severe pain.   ondansetron 4 MG tablet Commonly known as: Zofran Take 1 tablet (4 mg total) by mouth every 8 (eight) hours as needed for nausea or vomiting.   polyvinyl alcohol 1.4 % ophthalmic solution Commonly known as: LIQUIFILM TEARS Place 1 drop into both eyes 4 (four) times daily as needed for dry eyes.       Allergies  Allergen Reactions   Penicillins Anaphylaxis    Did it involve swelling of the face/tongue/throat, SOB, or low BP? Yes Did it involve sudden or severe rash/hives, skin peeling, or any reaction on the inside of your mouth or nose? No Did you need to seek medical attention at a hospital or doctor's office? Yes When did it last happen?     young adult or middle aged adult If all above answers are "NO", may proceed with cephalosporin use.    Shrimp [Shellfish Allergy] Anaphylaxis    Contact information for after-discharge care     Destination     [facility] Preferred SNF .   Service: Skilled Nursing Contact information: [address] [place] [address] [phone]                      The results of significant diagnostics from this hospitalization (including imaging, microbiology, ancillary and laboratory) are listed below for reference.    Significant Diagnostic Studies: CT ABDOMEN PELVIS WO CONTRAST  Result Date: 06/08/2023 CLINICAL DATA:  89 year old female with history of sepsis. EXAM: CT ABDOMEN AND PELVIS WITHOUT CONTRAST TECHNIQUE: Multidetector CT imaging  of the abdomen and pelvis was performed following the standard protocol without IV contrast. RADIATION DOSE REDUCTION: This exam was performed according to the departmental dose-optimization program which includes automated exposure control, adjustment of the mA and/or kV according to patient size and/or use of iterative reconstruction technique. COMPARISON:  None Available. FINDINGS: Lower chest: Small bilateral pleural effusions. Dependent opacities in the lower lobes of the lungs bilaterally which likely reflect a combination of atelectasis and airspace consolidation. Hepatobiliary: No definite suspicious cystic or solid hepatic lesions are confidently identified on today's noncontrast CT examination. Gallbladder is moderately distended. There is some intermediate attenuation material lying dependently in the gallbladder, likely reflective of biliary sludge. No pericholecystic fluid or inflammatory changes confidently identified. Pancreas: No definite pancreatic mass or peripancreatic fluid collections or inflammatory changes noted on today's noncontrast CT examination. Spleen: Unremarkable. Adrenals/Urinary Tract: Mild bilateral renal atrophy. Unenhanced appearance of the kidneys and bilateral adrenal glands is  otherwise unremarkable. No hydroureteronephrosis. Urinary bladder is nearly completely decompressed. There is a small amount of gas non dependently in the lumen of the urinary bladder. Additionally, there is a small amount of high attenuation material in the base of the bladder near the urethral orifice (axial image 79 of series 3) measuring 1.9 x 1.0 cm. Stomach/Bowel: The appearance of the stomach is unremarkable. No pathologic dilatation of small bowel or colon. Numerous colonic diverticuli are noted, without definite focal surrounding inflammatory changes to clearly indicate an acute diverticulitis at this time. The appendix is not confidently identified and may be surgically absent. Regardless, there  are no inflammatory changes noted adjacent to the cecum to suggest the presence of an acute appendicitis at this time. Vascular/Lymphatic: Atherosclerosis in the abdominal aorta and pelvic vasculature. No lymphadenopathy noted in the abdomen or pelvis. Reproductive: Status post hysterectomy. Low-attenuation lesions associated with both ovaries, largest of which on the right measures 3.6 x 2.6 cm (axial image 65 of series 3). Other: No significant volume of ascites.  No pneumoperitoneum. Musculoskeletal: In the inferior aspect of the right breast (axial image 30 of series 3) there is a 1.5 x 1.3 cm soft tissue attenuation nodule, nonspecific. There are no aggressive appearing lytic or blastic lesions noted in the visualized portions of the skeleton. Postoperative changes of ORIF are noted in the left proximal femur, incompletely imaged. IMPRESSION: 1. Gallbladder is moderately distended. There is some intermediate attenuation material lying dependently within the gallbladder. No overt pericholecystic fluid or surrounding inflammatory changes. If there is clinical concern for acute cholecystitis, further evaluation with right upper quadrant abdominal ultrasound should be considered. 2. Tiny amount of gas non dependently in the lumen of the urinary bladder. This could be iatrogenic if there has been recent catheterization for urinalysis. In the absence of a history of recent catheterization, correlation with urinalysis would be strongly recommended to exclude the possibility of urinary tract infection with gas-forming organisms. There is also a small amount of high attenuation material in the base of the urinary bladder adjacent to the urethral orifice. This is of uncertain etiology and significance. If there has been recent catheterization, this could represent a small amount of blood products. Alternatively, a partially calcified bladder stone could have this appearance. 3. Bibasilar opacities in the lungs likely  reflective of a combination of atelectasis and consolidation. Clinical correlation for signs and symptoms of recent aspiration is suggested. There also trace bilateral pleural effusions. 4. Colonic diverticulosis without evidence of acute diverticulitis at this time. 5. Simple appearing cyst in the right ovary, as above. Follow-up pelvic ultrasound should be considered in 6-12 months to ensure stability. 6. Small soft tissue attenuation nodule in the inferior aspect of the right breast, nonspecific. Correlation with nonemergent outpatient mammography could be considered if clinically appropriate. Electronically Signed   By: Trudie Reed M.D.   On: 06/08/2023 06:36   CT Head Wo Contrast  Result Date: 06/08/2023 CLINICAL DATA:  Mental status change with unknown cause EXAM: CT HEAD WITHOUT CONTRAST TECHNIQUE: Contiguous axial images were obtained from the base of the skull through the vertex without intravenous contrast. RADIATION DOSE REDUCTION: This exam was performed according to the departmental dose-optimization program which includes automated exposure control, adjustment of the mA and/or kV according to patient size and/or use of iterative reconstruction technique. COMPARISON:  07/05/2019 FINDINGS: Brain: No evidence of acute infarction, hemorrhage, obstructive hydrocephalus, extra-axial collection or mass lesion/mass effect. Mental status change with brain atrophy which generalized ventriculomegaly. There is  some degree of disproportionate subarachnoid spaces on coronal images but the overall pattern and stability is most consistent with atrophy. Vascular: No hyperdense vessel or unexpected calcification. Skull: Normal. Negative for fracture or focal lesion. Sinuses/Orbits: No acute finding. IMPRESSION: 1. No acute or interval finding. 2. Generalized atrophy. Electronically Signed   By: Tiburcio Pea M.D.   On: 06/08/2023 05:59   DG Chest Port 1 View  Result Date: 06/08/2023 CLINICAL DATA:  Possible  sepsis. Elderly patient found at nursing home with altered mental status. EXAM: PORTABLE CHEST 1 VIEW COMPARISON:  Most recent prior chest x-ray 07/05/2019 FINDINGS: Limited frontal radiograph in expiratory phase. The lung volumes are very low. Patchy airspace opacities in the right perihilar region, left perihilar region and left lung base. Background of chronic bronchitic changes. Cardiopericardial silhouette is not well seen due to the very low lung volumes. Chronic degenerative changes of the left shoulder joint. IMPRESSION: 1. Very limited frontal view of the chest in the expiratory phase of respiration resulting in very low lung volumes and limited utility. 2. Patchy airspace opacities in the bilateral hilar regions and left lower lobe may reflect areas of atelectasis or pneumonia. Recommend further evaluation with dedicated PA and lateral chest x-ray when the patient is able. Electronically Signed   By: Malachy Moan M.D.   On: 06/08/2023 05:25    Microbiology: Recent Results (from the past 240 hour(s))  Blood Culture (routine x 2)     Status: Abnormal   Collection Time: 06/08/23  5:00 AM   Specimen: BLOOD RIGHT HAND  Result Value Ref Range Status   Specimen Description BLOOD RIGHT HAND  Final   Special Requests   Final    BOTTLES DRAWN AEROBIC AND ANAEROBIC Blood Culture adequate volume   Culture  Setup Time   Final    GRAM NEGATIVE RODS IN BOTH AEROBIC AND ANAEROBIC BOTTLES CRITICAL VALUE NOTED.  VALUE IS CONSISTENT WITH PREVIOUSLY REPORTED AND CALLED VALUE.    Culture (A)  Final    KLEBSIELLA PNEUMONIAE SUSCEPTIBILITIES PERFORMED ON PREVIOUS CULTURE WITHIN THE LAST 5 DAYS. Performed at [facility], [address], [place], [place] [address]    Report Status 06/10/2023 FINAL  Final  Resp panel by RT-PCR (RSV, Flu A&B, Covid) Anterior Nasal Swab     Status: None   Collection Time: 06/08/23  5:01 AM   Specimen: Anterior Nasal Swab  Result Value Ref Range Status   SARS  Coronavirus 2 by RT PCR NEGATIVE NEGATIVE Final   Influenza A by PCR NEGATIVE NEGATIVE Final   Influenza B by PCR NEGATIVE NEGATIVE Final    Comment: (NOTE) The Xpert Xpress SARS-CoV-2/FLU/RSV plus assay is intended as an aid in the diagnosis of influenza from Nasopharyngeal swab specimens and should not be used as a sole basis for treatment. Nasal washings and aspirates are unacceptable for Xpert Xpress SARS-CoV-2/FLU/RSV testing.  Fact Sheet for Patients: BloggerCourse.com  Fact Sheet for Healthcare Providers: SeriousBroker.it  This test is not yet approved or cleared by the [place] FDA and has been authorized for detection and/or diagnosis of SARS-CoV-2 by FDA under an Emergency Use Authorization (EUA). This EUA will remain in effect (meaning this test can be used) for the duration of the COVID-19 declaration under Section 564(b)(1) of the Act, 21 U.S.C. section 360bbb-3(b)(1), unless the authorization is terminated or revoked.     Resp Syncytial Virus by PCR NEGATIVE NEGATIVE Final    Comment: (NOTE) Fact Sheet for Patients: BloggerCourse.com  Fact Sheet  for Healthcare Providers: SeriousBroker.it  This test is not yet approved or cleared by the [place] and has been authorized for detection and/or diagnosis of SARS-CoV-2 by FDA under an Emergency Use Authorization (EUA). This EUA will remain in effect (meaning this test can be used) for the duration of the COVID-19 declaration under Section 564(b)(1) of the Act, 21 U.S.C. section 360bbb-3(b)(1), unless the authorization is terminated or revoked.  Performed at [facility], [address], [place], [place] [address]   Blood Culture (routine x 2)     Status: Abnormal   Collection Time: 06/08/23  5:29 AM   Specimen: BLOOD  Result Value Ref Range Status   Specimen Description BLOOD BLOOD RIGHT HAND   Final   Special Requests   Final    BOTTLES DRAWN AEROBIC AND ANAEROBIC Blood Culture adequate volume   Culture  Setup Time   Final    GRAM NEGATIVE RODS IN BOTH AEROBIC AND ANAEROBIC BOTTLES CRITICAL RESULT CALLED TO, READ BACK BY AND VERIFIED WITH: PHARMD AUSTIN PAYTES ON 06/08/23 @ 1952 BY DRT Performed at [facility], [address], [place], [place] [address]    Culture KLEBSIELLA PNEUMONIAE (A)  Final   Report Status 06/10/2023 FINAL  Final   Organism ID, Bacteria KLEBSIELLA PNEUMONIAE  Final      Susceptibility   Klebsiella pneumoniae - MIC*    AMPICILLIN RESISTANT Resistant     CEFEPIME <=0.12 SENSITIVE Sensitive     CEFTAZIDIME <=1 SENSITIVE Sensitive     CEFTRIAXONE <=0.25 SENSITIVE Sensitive     CIPROFLOXACIN <=0.25 SENSITIVE Sensitive     GENTAMICIN <=1 SENSITIVE Sensitive     IMIPENEM <=0.25 SENSITIVE Sensitive     TRIMETH/SULFA <=20 SENSITIVE Sensitive     AMPICILLIN/SULBACTAM 4 SENSITIVE Sensitive     PIP/TAZO <=4 SENSITIVE Sensitive     * KLEBSIELLA PNEUMONIAE  Blood Culture ID Panel (Reflexed)     Status: Abnormal   Collection Time: 06/08/23  5:29 AM  Result Value Ref Range Status   Enterococcus faecalis NOT DETECTED NOT DETECTED Final   Enterococcus Faecium NOT DETECTED NOT DETECTED Final   Listeria monocytogenes NOT DETECTED NOT DETECTED Final   Staphylococcus species DETECTED (A) NOT DETECTED Final    Comment: CRITICAL RESULT CALLED TO, READ BACK BY AND VERIFIED WITH: PHARMD AUSTIN PAYTES ON 06/08/23 @ 1952 BY DRT    Staphylococcus aureus (BCID) NOT DETECTED NOT DETECTED Final   Staphylococcus epidermidis NOT DETECTED NOT DETECTED Final   Staphylococcus lugdunensis NOT DETECTED NOT DETECTED Final   Streptococcus species NOT DETECTED NOT DETECTED Final   Streptococcus agalactiae NOT DETECTED NOT DETECTED Final   Streptococcus pneumoniae NOT DETECTED NOT DETECTED Final   Streptococcus pyogenes NOT DETECTED NOT DETECTED Final   A.calcoaceticus-baumannii  NOT DETECTED NOT DETECTED Final   Bacteroides fragilis NOT DETECTED NOT DETECTED Final   Enterobacterales DETECTED (A) NOT DETECTED Final    Comment: Enterobacterales represent a large order of gram negative bacteria, not a single organism. CRITICAL RESULT CALLED TO, READ BACK BY AND VERIFIED WITH: PHARMD AUSTIN PAYTES ON 06/08/23 @ 1952 BY DRT    Enterobacter cloacae complex NOT DETECTED NOT DETECTED Final   Escherichia coli NOT DETECTED NOT DETECTED Final   Klebsiella aerogenes NOT DETECTED NOT DETECTED Final   Klebsiella oxytoca NOT DETECTED NOT DETECTED Final   Klebsiella pneumoniae DETECTED (A) NOT DETECTED Final    Comment: CRITICAL RESULT CALLED TO, READ BACK BY AND VERIFIED WITH: PHARMD AUSTIN PAYTES ON  06/08/23 @ 1952 BY DRT    Proteus species NOT DETECTED NOT DETECTED Final   Salmonella species NOT DETECTED NOT DETECTED Final   Serratia marcescens NOT DETECTED NOT DETECTED Final   Haemophilus influenzae NOT DETECTED NOT DETECTED Final   Neisseria meningitidis NOT DETECTED NOT DETECTED Final   Pseudomonas aeruginosa NOT DETECTED NOT DETECTED Final   Stenotrophomonas maltophilia NOT DETECTED NOT DETECTED Final   Candida albicans NOT DETECTED NOT DETECTED Final   Candida auris NOT DETECTED NOT DETECTED Final   Candida glabrata NOT DETECTED NOT DETECTED Final   Candida krusei NOT DETECTED NOT DETECTED Final   Candida parapsilosis NOT DETECTED NOT DETECTED Final   Candida tropicalis NOT DETECTED NOT DETECTED Final   Cryptococcus neoformans/gattii NOT DETECTED NOT DETECTED Final   CTX-M ESBL NOT DETECTED NOT DETECTED Final   Carbapenem resistance IMP NOT DETECTED NOT DETECTED Final   Carbapenem resistance KPC NOT DETECTED NOT DETECTED Final   Carbapenem resistance NDM NOT DETECTED NOT DETECTED Final   Carbapenem resist OXA 48 LIKE NOT DETECTED NOT DETECTED Final   Carbapenem resistance VIM NOT DETECTED NOT DETECTED Final    Comment: Performed at [facility], [address] [place], [place] [address]     Labs: Basic Metabolic Panel: Recent Labs  Lab 06/08/23 0502  NA 138  K 3.5  CL 101  CO2 24  GLUCOSE 86  BUN 16  CREATININE 1.53*  CALCIUM 8.3*   Liver Function Tests: Recent Labs  Lab 06/08/23 0502  AST 506*  ALT 115*  ALKPHOS 410*  BILITOT 3.6*  PROT 6.1*  ALBUMIN <1.5*   No results for input(s): "LIPASE", "AMYLASE" in the last 168 hours. No results for input(s): "AMMONIA" in the last 168 hours. CBC: Recent Labs  Lab 06/08/23 0502  WBC 27.1*  NEUTROABS 24.6*  HGB 13.3  HCT 41.2  MCV 103.5*  PLT 312   Cardiac Enzymes: No results for input(s): "CKTOTAL", "CKMB", "CKMBINDEX", "TROPONINI" in the last 168 hours. BNP: BNP (last 3 results) No results for input(s): "BNP" in the last 8760 hours.  ProBNP (last 3 results) No results for input(s): "PROBNP" in the last 8760 hours.  CBG: No results for input(s): "GLUCAP" in the last 168 hours.     Signed:  Silvano Bilis MD.  [facility] 06/11/2023, 12:55 PM

## 2023-06-11 NOTE — Progress Notes (Signed)
   Palliative Medicine Inpatient Follow Up Note HPI:  Patient is a 89 year old female with history of hypertension, hyperlipidemia,CHF  who was brought from [facility] nursing facility with complaint of altered mental status.  She was also found to have hematemesis, hypoxic. Family did not want to pursue aggressive treatment course and opted for comfort care. Currently on  full comfort care.  Anticipated hospital death.  Palliative care consulted to support symptom management.   Today's Discussion 06/11/2023  *Please note that this is a verbal dictation therefore any spelling or grammatical errors are due to the "Dragon Medical One" system interpretation.  Chart reviewed inclusive of vital signs, progress notes, laboratory results, and diagnostic images.   I met with Margan at bedside this afternoon. She was noted to be somnolent though appeared in NAD.  I spoke with Shianne's daughter Dois Davenport. I shared that Mykisha appears stable and at this juncture we are able to consider her transitioning to another setting from the hospital. She shares that after speaking to her family they do not desire [facility]. We further discussed Maelena going back to [facility]. Patients daughter shares her brother is speaking to them this morning.   I spoke to Chaela's son, Gene. Gene states that he went to [facility] this morning and from their perspective they are happy to take [place] back. We discussed her going back with hospice care. I re-emphasized that Teniya is not recovered though she is stable to continued comfort measures at her home facility.   Questions and concerns addressed/Palliative Support Provided.   Objective Assessment: Vital Signs Vitals:   06/10/23 0721 06/11/23 0901  BP: (!) 99/48 (!) 109/56  Pulse: (!) 102 86  Resp:  16  Temp: 97.8 F (36.6 C) 98.5 F (36.9 C)  SpO2: 90% 100%    Intake/Output Summary (Last 24 hours) at 06/11/2023 1249 Last data filed at 06/11/2023 0413 Gross per 24  hour  Intake 344.04 ml  Output --  Net 344.04 ml   Gen: Elderly Caucasian female in no acute distress HEENT: Dry  mucous membranes CV: Regular rate and rhythm  PULM: On 2 L nasal cannula breathing is even and nonlabored ABD: soft/nontender  EXT: Bilateral lower extremity edema Neuro: Somnolent  SUMMARY OF RECOMMENDATIONS   DNAR/DNI   Comfort focused care   Plan for discharge back to [facility] this afternoon with initiation of [facility]   Billing based on MDM: High ______________________________________________________________________________________ Lamarr Lulas [facility] Palliative Medicine Team Team Cell Phone: [phone] Please utilize secure chat with additional questions, if there is no response within 30 minutes please call the above phone number  Palliative Medicine Team providers are available by phone from 7am to 7pm daily and can be reached through the team cell phone.  Should this patient require assistance outside of these hours, please call the patient's attending physician.

## 2023-07-03 DEATH — deceased
